# Patient Record
Sex: Female | Born: 1965 | Race: Black or African American | Hispanic: No | Smoking: Never smoker
Health system: Southern US, Community
[De-identification: ages and names within clinical notes are randomized; demographics above are authoritative.]

## PROBLEM LIST (undated history)

## (undated) DIAGNOSIS — K219 Gastro-esophageal reflux disease without esophagitis: Secondary | ICD-10-CM

## (undated) DIAGNOSIS — I5181 Takotsubo syndrome: Secondary | ICD-10-CM

## (undated) DIAGNOSIS — D649 Anemia, unspecified: Secondary | ICD-10-CM

## (undated) DIAGNOSIS — Z5189 Encounter for other specified aftercare: Secondary | ICD-10-CM

## (undated) DIAGNOSIS — I10 Essential (primary) hypertension: Secondary | ICD-10-CM

## (undated) DIAGNOSIS — I214 Non-ST elevation (NSTEMI) myocardial infarction: Secondary | ICD-10-CM

## (undated) DIAGNOSIS — I509 Heart failure, unspecified: Secondary | ICD-10-CM

## (undated) DIAGNOSIS — E785 Hyperlipidemia, unspecified: Secondary | ICD-10-CM

## (undated) DIAGNOSIS — R079 Chest pain, unspecified: Secondary | ICD-10-CM

## (undated) DIAGNOSIS — R7303 Prediabetes: Secondary | ICD-10-CM

## (undated) DIAGNOSIS — F419 Anxiety disorder, unspecified: Secondary | ICD-10-CM

## (undated) HISTORY — PX: TUBAL LIGATION: SHX77

## (undated) HISTORY — DX: Essential (primary) hypertension: I10

## (undated) HISTORY — PX: COLONOSCOPY: SHX174

## (undated) HISTORY — DX: Chest pain, unspecified: R07.9

## (undated) HISTORY — PX: BACK SURGERY: SHX140

## (undated) HISTORY — PX: ABDOMINAL HYSTERECTOMY: SHX81

## (undated) HISTORY — DX: Anxiety disorder, unspecified: F41.9

## (undated) HISTORY — DX: Anemia, unspecified: D64.9

## (undated) HISTORY — DX: Hyperlipidemia, unspecified: E78.5

## (undated) HISTORY — DX: Encounter for other specified aftercare: Z51.89

## (undated) HISTORY — DX: Takotsubo syndrome: I51.81

---

## 1988-04-08 HISTORY — PX: TUBAL LIGATION: SHX77

## 2000-04-08 HISTORY — PX: BACK SURGERY: SHX140

## 2002-08-16 ENCOUNTER — Emergency Department (HOSPITAL_COMMUNITY): Admission: AC | Admit: 2002-08-16 | Discharge: 2002-08-17 | Payer: Self-pay

## 2002-08-16 ENCOUNTER — Encounter: Payer: Self-pay | Admitting: Emergency Medicine

## 2002-08-25 ENCOUNTER — Encounter: Admission: RE | Admit: 2002-08-25 | Discharge: 2002-11-19 | Payer: Self-pay | Admitting: Specialist

## 2002-10-20 ENCOUNTER — Other Ambulatory Visit: Admission: RE | Admit: 2002-10-20 | Discharge: 2002-10-20 | Payer: Self-pay | Admitting: Obstetrics and Gynecology

## 2003-11-28 ENCOUNTER — Other Ambulatory Visit: Admission: RE | Admit: 2003-11-28 | Discharge: 2003-11-28 | Payer: Self-pay | Admitting: Obstetrics and Gynecology

## 2004-09-27 ENCOUNTER — Other Ambulatory Visit: Admission: RE | Admit: 2004-09-27 | Discharge: 2004-09-27 | Payer: Self-pay | Admitting: Obstetrics and Gynecology

## 2005-04-08 HISTORY — PX: COLONOSCOPY: SHX174

## 2005-06-04 ENCOUNTER — Encounter (HOSPITAL_COMMUNITY): Admission: RE | Admit: 2005-06-04 | Discharge: 2005-06-05 | Payer: Self-pay | Admitting: Gastroenterology

## 2005-10-16 ENCOUNTER — Ambulatory Visit: Payer: Self-pay | Admitting: Oncology

## 2005-11-05 LAB — CBC WITH DIFFERENTIAL/PLATELET
BASO%: 0.2 % (ref 0.0–2.0)
Eosinophils Absolute: 0.1 10*3/uL (ref 0.0–0.5)
HCT: 28.9 % — ABNORMAL LOW (ref 34.8–46.6)
HGB: 8.8 g/dL — ABNORMAL LOW (ref 11.6–15.9)
MCH: 19.9 pg — ABNORMAL LOW (ref 26.0–34.0)
MCHC: 30.4 g/dL — ABNORMAL LOW (ref 32.0–36.0)
MCV: 65.6 fL — ABNORMAL LOW (ref 81.0–101.0)
RBC: 4.4 10*6/uL (ref 3.70–5.32)
RDW: 18 % — ABNORMAL HIGH (ref 11.3–14.5)
lymph#: 3 10*3/uL (ref 0.9–3.3)

## 2005-11-05 LAB — FERRITIN: Ferritin: <1 ng/mL — ABNORMAL LOW (ref 10–291)

## 2005-11-05 LAB — CHCC SMEAR

## 2005-11-05 LAB — COMPREHENSIVE METABOLIC PANEL
BUN: 13 mg/dL (ref 6–23)
CO2: 25 mEq/L (ref 19–32)
Chloride: 106 mEq/L (ref 96–112)
Potassium: 3.8 mEq/L (ref 3.5–5.3)
Sodium: 137 mEq/L (ref 135–145)
Total Bilirubin: 0.3 mg/dL (ref 0.3–1.2)

## 2005-11-05 LAB — IRON AND TIBC: UIBC: 410 ug/dL

## 2005-11-05 LAB — LACTATE DEHYDROGENASE: LDH: 152 U/L (ref 94–250)

## 2005-11-06 ENCOUNTER — Other Ambulatory Visit: Admission: RE | Admit: 2005-11-06 | Discharge: 2005-11-06 | Payer: Self-pay | Admitting: Gynecology

## 2006-01-01 ENCOUNTER — Ambulatory Visit: Payer: Self-pay | Admitting: Oncology

## 2008-06-13 ENCOUNTER — Ambulatory Visit (HOSPITAL_COMMUNITY): Admission: RE | Admit: 2008-06-13 | Discharge: 2008-06-13 | Payer: Self-pay | Admitting: Obstetrics & Gynecology

## 2008-07-29 ENCOUNTER — Inpatient Hospital Stay (HOSPITAL_COMMUNITY): Admission: AD | Admit: 2008-07-29 | Discharge: 2008-07-29 | Payer: Self-pay | Admitting: Obstetrics & Gynecology

## 2008-08-12 ENCOUNTER — Inpatient Hospital Stay (HOSPITAL_COMMUNITY): Admission: AD | Admit: 2008-08-12 | Discharge: 2008-08-12 | Payer: Self-pay | Admitting: Obstetrics

## 2008-09-12 ENCOUNTER — Inpatient Hospital Stay (HOSPITAL_COMMUNITY): Admission: AD | Admit: 2008-09-12 | Discharge: 2008-09-12 | Payer: Self-pay | Admitting: Obstetrics

## 2008-10-18 ENCOUNTER — Emergency Department (HOSPITAL_COMMUNITY): Admission: EM | Admit: 2008-10-18 | Discharge: 2008-10-18 | Payer: Self-pay | Admitting: Emergency Medicine

## 2009-02-17 ENCOUNTER — Inpatient Hospital Stay (HOSPITAL_COMMUNITY): Admission: RE | Admit: 2009-02-17 | Discharge: 2009-02-20 | Payer: Self-pay | Admitting: Obstetrics

## 2009-02-17 ENCOUNTER — Encounter: Payer: Self-pay | Admitting: Obstetrics

## 2009-12-01 ENCOUNTER — Ambulatory Visit (HOSPITAL_COMMUNITY): Admission: RE | Admit: 2009-12-01 | Discharge: 2009-12-01 | Payer: Self-pay | Admitting: Obstetrics

## 2010-04-29 ENCOUNTER — Encounter: Payer: Self-pay | Admitting: Obstetrics

## 2010-07-11 LAB — CBC
HCT: 34.7 % — ABNORMAL LOW (ref 36.0–46.0)
Hemoglobin: 10.8 g/dL — ABNORMAL LOW (ref 12.0–15.0)
MCHC: 31 g/dL (ref 30.0–36.0)
MCHC: 31.2 g/dL (ref 30.0–36.0)
MCV: 74.4 fL — ABNORMAL LOW (ref 78.0–100.0)
Platelets: 400 10*3/uL (ref 150–400)
RBC: 4.56 MIL/uL (ref 3.87–5.11)
RDW: 26.3 % — ABNORMAL HIGH (ref 11.5–15.5)
WBC: 6.6 10*3/uL (ref 4.0–10.5)
WBC: 9.5 10*3/uL (ref 4.0–10.5)

## 2010-07-11 LAB — PREGNANCY, URINE: Preg Test, Ur: NEGATIVE

## 2010-10-15 ENCOUNTER — Other Ambulatory Visit: Payer: Self-pay | Admitting: Obstetrics

## 2010-10-15 DIAGNOSIS — Z1231 Encounter for screening mammogram for malignant neoplasm of breast: Secondary | ICD-10-CM

## 2010-12-04 ENCOUNTER — Ambulatory Visit (HOSPITAL_COMMUNITY): Payer: 59 | Attending: Obstetrics

## 2011-07-15 ENCOUNTER — Ambulatory Visit (HOSPITAL_COMMUNITY)
Admission: RE | Admit: 2011-07-15 | Discharge: 2011-07-15 | Disposition: A | Discharge status: Home / Self Care | Payer: 59 | Source: Ambulatory Visit | Attending: Obstetrics | Admitting: Obstetrics

## 2011-07-15 DIAGNOSIS — Z1231 Encounter for screening mammogram for malignant neoplasm of breast: Secondary | ICD-10-CM

## 2011-08-15 ENCOUNTER — Other Ambulatory Visit: Payer: Self-pay

## 2011-08-16 ENCOUNTER — Other Ambulatory Visit: Payer: Self-pay | Admitting: Obstetrics

## 2012-04-08 DIAGNOSIS — Z5189 Encounter for other specified aftercare: Secondary | ICD-10-CM

## 2012-04-08 HISTORY — DX: Encounter for other specified aftercare: Z51.89

## 2012-09-15 ENCOUNTER — Ambulatory Visit: Payer: Self-pay | Admitting: Obstetrics

## 2012-12-16 ENCOUNTER — Ambulatory Visit (INDEPENDENT_AMBULATORY_CARE_PROVIDER_SITE_OTHER): Payer: 59 | Admitting: Obstetrics

## 2012-12-16 ENCOUNTER — Encounter: Payer: Self-pay | Admitting: Obstetrics

## 2012-12-16 VITALS — BP 145/96 | HR 87 | Temp 97.5°F | Ht 68.0 in | Wt 279.0 lb

## 2012-12-16 DIAGNOSIS — N760 Acute vaginitis: Secondary | ICD-10-CM

## 2012-12-16 DIAGNOSIS — B9689 Other specified bacterial agents as the cause of diseases classified elsewhere: Secondary | ICD-10-CM

## 2012-12-16 DIAGNOSIS — Z01419 Encounter for gynecological examination (general) (routine) without abnormal findings: Secondary | ICD-10-CM

## 2012-12-16 DIAGNOSIS — Z1239 Encounter for other screening for malignant neoplasm of breast: Secondary | ICD-10-CM

## 2012-12-16 DIAGNOSIS — F419 Anxiety disorder, unspecified: Secondary | ICD-10-CM

## 2012-12-16 DIAGNOSIS — Z113 Encounter for screening for infections with a predominantly sexual mode of transmission: Secondary | ICD-10-CM

## 2012-12-16 DIAGNOSIS — F411 Generalized anxiety disorder: Secondary | ICD-10-CM

## 2012-12-16 DIAGNOSIS — A499 Bacterial infection, unspecified: Secondary | ICD-10-CM

## 2012-12-16 DIAGNOSIS — E669 Obesity, unspecified: Secondary | ICD-10-CM

## 2012-12-16 MED ORDER — METRONIDAZOLE 0.75 % VA GEL: 1.0000 | Freq: Two times a day (BID) | VAGINAL | Status: DC

## 2012-12-16 MED ORDER — ALPRAZOLAM 0.5 MG PO TABS: 0.5000 mg | ORAL_TABLET | Freq: Two times a day (BID) | ORAL | Status: DC | PRN

## 2012-12-16 MED ORDER — PHENTERMINE HCL 37.5 MG PO CAPS: 37.5000 mg | ORAL_CAPSULE | ORAL | Status: DC

## 2012-12-16 NOTE — Progress Notes (Signed)
Subjective:     Diana Campos is a 47 y.o. female here for a routine exam.  Current complaints: annual exam.  Personal health questionnaire reviewed: yes.   Gynecologic History No LMP recorded. Patient has had a hysterectomy. Contraception: none Last Pap: 07/2011. Results were: abnormal LGSIL Last mammogram: 2012. Results were: normal  Obstetric History OB History  No data available     The following portions of the patient's history were reviewed and updated as appropriate: allergies, current medications, past family history, past medical history, past social history, past surgical history and problem list.  Review of Systems Pertinent items are noted in HPI.    Objective:    General appearance: alert and no distress Breasts: normal appearance, no masses or tenderness Abdomen: normal findings: soft, non-tender Pelvic: cervix normal in appearance, external genitalia normal, no adnexal masses or tenderness, no cervical motion tenderness, uterus surgically absent and vagina normal without discharge Extremities: extremities normal, atraumatic, no cyanosis or edema    Assessment:    Healthy female exam.   S/P Supracervical Hysterectomy for fibroids.  H/O BV  Anxiety   Follow up in: 1 year.

## 2012-12-16 NOTE — Addendum Note (Signed)
Addended by: Coral Ceo A on: 12/16/2012 01:27 PM   Modules accepted: Orders

## 2012-12-17 LAB — RPR

## 2012-12-17 LAB — HEPATITIS C ANTIBODY: HCV Ab: NEGATIVE

## 2012-12-17 LAB — HIV ANTIBODY (ROUTINE TESTING W REFLEX): HIV: NONREACTIVE

## 2012-12-28 ENCOUNTER — Other Ambulatory Visit (HOSPITAL_COMMUNITY): Payer: Self-pay | Admitting: Obstetrics

## 2012-12-28 DIAGNOSIS — Z1239 Encounter for other screening for malignant neoplasm of breast: Secondary | ICD-10-CM

## 2013-01-07 ENCOUNTER — Other Ambulatory Visit: Payer: Self-pay | Admitting: *Deleted

## 2013-01-07 DIAGNOSIS — B373 Candidiasis of vulva and vagina: Secondary | ICD-10-CM

## 2013-01-07 MED ORDER — FLUCONAZOLE 150 MG PO TABS: 150.0000 mg | ORAL_TABLET | Freq: Once | ORAL | Status: DC

## 2013-01-12 ENCOUNTER — Encounter: Payer: Self-pay | Admitting: Obstetrics

## 2013-01-15 ENCOUNTER — Ambulatory Visit (HOSPITAL_COMMUNITY): Payer: 59

## 2013-01-22 ENCOUNTER — Ambulatory Visit (HOSPITAL_COMMUNITY)
Admission: RE | Admit: 2013-01-22 | Discharge: 2013-01-22 | Disposition: A | Discharge status: Home / Self Care | Payer: 59 | Source: Ambulatory Visit | Attending: Obstetrics | Admitting: Obstetrics

## 2013-01-22 DIAGNOSIS — Z1231 Encounter for screening mammogram for malignant neoplasm of breast: Secondary | ICD-10-CM | POA: Insufficient documentation

## 2013-01-22 DIAGNOSIS — Z1239 Encounter for other screening for malignant neoplasm of breast: Secondary | ICD-10-CM

## 2013-08-27 ENCOUNTER — Other Ambulatory Visit: Payer: Self-pay | Admitting: Obstetrics

## 2013-09-05 ENCOUNTER — Other Ambulatory Visit: Payer: Self-pay | Admitting: Obstetrics

## 2013-12-07 ENCOUNTER — Ambulatory Visit (INDEPENDENT_AMBULATORY_CARE_PROVIDER_SITE_OTHER): Payer: 59 | Admitting: Obstetrics

## 2013-12-07 ENCOUNTER — Encounter: Payer: Self-pay | Admitting: Obstetrics

## 2013-12-07 ENCOUNTER — Other Ambulatory Visit: Payer: Self-pay | Admitting: Obstetrics

## 2013-12-07 VITALS — BP 134/86 | HR 77 | Temp 98.2°F | Ht 68.0 in | Wt 276.0 lb

## 2013-12-07 DIAGNOSIS — N760 Acute vaginitis: Secondary | ICD-10-CM

## 2013-12-07 DIAGNOSIS — B9689 Other specified bacterial agents as the cause of diseases classified elsewhere: Secondary | ICD-10-CM

## 2013-12-07 DIAGNOSIS — Z113 Encounter for screening for infections with a predominantly sexual mode of transmission: Secondary | ICD-10-CM

## 2013-12-07 DIAGNOSIS — Z01419 Encounter for gynecological examination (general) (routine) without abnormal findings: Secondary | ICD-10-CM

## 2013-12-07 MED ORDER — METRONIDAZOLE 0.75 % VA GEL: 1.0000 | Freq: Two times a day (BID) | VAGINAL | Status: DC

## 2013-12-08 ENCOUNTER — Encounter: Payer: Self-pay | Admitting: Obstetrics

## 2013-12-08 LAB — GC/CHLAMYDIA PROBE AMP
CT Probe RNA: NEGATIVE
GC PROBE AMP APTIMA: NEGATIVE

## 2013-12-08 LAB — WET PREP BY MOLECULAR PROBE
CANDIDA SPECIES: NEGATIVE
Gardnerella vaginalis: NEGATIVE
TRICHOMONAS VAG: NEGATIVE

## 2013-12-08 LAB — RPR

## 2013-12-08 LAB — HIV ANTIBODY (ROUTINE TESTING W REFLEX): HIV: NONREACTIVE

## 2013-12-08 LAB — HEPATITIS C ANTIBODY: HCV Ab: NEGATIVE

## 2013-12-08 LAB — HEPATITIS B SURFACE ANTIGEN: Hepatitis B Surface Ag: NEGATIVE

## 2013-12-08 NOTE — Progress Notes (Signed)
Subjective:     Diana Campos is a 48 y.o. female here for a routine exam.  Current complaints: none.    Personal health questionnaire:  Is patient Ashkenazi Jewish, have a family history of breast and/or ovarian cancer: no Is there a family history of uterine cancer diagnosed at age < 54, gastrointestinal cancer, urinary tract cancer, family member who is a Field seismologist syndrome-associated carrier: no Is the patient overweight and hypertensive, family history of diabetes, personal history of gestational diabetes or PCOS: no Is patient over 36, have PCOS,  family history of premature CHD under age 26, diabetes, smoke, have hypertension or peripheral artery disease:  no At any time, has a partner hit, kicked or otherwise hurt or frightened you?: no Over the past 2 weeks, have you felt down, depressed or hopeless?: no Over the past 2 weeks, have you felt little interest or pleasure in doing things?:no   Gynecologic History No LMP recorded. Patient has had a hysterectomy. Contraception: tubal ligation Last Pap: 2014. Results were: normal Last mammogram: 2014. Results were: normal  Obstetric History OB History  No data available    History reviewed. No pertinent past medical history.  Past Surgical History  Procedure Laterality Date  . Back surgery    . Abdominal hysterectomy    . Tubal ligation      Current outpatient prescriptions:ALPRAZolam (XANAX) 0.5 MG tablet, Take 1 tablet (0.5 mg total) by mouth 3 times/day as needed-between meals & bedtime for sleep., Disp: 60 tablet, Rfl: 4;  metroNIDAZOLE (METROGEL VAGINAL) 0.75 % vaginal gel, Place 1 Applicatorful vaginally 2 (two) times daily., Disp: 70 g, Rfl: 4 No Known Allergies  History  Substance Use Topics  . Smoking status: Never Smoker   . Smokeless tobacco: Not on file  . Alcohol Use: No    Family History  Problem Relation Age of Onset  . Hypertension Mother   . Cancer Paternal Grandmother       Review of  Systems  Constitutional: negative for fatigue and weight loss Respiratory: negative for cough and wheezing Cardiovascular: negative for chest pain, fatigue and palpitations Gastrointestinal: negative for abdominal pain and change in bowel habits Musculoskeletal:negative for myalgias Neurological: negative for gait problems and tremors Behavioral/Psych: negative for abusive relationship, depression Endocrine: negative for temperature intolerance   Genitourinary:negative for abnormal menstrual periods, genital lesions, hot flashes, sexual problems and vaginal discharge Integument/breast: negative for breast lump, breast tenderness, nipple discharge and skin lesion(s)    Objective:       BP 134/86  Pulse 77  Temp(Src) 98.2 F (36.8 C)  Ht 5\' 8"  (1.727 m)  Wt 276 lb (125.193 kg)  BMI 41.98 kg/m2 General:   alert  Skin:   no rash or abnormalities  Lungs:   clear to auscultation bilaterally  Heart:   regular rate and rhythm, S1, S2 normal, no murmur, click, rub or gallop  Breasts:   normal without suspicious masses, skin or nipple changes or axillary nodes  Abdomen:  normal findings: no organomegaly, soft, non-tender and no hernia  Pelvis:  External genitalia: normal general appearance Urinary system: urethral meatus normal and bladder without fullness, nontender Vaginal: normal without tenderness, induration or masses Cervix: normal appearance Adnexa: normal bimanual exam Uterus: anteverted and non-tender, normal size   Lab Review Urine pregnancy test Labs reviewed yes Radiologic studies reviewed yes  Assessment:    Healthy female exam.    Plan:    Education reviewed: calcium supplements, low fat, low cholesterol diet, safe sex/STD  prevention, self breast exams and weight bearing exercise. Follow up in: 1 year.   Meds ordered this encounter  Medications  . metroNIDAZOLE (METROGEL VAGINAL) 0.75 % vaginal gel    Sig: Place 1 Applicatorful vaginally 2 (two) times daily.     Dispense:  70 g    Refill:  4   Orders Placed This Encounter  Procedures  . WET PREP BY MOLECULAR PROBE  . GC/Chlamydia Probe Amp  . HIV antibody  . Hepatitis B surface antigen  . RPR  . Hepatitis C antibody

## 2013-12-09 LAB — PAP IG AND HPV HIGH-RISK: HPV DNA HIGH RISK: NOT DETECTED

## 2014-04-08 HISTORY — PX: ABDOMINAL HYSTERECTOMY: SHX81

## 2014-05-27 ENCOUNTER — Telehealth: Payer: Self-pay | Admitting: *Deleted

## 2014-05-27 DIAGNOSIS — B373 Candidiasis of vulva and vagina: Secondary | ICD-10-CM

## 2014-05-27 DIAGNOSIS — B3731 Acute candidiasis of vulva and vagina: Secondary | ICD-10-CM

## 2014-05-27 MED ORDER — FLUCONAZOLE 150 MG PO TABS: 150.0000 mg | ORAL_TABLET | ORAL | Status: DC

## 2014-05-27 NOTE — Telephone Encounter (Signed)
Patient called and stated she lives in [place] now and she is having a horrible yeast flare. Can we please send something to her pharmacy. OK to treat per Dr Jodi Mourning. Patient notified per VM.

## 2014-07-14 ENCOUNTER — Other Ambulatory Visit: Payer: Self-pay | Admitting: Obstetrics

## 2014-07-14 DIAGNOSIS — Z1231 Encounter for screening mammogram for malignant neoplasm of breast: Secondary | ICD-10-CM

## 2014-07-21 ENCOUNTER — Ambulatory Visit (INDEPENDENT_AMBULATORY_CARE_PROVIDER_SITE_OTHER): Payer: 59 | Admitting: Obstetrics

## 2014-07-21 ENCOUNTER — Ambulatory Visit (HOSPITAL_COMMUNITY)
Admission: RE | Admit: 2014-07-21 | Discharge: 2014-07-21 | Disposition: A | Discharge status: Home / Self Care | Payer: 59 | Source: Ambulatory Visit | Attending: Obstetrics | Admitting: Obstetrics

## 2014-07-21 VITALS — BP 151/98 | HR 86 | Wt 283.0 lb

## 2014-07-21 DIAGNOSIS — Z0000 Encounter for general adult medical examination without abnormal findings: Secondary | ICD-10-CM

## 2014-07-21 DIAGNOSIS — R399 Unspecified symptoms and signs involving the genitourinary system: Secondary | ICD-10-CM

## 2014-07-21 DIAGNOSIS — R102 Pelvic and perineal pain: Secondary | ICD-10-CM | POA: Diagnosis not present

## 2014-07-21 DIAGNOSIS — M545 Low back pain, unspecified: Secondary | ICD-10-CM

## 2014-07-21 DIAGNOSIS — Z1231 Encounter for screening mammogram for malignant neoplasm of breast: Secondary | ICD-10-CM | POA: Insufficient documentation

## 2014-07-21 DIAGNOSIS — R351 Nocturia: Secondary | ICD-10-CM

## 2014-07-21 LAB — COMPREHENSIVE METABOLIC PANEL
ALT: 15 U/L (ref 0–35)
AST: 18 U/L (ref 0–37)
Albumin: 4 g/dL (ref 3.5–5.2)
Alkaline Phosphatase: 65 U/L (ref 39–117)
BUN: 11 mg/dL (ref 6–23)
CHLORIDE: 103 meq/L (ref 96–112)
CO2: 25 meq/L (ref 19–32)
CREATININE: 0.76 mg/dL (ref 0.50–1.10)
Calcium: 9.2 mg/dL (ref 8.4–10.5)
Glucose, Bld: 80 mg/dL (ref 70–99)
Potassium: 4 mEq/L (ref 3.5–5.3)
Sodium: 140 mEq/L (ref 135–145)
Total Bilirubin: 0.5 mg/dL (ref 0.2–1.2)
Total Protein: 7.1 g/dL (ref 6.0–8.3)

## 2014-07-21 LAB — POCT URINALYSIS DIPSTICK
BILIRUBIN UA: NEGATIVE
Glucose, UA: NEGATIVE
KETONES UA: NEGATIVE
Nitrite, UA: NEGATIVE
Protein, UA: NEGATIVE
SPEC GRAV UA: 1.01
Urobilinogen, UA: NEGATIVE
pH, UA: 7

## 2014-07-21 LAB — LIPID PANEL
Cholesterol: 201 mg/dL — ABNORMAL HIGH (ref 0–200)
HDL: 34 mg/dL — ABNORMAL LOW (ref >=46)
LDL Cholesterol: 150 mg/dL — ABNORMAL HIGH (ref 0–99)
Total CHOL/HDL Ratio: 5.9 Ratio
Triglycerides: 87 mg/dL (ref <150)
VLDL: 17 mg/dL (ref 0–40)

## 2014-07-21 LAB — TSH: TSH: 2.25 u[IU]/mL (ref 0.350–4.500)

## 2014-07-22 ENCOUNTER — Encounter: Payer: Self-pay | Admitting: Obstetrics

## 2014-07-22 LAB — CBC
HCT: 43.5 % (ref 36.0–46.0)
Hemoglobin: 14.6 g/dL (ref 12.0–15.0)
MCH: 30.1 pg (ref 26.0–34.0)
MCHC: 33.6 g/dL (ref 30.0–36.0)
MCV: 89.7 fL (ref 78.0–100.0)
MPV: 9.5 fL (ref 8.6–12.4)
Platelets: 305 10*3/uL (ref 150–400)
RBC: 4.85 MIL/uL (ref 3.87–5.11)
RDW: 14 % (ref 11.5–15.5)
WBC: 6.5 10*3/uL (ref 4.0–10.5)

## 2014-07-22 LAB — URINE CULTURE
Colony Count: NO GROWTH
ORGANISM ID, BACTERIA: NO GROWTH

## 2014-07-22 LAB — HEMOGLOBIN A1C
HEMOGLOBIN A1C: 5.9 % — AB (ref <5.7)
MEAN PLASMA GLUCOSE: 123 mg/dL — AB (ref <117)

## 2014-07-22 NOTE — Progress Notes (Signed)
Patient ID: Diana Campos, female   DOB: 13-Feb-1966, 49 y.o.   MRN: [id number]  Chief Complaint  Patient presents with  . Cystitis    possible uti/bladder inf    HPI Diana Campos is a 49 y.o. female.  Presents with H/O urinating every 2 hours at night.  Denies dysuria or incontinence.  HPI  History reviewed. No pertinent past medical history.  Past Surgical History  Procedure Laterality Date  . Back surgery    . Abdominal hysterectomy    . Tubal ligation      Family History  Problem Relation Age of Onset  . Hypertension Mother   . Cancer Paternal Grandmother     Social History History  Substance Use Topics  . Smoking status: Never Smoker   . Smokeless tobacco: Not on file  . Alcohol Use: No    No Known Allergies  Current Outpatient Prescriptions  Medication Sig Dispense Refill  . ALPRAZolam (XANAX) 0.5 MG tablet Take 1 tablet (0.5 mg total) by mouth 3 times/day as needed-between meals & bedtime for sleep. (Patient not taking: Reported on 07/21/2014) 60 tablet 4   No current facility-administered medications for this visit.    Review of Systems Review of Systems Constitutional: negative for fatigue and weight loss Respiratory: negative for cough and wheezing Cardiovascular: negative for chest pain, fatigue and palpitations Gastrointestinal: negative for abdominal pain and change in bowel habits Genitourinary: positive for nocturia Integument/breast: negative for nipple discharge Musculoskeletal:negative for myalgias Neurological: negative for gait problems and tremors Behavioral/Psych: negative for abusive relationship, depression Endocrine: negative for temperature intolerance     Blood pressure 151/98, pulse 86, weight 283 lb (128.368 kg).  Physical Exam Physical Exam                Abdomen:  normal findings: no organomegaly, soft, non-tender and no hernia  Pelvis:  External genitalia: normal general appearance Urinary system: urethral meatus normal  and bladder without fullness, nontender Vaginal: normal without tenderness, induration or masses Cervix: normal appearance Adnexa: normal bimanual exam Uterus: absent      Data Reviewed Labs  Assessment     Nocturia  S/P Supracervical Hysterectomy     Plan    Urine culture Ultrasound ordered Will probably need referral to Urology F/U 2 months   Orders Placed This Encounter  Procedures  . Urine culture  . SureSwab, Vaginosis/Vaginitis Plus  . US Transvaginal Non-OB    Standing Status: Future     Number of Occurrences:      Standing Expiration Date: 09/20/2015    Order Specific Question:  Reason for Exam (SYMPTOM  OR DIAGNOSIS REQUIRED)    Answer:  pelvic pain    Order Specific Question:  Preferred imaging location?    Answer:  [facility]  . US Pelvis Complete    Standing Status: Future     Number of Occurrences:      Standing Expiration Date: 09/20/2015    Order Specific Question:  Reason for Exam (SYMPTOM  OR DIAGNOSIS REQUIRED)    Answer:  pelvic pain    Order Specific Question:  Preferred imaging location?    Answer:  [facility]  . Comprehensive metabolic panel  . TSH  . CBC  . Hemoglobin A1c  . Lipid Profile  . POCT urinalysis dipstick   No orders of the defined types were placed in this encounter.

## 2014-07-25 ENCOUNTER — Other Ambulatory Visit: Payer: Self-pay | Admitting: Obstetrics

## 2014-07-25 DIAGNOSIS — N760 Acute vaginitis: Principal | ICD-10-CM

## 2014-07-25 DIAGNOSIS — B9689 Other specified bacterial agents as the cause of diseases classified elsewhere: Secondary | ICD-10-CM

## 2014-07-25 LAB — SURESWAB, VAGINOSIS/VAGINITIS PLUS
ATOPOBIUM VAGINAE: 6.8 Log (cells/mL)
BV CATEGORY: UNDETERMINED — AB
C. ALBICANS, DNA: NOT DETECTED
C. TRACHOMATIS RNA, TMA: NOT DETECTED
C. glabrata, DNA: NOT DETECTED
C. parapsilosis, DNA: NOT DETECTED
C. tropicalis, DNA: NOT DETECTED
LACTOBACILLUS SPECIES: 6.9 Log (cells/mL)
N. gonorrhoeae RNA, TMA: NOT DETECTED
T. VAGINALIS RNA, QL TMA: NOT DETECTED

## 2014-07-25 MED ORDER — TINIDAZOLE 500 MG PO TABS: 1000.0000 mg | ORAL_TABLET | Freq: Every day | ORAL | Status: DC

## 2014-07-26 ENCOUNTER — Ambulatory Visit (HOSPITAL_COMMUNITY)
Admission: RE | Admit: 2014-07-26 | Discharge: 2014-07-26 | Disposition: A | Discharge status: Home / Self Care | Payer: 59 | Source: Ambulatory Visit | Attending: Obstetrics | Admitting: Obstetrics

## 2014-07-26 DIAGNOSIS — Z90710 Acquired absence of both cervix and uterus: Secondary | ICD-10-CM | POA: Diagnosis not present

## 2014-07-26 DIAGNOSIS — R102 Pelvic and perineal pain: Secondary | ICD-10-CM | POA: Insufficient documentation

## 2014-07-27 ENCOUNTER — Encounter: Payer: Self-pay | Admitting: *Deleted

## 2014-07-27 ENCOUNTER — Other Ambulatory Visit: Payer: Self-pay | Admitting: *Deleted

## 2014-07-27 DIAGNOSIS — B9689 Other specified bacterial agents as the cause of diseases classified elsewhere: Secondary | ICD-10-CM

## 2014-07-27 DIAGNOSIS — N760 Acute vaginitis: Principal | ICD-10-CM

## 2014-07-27 MED ORDER — METRONIDAZOLE 500 MG PO TABS: 500.0000 mg | ORAL_TABLET | Freq: Two times a day (BID) | ORAL | Status: DC

## 2014-07-27 NOTE — Progress Notes (Signed)
See lab note. Change in Rx

## 2014-08-04 ENCOUNTER — Ambulatory Visit: Payer: 59 | Admitting: Obstetrics

## 2014-08-31 ENCOUNTER — Other Ambulatory Visit: Payer: Self-pay | Admitting: Obstetrics

## 2014-08-31 DIAGNOSIS — N884 Hypertrophic elongation of cervix uteri: Secondary | ICD-10-CM

## 2014-08-31 DIAGNOSIS — R102 Pelvic and perineal pain: Secondary | ICD-10-CM

## 2014-08-31 DIAGNOSIS — N7011 Chronic salpingitis: Secondary | ICD-10-CM

## 2014-09-01 ENCOUNTER — Other Ambulatory Visit: Payer: Self-pay | Admitting: Obstetrics

## 2014-09-01 DIAGNOSIS — N7011 Chronic salpingitis: Secondary | ICD-10-CM

## 2014-09-01 DIAGNOSIS — R102 Pelvic and perineal pain unspecified side: Secondary | ICD-10-CM

## 2014-09-01 DIAGNOSIS — N939 Abnormal uterine and vaginal bleeding, unspecified: Secondary | ICD-10-CM

## 2014-09-02 ENCOUNTER — Telehealth: Payer: Self-pay

## 2014-09-02 NOTE — Telephone Encounter (Signed)
Called patient with appt with Dr. Maryland Pink - 09/28/14 at 3:45pm in her [facility] office - they will send  her a new patient packet - they also put her on cancellation list to get in sooner if possible

## 2014-09-02 NOTE — Telephone Encounter (Signed)
Called patient with appt with Dr. Maryland Pink - 09/28/14 at 3:45pm in her [facility] office - they will send  her a new patient packet - they also put her on cancellation list to get in sooner if possible

## 2014-10-24 HISTORY — PX: COLPORRHAPHY: SHX921

## 2014-10-24 HISTORY — PX: TRACHELECTOMY: SHX6586

## 2014-12-21 ENCOUNTER — Other Ambulatory Visit: Payer: Self-pay | Admitting: Obstetrics

## 2014-12-21 ENCOUNTER — Telehealth: Payer: Self-pay | Admitting: *Deleted

## 2014-12-21 DIAGNOSIS — B9689 Other specified bacterial agents as the cause of diseases classified elsewhere: Secondary | ICD-10-CM

## 2014-12-21 DIAGNOSIS — N760 Acute vaginitis: Principal | ICD-10-CM

## 2014-12-21 MED ORDER — METRONIDAZOLE 0.75 % VA GEL: 1.0000 | Freq: Two times a day (BID) | VAGINAL | Status: DC

## 2014-12-21 NOTE — Telephone Encounter (Signed)
MetroGel Rx 

## 2014-12-21 NOTE — Telephone Encounter (Signed)
Patient is requesting a refill of her Metrogel. She let her refills expire.

## 2014-12-22 NOTE — Telephone Encounter (Signed)
LM on VM- Rx at patient pharmacy

## 2015-06-19 ENCOUNTER — Ambulatory Visit: Payer: 59 | Admitting: Obstetrics

## 2015-09-21 ENCOUNTER — Encounter (HOSPITAL_COMMUNITY): Payer: Self-pay | Admitting: *Deleted

## 2015-11-10 ENCOUNTER — Other Ambulatory Visit: Payer: Self-pay | Admitting: Obstetrics

## 2015-11-10 DIAGNOSIS — N390 Urinary tract infection, site not specified: Secondary | ICD-10-CM

## 2015-11-10 MED ORDER — NITROFURANTOIN MONOHYD MACRO 100 MG PO CAPS: 100.0000 mg | ORAL_CAPSULE | Freq: Two times a day (BID) | ORAL | 2 refills | Status: DC

## 2015-11-28 ENCOUNTER — Encounter: Payer: Self-pay | Admitting: Obstetrics

## 2015-11-28 ENCOUNTER — Ambulatory Visit (INDEPENDENT_AMBULATORY_CARE_PROVIDER_SITE_OTHER): Payer: 59 | Admitting: Obstetrics

## 2015-11-28 VITALS — BP 131/90 | HR 89 | Ht 68.0 in | Wt 285.5 lb

## 2015-11-28 DIAGNOSIS — Z01419 Encounter for gynecological examination (general) (routine) without abnormal findings: Secondary | ICD-10-CM | POA: Diagnosis not present

## 2015-11-28 DIAGNOSIS — Z0000 Encounter for general adult medical examination without abnormal findings: Secondary | ICD-10-CM

## 2015-11-28 DIAGNOSIS — Z113 Encounter for screening for infections with a predominantly sexual mode of transmission: Secondary | ICD-10-CM

## 2015-11-28 NOTE — Progress Notes (Signed)
Patient ID: Diana Campos, female   DOB: 04-Jan-1966, 50 y.o.   MRN: [id number]   Subjective:        Diana Campos is a 50 y.o. female here for a routine exam.  Current complaints: None.  S/P supracervical hysterectomy several years ago.  Recently started having dyspareunia, and a trachelectomy was done.  Personal health questionnaire:  Is patient Diana Campos, have a family history of breast and/or ovarian cancer: no Is there a family history of uterine cancer diagnosed at age < 67, gastrointestinal cancer, urinary tract cancer, family member who is a Field seismologist syndrome-associated carrier: no Is the patient overweight and hypertensive, family history of diabetes, personal history of gestational diabetes, preeclampsia or PCOS: no Is patient over 63, have PCOS,  family history of premature CHD under age 15, diabetes, smoke, have hypertension or peripheral artery disease:  no At any time, has a partner hit, kicked or otherwise hurt or frightened you?: no Over the past 2 weeks, have you felt down, depressed or hopeless?: no Over the past 2 weeks, have you felt little interest or pleasure in doing things?:no   Gynecologic History No LMP recorded. Patient has had a hysterectomy. Contraception: status post hysterectomy Last Pap: 2015. Results were: normal Last mammogram: 2014. Results were: normal  Obstetric History OB History  No data available    No past medical history on file.  Past Surgical History:  Procedure Laterality Date  . ABDOMINAL HYSTERECTOMY    . BACK SURGERY    . TUBAL LIGATION       Current Outpatient Prescriptions:  .  amitriptyline (ELAVIL) 10 MG tablet, Take 10 mg by mouth at bedtime., Disp: , Rfl:  .  ALPRAZolam (XANAX) 0.5 MG tablet, Take 1 tablet (0.5 mg total) by mouth 3 times/day as needed-between meals & bedtime for sleep. (Patient not taking: Reported on 07/21/2014), Disp: 60 tablet, Rfl: 4 No Known Allergies  Social History  Substance Use Topics  .  Smoking status: Never Smoker  . Smokeless tobacco: Never Used  . Alcohol use No    Family History  Problem Relation Age of Onset  . Hypertension Mother   . Cancer Paternal Grandmother       Review of Systems  Constitutional: negative for fatigue and weight loss Respiratory: negative for cough and wheezing Cardiovascular: negative for chest pain, fatigue and palpitations Gastrointestinal: negative for abdominal pain and change in bowel habits Musculoskeletal:negative for myalgias Neurological: negative for gait problems and tremors Behavioral/Psych: negative for abusive relationship, depression Endocrine: negative for temperature intolerance   Genitourinary:negative for abnormal menstrual periods, genital lesions, hot flashes, sexual problems and vaginal discharge Integument/breast: negative for breast lump, breast tenderness, nipple discharge and skin lesion(s)    Objective:       BP 131/90   Pulse 89   Ht 5\' 8"  (1.727 m)   Wt 285 lb 8 oz (129.5 kg)   BMI 43.41 kg/m  General:   alert  Skin:   no rash or abnormalities  Lungs:   clear to auscultation bilaterally  Heart:   regular rate and rhythm, S1, S2 normal, no murmur, click, rub or gallop  Breasts:   normal without suspicious masses, skin or nipple changes or axillary nodes  Abdomen:  normal findings: obese, no organomegaly, soft, non-tender and no hernia  Pelvis:  External genitalia: normal general appearance Urinary system: urethral meatus normal and bladder without fullness, but there is tenderness of bladder to deep palpation on bimanual exam Vaginal: normal without  tenderness, induration or masses Cervix: absent Adnexa: absent Uterus: absent   Lab Review Urine pregnancy test Labs reviewed yes Radiologic studies reviewed yes    Assessment:    Healthy female exam.    Persistent vaginal discomfort after supracervical hysterectomy followed by trachelectomy.  Possibly some degree of IC causing discomfort    Plan:     Patient to make F/U appointment with Dr, Diana Campos  Education reviewed: calcium supplements, low fat, low cholesterol diet, safe sex/STD prevention, self breast exams and weight bearing exercise. Follow up in: 1 year.   Meds ordered this encounter  Medications  . amitriptyline (ELAVIL) 10 MG tablet    Sig: Take 10 mg by mouth at bedtime.   No orders of the defined types were placed in this encounter.     Patient ID: Diana Campos, female   DOB: 05-10-1965, 50 y.o.   MRN: [id number]

## 2015-11-28 NOTE — Addendum Note (Signed)
Addended by: Lewie Loron D on: 11/28/2015 03:42 PM   Modules accepted: Orders

## 2015-11-29 LAB — RPR: RPR Ser Ql: NONREACTIVE

## 2015-11-29 LAB — HEPATITIS B SURFACE ANTIGEN: Hepatitis B Surface Ag: NEGATIVE

## 2015-11-29 LAB — HEPATITIS C ANTIBODY: Hep C Virus Ab: <0.1 s/co ratio (ref 0.0–0.9)

## 2015-11-29 LAB — HIV ANTIBODY (ROUTINE TESTING W REFLEX): HIV Screen 4th Generation wRfx: NONREACTIVE

## 2015-12-01 LAB — NUSWAB VG, CANDIDA 6SP
CANDIDA TROPICALIS, NAA: NEGATIVE
Candida albicans, NAA: NEGATIVE
Candida glabrata, NAA: NEGATIVE
Candida krusei, NAA: NEGATIVE
Candida lusitaniae, NAA: NEGATIVE
Candida parapsilosis, NAA: NEGATIVE
TRICH VAG BY NAA: NEGATIVE

## 2015-12-15 ENCOUNTER — Encounter: Payer: Self-pay | Admitting: Obstetrics

## 2016-02-21 ENCOUNTER — Telehealth: Payer: Self-pay | Admitting: *Deleted

## 2016-02-21 ENCOUNTER — Other Ambulatory Visit: Payer: Self-pay | Admitting: Obstetrics

## 2016-02-21 DIAGNOSIS — B373 Candidiasis of vulva and vagina: Secondary | ICD-10-CM

## 2016-02-21 DIAGNOSIS — B9689 Other specified bacterial agents as the cause of diseases classified elsewhere: Secondary | ICD-10-CM

## 2016-02-21 DIAGNOSIS — B3731 Acute candidiasis of vulva and vagina: Secondary | ICD-10-CM

## 2016-02-21 DIAGNOSIS — N760 Acute vaginitis: Principal | ICD-10-CM

## 2016-02-21 MED ORDER — FLUCONAZOLE 150 MG PO TABS: 150.0000 mg | ORAL_TABLET | Freq: Once | ORAL | 2 refills | Status: DC

## 2016-02-21 MED ORDER — METRONIDAZOLE 0.75 % VA GEL: 1.0000 | Freq: Two times a day (BID) | VAGINAL | 2 refills | Status: DC

## 2016-02-21 NOTE — Telephone Encounter (Signed)
Patient is calling to request Rx for Metrogel and Diflucan. She would like her Rx sent to [place]

## 2016-02-21 NOTE — Telephone Encounter (Signed)
MetroGel and Diflucan Rx

## 2016-06-20 ENCOUNTER — Other Ambulatory Visit: Payer: Self-pay | Admitting: Obstetrics

## 2016-06-20 DIAGNOSIS — B373 Candidiasis of vulva and vagina: Secondary | ICD-10-CM

## 2016-06-20 DIAGNOSIS — B3731 Acute candidiasis of vulva and vagina: Secondary | ICD-10-CM

## 2016-07-02 ENCOUNTER — Telehealth: Payer: Self-pay | Admitting: *Deleted

## 2016-07-02 ENCOUNTER — Other Ambulatory Visit: Payer: Self-pay | Admitting: Obstetrics

## 2016-07-02 DIAGNOSIS — B9689 Other specified bacterial agents as the cause of diseases classified elsewhere: Secondary | ICD-10-CM

## 2016-07-02 DIAGNOSIS — N760 Acute vaginitis: Principal | ICD-10-CM

## 2016-07-02 MED ORDER — METRONIDAZOLE 0.75 % VA GEL: 1.0000 | Freq: Two times a day (BID) | VAGINAL | 2 refills | Status: DC

## 2016-07-02 NOTE — Telephone Encounter (Signed)
MetroGel Rx for BV 

## 2016-07-02 NOTE — Telephone Encounter (Signed)
Patient is calling to request BV treatment. She was able to refill her yeast medication- but not the BV  Medication. She is having odor with light discharge.

## 2016-10-28 ENCOUNTER — Ambulatory Visit (INDEPENDENT_AMBULATORY_CARE_PROVIDER_SITE_OTHER): Payer: 59 | Admitting: Obstetrics

## 2016-10-28 ENCOUNTER — Encounter: Payer: Self-pay | Admitting: Obstetrics

## 2016-10-28 VITALS — BP 129/85 | HR 84 | Wt 273.8 lb

## 2016-10-28 DIAGNOSIS — Z6841 Body Mass Index (BMI) 40.0 and over, adult: Secondary | ICD-10-CM

## 2016-10-28 MED ORDER — TOPIRAMATE 50 MG PO TABS: 50.0000 mg | ORAL_TABLET | Freq: Every day | ORAL | 5 refills | Status: DC

## 2016-10-28 MED ORDER — PHENTERMINE HCL 37.5 MG PO CAPS: 37.5000 mg | ORAL_CAPSULE | ORAL | 2 refills | Status: DC

## 2016-10-28 NOTE — Progress Notes (Signed)
Patient wants to discuss weight loss management- she was going to another doctor and she is getting ready to be out of network. Patient will be due her annual exam in August.

## 2016-10-28 NOTE — Progress Notes (Signed)
Patient ID: Diana Campos, female   DOB: 01-Jan-1966, 51 y.o.   MRN: [id number]  Chief Complaint  Patient presents with  . Advice Only    HPI Diana Campos is a 51 y.o. female.  Patient concerned about her excessive weight gain.  Would like to discuss medical management of obesity. HPI  History reviewed. No pertinent past medical history.  Past Surgical History:  Procedure Laterality Date  . ABDOMINAL HYSTERECTOMY    . BACK SURGERY    . TUBAL LIGATION      Family History  Problem Relation Age of Onset  . Hypertension Mother   . Cancer Paternal Grandmother     Social History Social History  Substance Use Topics  . Smoking status: Never Smoker  . Smokeless tobacco: Never Used  . Alcohol use No    No Known Allergies  Current Outpatient Prescriptions  Medication Sig Dispense Refill  . phentermine 37.5 MG capsule Take 1 capsule (37.5 mg total) by mouth every morning. 30 capsule 2  . topiramate (TOPAMAX) 50 MG tablet Take 1 tablet (50 mg total) by mouth daily. 30 tablet 5  . amitriptyline (ELAVIL) 10 MG tablet Take 10 mg by mouth at bedtime.     No current facility-administered medications for this visit.     Review of Systems Review of Systems Constitutional: positive for excessive weight gain Respiratory: negative for cough and wheezing Cardiovascular: negative for chest pain, fatigue and palpitations Gastrointestinal: negative for abdominal pain and change in bowel habits Genitourinary:negative Integument/breast: negative for nipple discharge Musculoskeletal:negative for myalgias Neurological: negative for gait problems and tremors Behavioral/Psych: negative for abusive relationship, depression Endocrine: negative for temperature intolerance      Blood pressure 129/85, pulse 84, weight 273 lb 12.8 oz (124.2 kg).  Physical Exam Physical Exam :  Deferred  50% of 10 min visit spent on counseling and coordination of care.    Data Reviewed Labs  Assessment    1. Class 3 severe obesity due to excess calories without serious comorbidity with body mass index (BMI) of 40.0 to 44.9 in adult [facility]) Rx: Phentermine Rx    Plan    Follow up in 3 months for annual  No orders of the defined types were placed in this encounter.  Meds ordered this encounter  Medications  . DISCONTD: phentermine 37.5 MG capsule    Sig: Take 37.5 mg by mouth every morning.  Marland Kitchen DISCONTD: topiramate (TOPAMAX) 50 MG tablet    Sig: Take 50 mg by mouth daily.  . phentermine 37.5 MG capsule    Sig: Take 1 capsule (37.5 mg total) by mouth every morning.    Dispense:  30 capsule    Refill:  2  . topiramate (TOPAMAX) 50 MG tablet    Sig: Take 1 tablet (50 mg total) by mouth daily.    Dispense:  30 tablet    Refill:  5

## 2017-01-17 ENCOUNTER — Ambulatory Visit: Payer: 59 | Admitting: Obstetrics

## 2017-03-03 ENCOUNTER — Other Ambulatory Visit: Payer: Self-pay | Admitting: Obstetrics

## 2017-03-03 DIAGNOSIS — N760 Acute vaginitis: Principal | ICD-10-CM

## 2017-03-03 DIAGNOSIS — B9689 Other specified bacterial agents as the cause of diseases classified elsewhere: Secondary | ICD-10-CM

## 2017-04-08 DIAGNOSIS — I214 Non-ST elevation (NSTEMI) myocardial infarction: Secondary | ICD-10-CM

## 2017-04-08 HISTORY — DX: Non-ST elevation (NSTEMI) myocardial infarction: I21.4

## 2017-09-02 ENCOUNTER — Ambulatory Visit (INDEPENDENT_AMBULATORY_CARE_PROVIDER_SITE_OTHER): Payer: 59 | Admitting: Obstetrics

## 2017-09-02 ENCOUNTER — Encounter: Payer: Self-pay | Admitting: Obstetrics

## 2017-09-02 VITALS — BP 128/82 | HR 73 | Ht 68.0 in | Wt 268.0 lb

## 2017-09-02 DIAGNOSIS — Z90711 Acquired absence of uterus with remaining cervical stump: Secondary | ICD-10-CM

## 2017-09-02 DIAGNOSIS — Z1239 Encounter for other screening for malignant neoplasm of breast: Secondary | ICD-10-CM

## 2017-09-02 DIAGNOSIS — E66813 Obesity, class 3: Secondary | ICD-10-CM

## 2017-09-02 DIAGNOSIS — Z01419 Encounter for gynecological examination (general) (routine) without abnormal findings: Secondary | ICD-10-CM

## 2017-09-02 DIAGNOSIS — Z113 Encounter for screening for infections with a predominantly sexual mode of transmission: Secondary | ICD-10-CM

## 2017-09-02 DIAGNOSIS — F419 Anxiety disorder, unspecified: Secondary | ICD-10-CM

## 2017-09-02 DIAGNOSIS — N898 Other specified noninflammatory disorders of vagina: Secondary | ICD-10-CM

## 2017-09-02 DIAGNOSIS — Z6841 Body Mass Index (BMI) 40.0 and over, adult: Secondary | ICD-10-CM

## 2017-09-02 MED ORDER — DIAZEPAM 2 MG PO TABS: 2.0000 mg | ORAL_TABLET | Freq: Two times a day (BID) | ORAL | 4 refills | Status: DC | PRN

## 2017-09-02 NOTE — Progress Notes (Signed)
Subjective:        Diana Campos is a 52 y.o. female here for a routine exam.  Current complaints: Occasional lower abdominal pain.    Personal health questionnaire:  Is patient Ashkenazi Jewish, have a family history of breast and/or ovarian cancer: no Is there a family history of uterine cancer diagnosed at age < 15, gastrointestinal cancer, urinary tract cancer, family member who is a Field seismologist syndrome-associated carrier: no Is the patient overweight and hypertensive, family history of diabetes, personal history of gestational diabetes, preeclampsia or PCOS: no Is patient over 63, have PCOS,  family history of premature CHD under age 59, diabetes, smoke, have hypertension or peripheral artery disease:  no At any time, has a partner hit, kicked or otherwise hurt or frightened you?: no Over the past 2 weeks, have you felt down, depressed or hopeless?: no Over the past 2 weeks, have you felt little interest or pleasure in doing things?:no   Gynecologic History No LMP recorded. Patient has had a hysterectomy. Contraception: status post hysterectomy Last Pap: 2015. Results were: normal Last mammogram: 2016. Results were: normal  Obstetric History OB History  Gravida Para Term Preterm AB Living  4 3     1 3   SAB TAB Ectopic Multiple Live Births    1     3    # Outcome Date GA Lbr Len/2nd Weight Sex Delivery Anes PTL Lv  4 Para 12/27/88    M Vag-Spont   LIV  3 Para 09/19/86    M Vag-Spont   LIV  2 TAB 04/08/82          1 Para 02/06/82    F Vag-Spont   LIV    History reviewed. No pertinent past medical history.  Past Surgical History:  Procedure Laterality Date  . ABDOMINAL HYSTERECTOMY    . BACK SURGERY    . TUBAL LIGATION      No current outpatient medications on file. No Known Allergies  Social History   Tobacco Use  . Smoking status: Never Smoker  . Smokeless tobacco: Never Used  Substance Use Topics  . Alcohol use: Yes    Comment: occ    Family History   Problem Relation Age of Onset  . Hypertension Mother   . Cancer Paternal Grandmother       Review of Systems  Constitutional: negative for fatigue and weight loss Respiratory: negative for cough and wheezing Cardiovascular: negative for chest pain, fatigue and palpitations Gastrointestinal: POSITIVE for abdominal pain  Musculoskeletal:negative for myalgias Neurological: negative for gait problems and tremors Behavioral/Psych: negative for abusive relationship, depression Endocrine: negative for temperature intolerance    Genitourinary:negative for abnormal menstrual periods, genital lesions, hot flashes, sexual problems and vaginal discharge Integument/breast: negative for breast lump, breast tenderness, nipple discharge and skin lesion(s)    Objective:       BP 128/82   Pulse 73   Ht 5\' 8"  (1.727 m)   Wt 268 lb (121.6 kg)   BMI 40.75 kg/m  General:   alert  Skin:   no rash or abnormalities  Lungs:   clear to auscultation bilaterally  Heart:   regular rate and rhythm, S1, S2 normal, no murmur, click, rub or gallop  Breasts:   normal without suspicious masses, skin or nipple changes or axillary nodes  Abdomen:  normal findings: no organomegaly, soft, non-tender and no hernia  Pelvis:  External genitalia: normal general appearance Urinary system: urethral meatus normal and bladder without fullness, nontender  Vaginal: normal without tenderness, induration or masses Cervix: normal appearance Adnexa: normal bimanual exam Uterus: anteverted and non-tender, normal size   Lab Review Urine pregnancy test Labs reviewed yes Radiologic studies reviewed yes  50% of 20 min visit spent on counseling and coordination of care.   Assessment:     1. Encounter for gynecological examination  2. S/P abdominal supracervical subtotal hysterectomy  3. Class 3 severe obesity due to excess calories without serious comorbidity with body mass index (BMI) of 40.0 to 44.9 in adult [facility]) -  program of caloric reduction, exercise and behavioral modification recommended  4. Anxiety Rx: - diazepam (VALIUM) 2 MG tablet; Take 1 tablet (2 mg total) by mouth every 12 (twelve) hours as needed for anxiety.  Dispense: 60 tablet; Refill: 4  5. Screening for STD (sexually transmitted disease) Rx: - Hepatitis B surface antigen - HIV antibody - RPR - Hepatitis C antibody  6. Vaginal discharge Rx: - Cervicovaginal ancillary only  7. Screening breast examination Rx: - MM Digital Screening; Future    Plan:    Education reviewed: calcium supplements, depression evaluation, low fat, low cholesterol diet, safe sex/STD prevention, self breast exams and weight bearing exercise. Mammogram ordered. Follow up in: 1 year.   No orders of the defined types were placed in this encounter.  No orders of the defined types were placed in this encounter.   Shelly Bombard MD 09-02-2017

## 2017-09-03 ENCOUNTER — Telehealth: Payer: Self-pay | Admitting: *Deleted

## 2017-09-03 ENCOUNTER — Other Ambulatory Visit: Payer: Self-pay | Admitting: Obstetrics

## 2017-09-03 DIAGNOSIS — N3010 Interstitial cystitis (chronic) without hematuria: Secondary | ICD-10-CM

## 2017-09-03 LAB — CERVICOVAGINAL ANCILLARY ONLY
Bacterial vaginitis: NEGATIVE
Candida vaginitis: NEGATIVE
Trichomonas: NEGATIVE

## 2017-09-03 LAB — HEPATITIS C ANTIBODY: Hep C Virus Ab: <0.1 s/co ratio (ref 0.0–0.9)

## 2017-09-03 LAB — RPR: RPR Ser Ql: NONREACTIVE

## 2017-09-03 LAB — HEPATITIS B SURFACE ANTIGEN: Hepatitis B Surface Ag: NEGATIVE

## 2017-09-03 LAB — HIV ANTIBODY (ROUTINE TESTING W REFLEX): HIV SCREEN 4TH GENERATION: NONREACTIVE

## 2017-09-03 MED ORDER — AMITRIPTYLINE HCL 10 MG PO TABS: 10.0000 mg | ORAL_TABLET | Freq: Every day | ORAL | 4 refills | Status: DC

## 2017-09-03 NOTE — Telephone Encounter (Signed)
Amitriptyline Rx

## 2017-09-03 NOTE — Telephone Encounter (Signed)
Sherissa Pasillas called in stating Dr. Jodi Mourning wanted to know the name of a medication that they discussed so he could prescribe it for her at her last visit the medication is Amitriptyline-HCL and she is also requesting a [place] prescription as well and her preferred pharmacy is [facility] [address]...please advise.Marland KitchenMarland Kitchen

## 2017-09-04 ENCOUNTER — Other Ambulatory Visit: Payer: Self-pay | Admitting: Obstetrics

## 2017-09-04 DIAGNOSIS — B9689 Other specified bacterial agents as the cause of diseases classified elsewhere: Secondary | ICD-10-CM

## 2017-09-04 DIAGNOSIS — N760 Acute vaginitis: Principal | ICD-10-CM

## 2017-09-04 MED ORDER — METRONIDAZOLE 1.3 % VA GEL: 1.0000 | Freq: Every evening | VAGINAL | 4 refills | Status: DC | PRN

## 2017-09-18 ENCOUNTER — Encounter: Payer: Self-pay | Admitting: Obstetrics

## 2017-09-26 ENCOUNTER — Other Ambulatory Visit: Payer: Self-pay

## 2017-09-26 DIAGNOSIS — N390 Urinary tract infection, site not specified: Secondary | ICD-10-CM

## 2017-09-26 MED ORDER — SULFAMETHOXAZOLE-TRIMETHOPRIM 800-160 MG PO TABS: 1.0000 | ORAL_TABLET | Freq: Two times a day (BID) | ORAL | 0 refills | Status: AC

## 2017-09-26 MED ORDER — PHENAZOPYRIDINE HCL 200 MG PO TABS: 200.0000 mg | ORAL_TABLET | Freq: Three times a day (TID) | ORAL | 1 refills | Status: AC | PRN

## 2017-09-26 NOTE — Progress Notes (Signed)
TC from pt w/ CC: urgency and frequency  Rx sent per protocol.  Pt made aware and voiced understanding.   I reviewed the notes and documentation. I will sign for medication ordered however client will need to be seen in the office for a urinalysis for any future signs and symptoms of UTI to assure appropriate treatment is given.  Earlie Server, RN, MSN, NP-BC Nurse Practitioner, [facility] for [employer], [place] [facility] 09/26/2017 1:05 PM

## 2018-01-05 ENCOUNTER — Ambulatory Visit
Admission: RE | Admit: 2018-01-05 | Discharge: 2018-01-05 | Disposition: A | Discharge status: Home / Self Care | Payer: 59 | Source: Ambulatory Visit | Attending: Obstetrics | Admitting: Obstetrics

## 2018-01-05 DIAGNOSIS — Z1239 Encounter for other screening for malignant neoplasm of breast: Secondary | ICD-10-CM

## 2018-02-17 HISTORY — PX: CARDIAC CATHETERIZATION: SHX172

## 2018-02-25 ENCOUNTER — Encounter (HOSPITAL_COMMUNITY): Payer: Self-pay | Admitting: Emergency Medicine

## 2018-02-25 ENCOUNTER — Emergency Department (HOSPITAL_COMMUNITY): Payer: 59

## 2018-02-25 ENCOUNTER — Encounter (HOSPITAL_COMMUNITY)
Admission: EM | Disposition: A | Discharge status: Home / Self Care | Payer: Self-pay | Source: Home / Self Care | Attending: Internal Medicine

## 2018-02-25 ENCOUNTER — Inpatient Hospital Stay (HOSPITAL_COMMUNITY)
Admission: EM | Admit: 2018-02-25 | Discharge: 2018-02-28 | DRG: 280 | Disposition: A | Discharge status: Home / Self Care | Payer: 59 | Attending: Cardiology | Admitting: Cardiology

## 2018-02-25 ENCOUNTER — Inpatient Hospital Stay (HOSPITAL_COMMUNITY): Payer: 59

## 2018-02-25 ENCOUNTER — Other Ambulatory Visit (HOSPITAL_COMMUNITY): Payer: 59

## 2018-02-25 ENCOUNTER — Other Ambulatory Visit: Payer: Self-pay

## 2018-02-25 DIAGNOSIS — I340 Nonrheumatic mitral (valve) insufficiency: Secondary | ICD-10-CM | POA: Diagnosis present

## 2018-02-25 DIAGNOSIS — I2510 Atherosclerotic heart disease of native coronary artery without angina pectoris: Secondary | ICD-10-CM | POA: Diagnosis present

## 2018-02-25 DIAGNOSIS — E669 Obesity, unspecified: Secondary | ICD-10-CM | POA: Diagnosis present

## 2018-02-25 DIAGNOSIS — I2729 Other secondary pulmonary hypertension: Secondary | ICD-10-CM | POA: Diagnosis present

## 2018-02-25 DIAGNOSIS — E785 Hyperlipidemia, unspecified: Secondary | ICD-10-CM | POA: Diagnosis present

## 2018-02-25 DIAGNOSIS — I5181 Takotsubo syndrome: Secondary | ICD-10-CM | POA: Diagnosis present

## 2018-02-25 DIAGNOSIS — Z6841 Body Mass Index (BMI) 40.0 and over, adult: Secondary | ICD-10-CM | POA: Diagnosis not present

## 2018-02-25 DIAGNOSIS — R079 Chest pain, unspecified: Secondary | ICD-10-CM | POA: Diagnosis not present

## 2018-02-25 DIAGNOSIS — G4733 Obstructive sleep apnea (adult) (pediatric): Secondary | ICD-10-CM | POA: Diagnosis present

## 2018-02-25 DIAGNOSIS — Z809 Family history of malignant neoplasm, unspecified: Secondary | ICD-10-CM

## 2018-02-25 DIAGNOSIS — I214 Non-ST elevation (NSTEMI) myocardial infarction: Secondary | ICD-10-CM | POA: Diagnosis present

## 2018-02-25 DIAGNOSIS — Z90710 Acquired absence of both cervix and uterus: Secondary | ICD-10-CM | POA: Diagnosis not present

## 2018-02-25 DIAGNOSIS — F419 Anxiety disorder, unspecified: Secondary | ICD-10-CM | POA: Diagnosis present

## 2018-02-25 DIAGNOSIS — I5043 Acute on chronic combined systolic (congestive) and diastolic (congestive) heart failure: Secondary | ICD-10-CM | POA: Diagnosis present

## 2018-02-25 DIAGNOSIS — Z8249 Family history of ischemic heart disease and other diseases of the circulatory system: Secondary | ICD-10-CM | POA: Diagnosis not present

## 2018-02-25 HISTORY — DX: Non-ST elevation (NSTEMI) myocardial infarction: I21.4

## 2018-02-25 HISTORY — DX: Heart failure, unspecified: I50.9

## 2018-02-25 HISTORY — PX: ULTRASOUND GUIDANCE FOR VASCULAR ACCESS: SHX6516

## 2018-02-25 HISTORY — PX: LEFT HEART CATH AND CORONARY ANGIOGRAPHY: CATH118249

## 2018-02-25 LAB — BASIC METABOLIC PANEL
ANION GAP: 11 (ref 5–15)
BUN: 14 mg/dL (ref 6–20)
CALCIUM: 9.5 mg/dL (ref 8.9–10.3)
CO2: 23 mmol/L (ref 22–32)
CREATININE: 0.98 mg/dL (ref 0.44–1.00)
Chloride: 104 mmol/L (ref 98–111)
GFR calc Af Amer: >60 mL/min (ref >60)
GLUCOSE: 108 mg/dL — AB (ref 70–99)
Potassium: 3.5 mmol/L (ref 3.5–5.1)
Sodium: 138 mmol/L (ref 135–145)

## 2018-02-25 LAB — CBC WITH DIFFERENTIAL/PLATELET
Abs Immature Granulocytes: 0.01 10*3/uL (ref 0.00–0.07)
BASOS PCT: 1 %
Basophils Absolute: 0.1 10*3/uL (ref 0.0–0.1)
Eosinophils Absolute: 0.2 10*3/uL (ref 0.0–0.5)
Eosinophils Relative: 2 %
HCT: 47.3 % — ABNORMAL HIGH (ref 36.0–46.0)
Hemoglobin: 15.4 g/dL — ABNORMAL HIGH (ref 12.0–15.0)
IMMATURE GRANULOCYTES: 0 %
LYMPHS PCT: 50 %
Lymphs Abs: 5.4 10*3/uL — ABNORMAL HIGH (ref 0.7–4.0)
MCH: 29.4 pg (ref 26.0–34.0)
MCHC: 32.6 g/dL (ref 30.0–36.0)
MCV: 90.3 fL (ref 80.0–100.0)
Monocytes Absolute: 0.5 10*3/uL (ref 0.1–1.0)
Monocytes Relative: 5 %
NEUTROS ABS: 4.4 10*3/uL (ref 1.7–7.7)
NEUTROS PCT: 42 %
PLATELETS: 347 10*3/uL (ref 150–400)
RBC: 5.24 MIL/uL — ABNORMAL HIGH (ref 3.87–5.11)
RDW: 13.3 % (ref 11.5–15.5)
WBC: 10.6 10*3/uL — ABNORMAL HIGH (ref 4.0–10.5)
nRBC: 0 % (ref 0.0–0.2)

## 2018-02-25 LAB — MRSA PCR SCREENING: MRSA by PCR: NEGATIVE

## 2018-02-25 LAB — TROPONIN I
TROPONIN I: 1.81 ng/mL — AB (ref <0.03)
Troponin I: 0.95 ng/mL (ref <0.03)
Troponin I: 2.81 ng/mL (ref <0.03)
Troponin I: 2.67 ng/mL (ref <0.03)

## 2018-02-25 LAB — LIPID PANEL
CHOL/HDL RATIO: 5.9 ratio
Cholesterol: 206 mg/dL — ABNORMAL HIGH (ref 0–200)
HDL: 35 mg/dL — ABNORMAL LOW (ref >40)
LDL Cholesterol: 158 mg/dL — ABNORMAL HIGH (ref 0–99)
TRIGLYCERIDES: 66 mg/dL (ref <150)
VLDL: 13 mg/dL (ref 0–40)

## 2018-02-25 LAB — I-STAT TROPONIN, ED: Troponin i, poc: 0.88 ng/mL (ref 0.00–0.08)

## 2018-02-25 SURGERY — LEFT HEART CATH AND CORONARY ANGIOGRAPHY
Anesthesia: LOCAL

## 2018-02-25 MED ORDER — MIDAZOLAM HCL 2 MG/2ML IJ SOLN
INTRAMUSCULAR | Status: DC | PRN
  Administered 2018-02-25: 1 mg via INTRAVENOUS

## 2018-02-25 MED ORDER — HEPARIN (PORCINE) 25000 UT/250ML-% IV SOLN
1100.0000 [IU]/h | INTRAVENOUS | Status: DC
  Administered 2018-02-25: 1100 [IU]/h via INTRAVENOUS
  Filled 2018-02-25: qty 250

## 2018-02-25 MED ORDER — IBUPROFEN 800 MG PO TABS: 800.0000 mg | ORAL_TABLET | Freq: Once | ORAL | Status: DC

## 2018-02-25 MED ORDER — SODIUM CHLORIDE 0.9 % WEIGHT BASED INFUSION
3.0000 mL/kg/h | INTRAVENOUS | Status: DC
  Administered 2018-02-25: 3 mL/kg/h via INTRAVENOUS

## 2018-02-25 MED ORDER — HEPARIN (PORCINE) 25000 UT/250ML-% IV SOLN
1550.0000 [IU]/h | INTRAVENOUS | Status: DC
  Administered 2018-02-25: 1100 [IU]/h via INTRAVENOUS
  Administered 2018-02-26: 1700 [IU]/h via INTRAVENOUS
  Administered 2018-02-27: 1550 [IU]/h via INTRAVENOUS
  Filled 2018-02-25 (×3): qty 250

## 2018-02-25 MED ORDER — SODIUM CHLORIDE 0.9% FLUSH
3.0000 mL | Freq: Two times a day (BID) | INTRAVENOUS | Status: DC
  Administered 2018-02-25 – 2018-02-27 (×3): 3 mL via INTRAVENOUS

## 2018-02-25 MED ORDER — ATORVASTATIN CALCIUM 80 MG PO TABS
80.0000 mg | ORAL_TABLET | Freq: Every day | ORAL | Status: DC
  Administered 2018-02-25 – 2018-02-26 (×2): 80 mg via ORAL
  Filled 2018-02-25 (×2): qty 1

## 2018-02-25 MED ORDER — ASPIRIN 81 MG PO CHEW
324.0000 mg | CHEWABLE_TABLET | Freq: Once | ORAL | Status: AC
  Administered 2018-02-25: 324 mg via ORAL
  Filled 2018-02-25: qty 4

## 2018-02-25 MED ORDER — LIDOCAINE HCL (PF) 1 % IJ SOLN
INTRAMUSCULAR | Status: DC | PRN
  Administered 2018-02-25: 2 mL

## 2018-02-25 MED ORDER — HEPARIN SODIUM (PORCINE) 1000 UNIT/ML IJ SOLN
INTRAMUSCULAR | Status: DC | PRN
  Administered 2018-02-25: 6000 [IU] via INTRAVENOUS

## 2018-02-25 MED ORDER — NITROGLYCERIN 0.4 MG SL SUBL
0.4000 mg | SUBLINGUAL_TABLET | SUBLINGUAL | Status: DC | PRN
  Filled 2018-02-25: qty 1

## 2018-02-25 MED ORDER — METOPROLOL TARTRATE 12.5 MG HALF TABLET
12.5000 mg | ORAL_TABLET | Freq: Two times a day (BID) | ORAL | Status: DC
  Administered 2018-02-25 – 2018-02-28 (×7): 12.5 mg via ORAL
  Filled 2018-02-25 (×7): qty 1

## 2018-02-25 MED ORDER — SODIUM CHLORIDE 0.9% FLUSH: 3.0000 mL | Freq: Two times a day (BID) | INTRAVENOUS | Status: DC

## 2018-02-25 MED ORDER — HEPARIN (PORCINE) IN NACL 1000-0.9 UT/500ML-% IV SOLN
INTRAVENOUS | Status: AC
  Filled 2018-02-25: qty 1000

## 2018-02-25 MED ORDER — NITROGLYCERIN 0.4 MG SL SUBL
0.4000 mg | SUBLINGUAL_TABLET | SUBLINGUAL | Status: AC | PRN
  Administered 2018-02-25 (×3): 0.4 mg via SUBLINGUAL
  Filled 2018-02-25: qty 1

## 2018-02-25 MED ORDER — SODIUM CHLORIDE 0.9 % IV SOLN
250.0000 mL | INTRAVENOUS | Status: DC | PRN
  Administered 2018-02-26: 250 mL via INTRAVENOUS

## 2018-02-25 MED ORDER — ASPIRIN 81 MG PO CHEW
81.0000 mg | CHEWABLE_TABLET | Freq: Every day | ORAL | Status: DC
  Administered 2018-02-26 – 2018-02-28 (×3): 81 mg via ORAL
  Filled 2018-02-25 (×3): qty 1

## 2018-02-25 MED ORDER — LIDOCAINE HCL (PF) 1 % IJ SOLN
INTRAMUSCULAR | Status: AC
  Filled 2018-02-25: qty 30

## 2018-02-25 MED ORDER — MORPHINE SULFATE (PF) 10 MG/ML IV SOLN
INTRAVENOUS | Status: DC | PRN
  Administered 2018-02-25: 2 mg via INTRAVENOUS

## 2018-02-25 MED ORDER — FENTANYL CITRATE (PF) 100 MCG/2ML IJ SOLN
INTRAMUSCULAR | Status: AC
  Filled 2018-02-25: qty 2

## 2018-02-25 MED ORDER — HEPARIN (PORCINE) IN NACL 1000-0.9 UT/500ML-% IV SOLN
INTRAVENOUS | Status: DC | PRN
  Administered 2018-02-25: 500 mL

## 2018-02-25 MED ORDER — NITROGLYCERIN IN D5W 200-5 MCG/ML-% IV SOLN
0.0000 ug/min | INTRAVENOUS | Status: DC
  Administered 2018-02-25: 10 ug/min via INTRAVENOUS
  Administered 2018-02-26: 46.667 ug/min via INTRAVENOUS
  Filled 2018-02-25 (×2): qty 250

## 2018-02-25 MED ORDER — SODIUM CHLORIDE 0.9 % WEIGHT BASED INFUSION: 1.0000 mL/kg/h | INTRAVENOUS | Status: DC

## 2018-02-25 MED ORDER — ONDANSETRON HCL 4 MG/2ML IJ SOLN: 4.0000 mg | Freq: Four times a day (QID) | INTRAMUSCULAR | Status: DC | PRN

## 2018-02-25 MED ORDER — MIDAZOLAM HCL 2 MG/2ML IJ SOLN
INTRAMUSCULAR | Status: AC
  Filled 2018-02-25: qty 2

## 2018-02-25 MED ORDER — ONDANSETRON HCL 4 MG/2ML IJ SOLN
4.0000 mg | Freq: Once | INTRAMUSCULAR | Status: AC
  Administered 2018-02-25: 4 mg via INTRAVENOUS
  Filled 2018-02-25: qty 2

## 2018-02-25 MED ORDER — VERAPAMIL HCL 2.5 MG/ML IV SOLN
INTRAVENOUS | Status: DC | PRN
  Administered 2018-02-25: 10 mL via INTRA_ARTERIAL

## 2018-02-25 MED ORDER — IOHEXOL 350 MG/ML SOLN
INTRAVENOUS | Status: DC | PRN
  Administered 2018-02-25: 90 mL via INTRA_ARTERIAL

## 2018-02-25 MED ORDER — MORPHINE SULFATE (PF) 2 MG/ML IV SOLN
2.0000 mg | INTRAVENOUS | Status: DC | PRN
  Administered 2018-02-25 – 2018-02-27 (×8): 2 mg via INTRAVENOUS
  Filled 2018-02-25 (×8): qty 1

## 2018-02-25 MED ORDER — VERAPAMIL HCL 2.5 MG/ML IV SOLN
INTRAVENOUS | Status: AC
  Filled 2018-02-25: qty 2

## 2018-02-25 MED ORDER — SODIUM CHLORIDE 0.9% FLUSH: 3.0000 mL | INTRAVENOUS | Status: DC | PRN

## 2018-02-25 MED ORDER — ASPIRIN EC 81 MG PO TBEC: 81.0000 mg | DELAYED_RELEASE_TABLET | Freq: Every day | ORAL | Status: DC

## 2018-02-25 MED ORDER — ACETAMINOPHEN 500 MG PO TABS
1000.0000 mg | ORAL_TABLET | Freq: Once | ORAL | Status: AC
  Administered 2018-02-25: 1000 mg via ORAL
  Filled 2018-02-25: qty 2

## 2018-02-25 MED ORDER — ACETAMINOPHEN 325 MG PO TABS
650.0000 mg | ORAL_TABLET | ORAL | Status: DC | PRN
  Administered 2018-02-25: 650 mg via ORAL
  Filled 2018-02-25: qty 2

## 2018-02-25 MED ORDER — MORPHINE SULFATE (PF) 10 MG/ML IV SOLN
INTRAVENOUS | Status: AC
  Filled 2018-02-25: qty 1

## 2018-02-25 MED ORDER — HEPARIN BOLUS VIA INFUSION
4000.0000 [IU] | Freq: Once | INTRAVENOUS | Status: AC
  Administered 2018-02-25: 4000 [IU] via INTRAVENOUS
  Filled 2018-02-25: qty 4000

## 2018-02-25 MED ORDER — ACETAMINOPHEN 325 MG PO TABS
650.0000 mg | ORAL_TABLET | ORAL | Status: DC | PRN
  Administered 2018-02-25 – 2018-02-27 (×3): 650 mg via ORAL
  Filled 2018-02-25 (×3): qty 2

## 2018-02-25 MED ORDER — HEPARIN (PORCINE) IN NACL 1000-0.9 UT/500ML-% IV SOLN
INTRAVENOUS | Status: AC
  Filled 2018-02-25: qty 500

## 2018-02-25 MED ORDER — FENTANYL CITRATE (PF) 100 MCG/2ML IJ SOLN
INTRAMUSCULAR | Status: DC | PRN
  Administered 2018-02-25: 25 ug via INTRAVENOUS

## 2018-02-25 MED ORDER — ASPIRIN 81 MG PO CHEW
81.0000 mg | CHEWABLE_TABLET | ORAL | Status: AC
  Administered 2018-02-25: 81 mg via ORAL
  Filled 2018-02-25: qty 1

## 2018-02-25 MED ORDER — SODIUM CHLORIDE 0.9 % IV SOLN: 250.0000 mL | INTRAVENOUS | Status: DC | PRN

## 2018-02-25 SURGICAL SUPPLY — 10 items
CATH 5FR JL3.5 JR4 ANG PIG MP (CATHETERS) ×3 IMPLANT
DEVICE RAD COMP TR BAND LRG (VASCULAR PRODUCTS) ×3 IMPLANT
GLIDESHEATH SLEND A-KIT 6F 22G (SHEATH) ×3 IMPLANT
GUIDEWIRE INQWIRE 1.5J.035X260 (WIRE) ×2 IMPLANT
INQWIRE 1.5J .035X260CM (WIRE) ×3
KIT HEART LEFT (KITS) ×3 IMPLANT
PACK CARDIAC CATHETERIZATION (CUSTOM PROCEDURE TRAY) ×3 IMPLANT
SHEATH PROBE COVER 6X72 (BAG) ×3 IMPLANT
TRANSDUCER W/STOPCOCK (MISCELLANEOUS) ×3 IMPLANT
TUBING CIL FLEX 10 FLL-RA (TUBING) ×3 IMPLANT

## 2018-02-25 NOTE — ED Notes (Signed)
Pt stated she still has some chest discomfort. Pt refused 3rd nitro. Pt stated her chest pain feels a lot better but there is still slight discomfort

## 2018-02-25 NOTE — CV Procedure (Signed)
   Heart cath performed from right antecubital and right radial approach using ultrasound guidance for arterial access.  Severe pulmonary hypertension was documented with systolics greater than 628 mmHg and mean PA pressure of 77 mmHg.  Pulmonary capillary wedge pressure mean 40 mmHg.  Selective engagement of the left coronary was not possible due to innominate/ascending aortic angulation.  Nonselective left coronary images suggested widely patent left main, LAD, and circumflex.  Right coronary artery was selectively engaged and was completely normal.  Known severe mitral regurgitation due to flail leaflet documented earlier today by transesophageal echocardiography.  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  Apparent widely patent coronary arteries although selective engagement of the left coronary was not possible.  Recommend ADMISSION to the hospital for optimization of heart failure therapy with: IV nitroglycerin, IV diuresis, addition of ARB, continue beta-blocker therapy, advanced heart failure team involvement, and cardiac surgery involvement by Dr. Darylene Price for ultimate valve repair once compensated.

## 2018-02-25 NOTE — Progress Notes (Signed)
ANTICOAGULATION CONSULT NOTE  Pharmacy Consult for Heparin  Indication: chest pain/ACS  No Known Allergies  Patient Measurements: Height: 5\' 8"  (172.7 cm) Weight: 280 lb 3.3 oz (127.1 kg) IBW/kg (Calculated) : 63.9  Vital Signs: Temp: 97.9 F (36.6 C) (11/20 0518) Temp Source: Oral (11/20 0518) BP: 128/87 (11/20 1033) Pulse Rate: 0 (11/20 1033)  Labs: Recent Labs    02/25/18 0214 02/25/18 0532  HGB 15.4*  --   HCT 47.3*  --   PLT 347  --   CREATININE 0.98  --   TROPONINI 0.95* 2.81*    Estimated Creatinine Clearance: 96.7 mL/min (by C-G formula based on SCr of 0.98 mg/dL).   Assessment: 52 y/o F admitted with chest pain, started on IV heparin for NSTEMI.  Now s/p cath which revealed combined systolic/diastolic heart failure, and dyskinetic LV apex.  Pharmacy asked to resume IV heparin to reduce the risk of LV clot formation.  Resume 8 hrs after sheath out, removed at   Previously on  Heparin 1100 units/hr prior to cath, was stopped before level could be drawn.  Goal of Therapy:  Heparin level 0.3-0.7 units/ml Monitor platelets by anticoagulation protocol: Yes   Plan:  Resume heparin at 1100 units/hr at 1815 pm. Check heparin level 6 hrs after gtt resumes. Daily heparin level and CBC. F/u plans for long term anticoagulation?  Marguerite Olea, [facility] Clinical Pharmacist Phone [phone]  02/25/2018 11:44 AM

## 2018-02-25 NOTE — ED Provider Notes (Signed)
Patient presented to the ER with chest pain.  Patient reports onset of pain earlier tonight that radiated to neck and arm.  She reports no previous cardiac history, although reports that she has been feeling well for the last month, has felt "something is wrong".  Face to face Exam: HEENT - PERRLA Lungs - CTAB Heart - RRR, no M/R/G Abd - S/NT/ND Neuro - alert, oriented x3  Plan: EKG does not show obvious ischemia or infarct, however, troponin is elevated.  Initiate aspirin, heparin, nitroglycerin, admit cardiology.   Orpah Greek, MD 02/25/18 [phone]

## 2018-02-25 NOTE — Progress Notes (Signed)
TR Band removed , 2x2 gauze and tegaderm applied. No bleeding noted.

## 2018-02-25 NOTE — CV Procedure (Addendum)
   Left heart cath with coronary angiography performed via right radial approach using ultrasound guidance.  Normal coronary arteries.  Anteroapical akinesis/dyskinesis with basal hypokinesis.  EF 30 to 35%.  LVEDP 25-30.  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  IV nitroglycerin is serving a useful Rolnel to keep EDP down until other therapies can be started.  No immediate postprocedure complications.

## 2018-02-25 NOTE — Progress Notes (Signed)
   Patient presented with chest pain overnight. Found to have an NSTEMI. Trops up to 2.81 this morning. She notes recurrent chest pain this morning and right arm pain/tingling. Repeat EKG without dynamic changes. Discussed need for cardiac catheterization to evaluate coronary anatomy. The patient understands that risks included but are not limited to stroke (1 in 1000), death (1 in 6), kidney failure [usually temporary] (1 in 500), bleeding (1 in 200), allergic reaction [possibly serious] (1 in 200).  The patient understands and agrees to proceed.   Patient is planned for LHC this morning to further evaluate coronary anatomy. Orders placed. Keep NPO. Will start nitro gtt given ongoing chest pain.   Dr. Harrington Challenger to see.   Abigail Butts, PA-C 02/25/18

## 2018-02-25 NOTE — Progress Notes (Addendum)
Progress Note  Patient Name: MCKALA PANTALEON Date of Encounter: 02/25/2018  Primary Cardiologist: New  Subjective   Breatihng is OK   CP 4/10      Inpatient Medications    Scheduled Meds: . aspirin  81 mg Oral Pre-Cath  . [START ON 02/26/2018] aspirin EC  81 mg Oral Daily  . atorvastatin  80 mg Oral q1800  . metoprolol tartrate  12.5 mg Oral BID  . sodium chloride flush  3 mL Intravenous Q12H   Continuous Infusions: . sodium chloride    . sodium chloride 3 mL/kg/hr (02/25/18 0810)   Followed by  . sodium chloride Stopped (02/25/18 0810)  . heparin 1,100 Units/hr (02/25/18 0500)  . nitroGLYCERIN 10 mcg/min (02/25/18 0801)   PRN Meds: sodium chloride, acetaminophen, ondansetron (ZOFRAN) IV, sodium chloride flush   Vital Signs    Vitals:   02/25/18 0330 02/25/18 0430 02/25/18 0445 02/25/18 0518  BP: (!) 129/96 138/86 117/83 117/77  Pulse: 89 85 83 82  Resp: 16 17 15 18   Temp:    97.9 F (36.6 C)  TempSrc:    Oral  SpO2: 96% 98% 97% 97%  Weight:    127.1 kg  Height:    5\' 8"  (1.727 m)    Intake/Output Summary (Last 24 hours) at 02/25/2018 0909 Last data filed at 02/25/2018 0500 Gross per 24 hour  Intake 54.75 ml  Output -  Net 54.75 ml   Filed Weights   02/25/18 0156 02/25/18 0518  Weight: 121 kg 127.1 kg    Telemetry    SR   - Personally Reviewed  ECG    SR   T wave inversion inferior and laterally   At 1:50 AM T wave inversion not present and there was subtle elevation in AVL  Physical Exam   GEN:  Morbidly obese 52 yo complaing on mild CP   4/10     Neck: No JVD Cardiac: RRR, no murmurs, rubs, or gallops.  Respiratory: Clear to auscultation bilaterally. GI: Soft, nontender, non-distended  MS: No edema; No deformity. Neuro:  Nonfocal  Psych: Normal affect   Labs    Chemistry Recent Labs  Lab 02/25/18 0214  NA 138  K 3.5  CL 104  CO2 23  GLUCOSE 108*  BUN 14  CREATININE 0.98  CALCIUM 9.5  GFRNONAA >60  GFRAA >60  ANIONGAP 11       Hematology Recent Labs  Lab 02/25/18 0214  WBC 10.6*  RBC 5.24*  HGB 15.4*  HCT 47.3*  MCV 90.3  MCH 29.4  MCHC 32.6  RDW 13.3  PLT 347    Cardiac Enzymes Recent Labs  Lab 02/25/18 0214 02/25/18 0532  TROPONINI 0.95* 2.81*    Recent Labs  Lab 02/25/18 0219  TROPIPOC 0.88*     BNPNo results for input(s): BNP, PROBNP in the last 168 hours.   DDimer No results for input(s): DDIMER in the last 168 hours.   Radiology    Dg Chest 2 View  Result Date: 02/25/2018 CLINICAL DATA:  Headache beginning during intercourse tonight. Now central chest pain and pain radiating to the throat and shoulder. EXAM: CHEST - 2 VIEW COMPARISON:  None. FINDINGS: Thoracic scoliosis convex towards the right. Normal heart size and pulmonary vascularity. No focal airspace disease or consolidation in the lungs. No blunting of costophrenic angles. No pneumothorax. Mediastinal contours appear intact. IMPRESSION: No active cardiopulmonary disease. Electronically Signed   By: Lucienne Capers M.D.   On: 02/25/2018 02:35  Cardiac Studies     Patient Profile     53 y.o. female hx of anxiety and morbid obesity presents with CP    Assessment & Plan    1.  Cardiac  Pt has continued CP   4/10 at present   Troponin continues to rise  Reviewed with pt   Recomm L heart catheterization to define anatomy and possibly intervent  Risks and benefits explained   Pt understands and agrees to proceed.  2.   HL   LIpids this am LDL 158  HDL 35   Lipitor 80 just started     For questions or updates, please contact [place] Please consult [email or web address] for contact info under        Signed, Dorris Carnes, MD  02/25/2018, 9:09 AM

## 2018-02-25 NOTE — Progress Notes (Signed)
Right wrist post cath site  Slightly swollen, but soft, warm to touch, Radial pulse +2. Capillary refill  +,  Elevated withy pillow. Cath lab staff came to assess, claimed to monitor. PA made aware. Continue to monitor.

## 2018-02-25 NOTE — ED Notes (Signed)
Pt. To XRAY via stretcher. 

## 2018-02-25 NOTE — Interval H&P Note (Signed)
Cath Lab Visit (complete for each Cath Lab visit)  Clinical Evaluation Leading to the Procedure:   ACS: Yes.    Non-ACS:    Anginal Classification: CCS III  Anti-ischemic medical therapy: Minimal Therapy (1 class of medications)  Non-Invasive Test Results: No non-invasive testing performed  Prior CABG: No previous CABG      History and Physical Interval Note:  02/25/2018 9:47 AM  Diana Campos  has presented today for surgery, with the diagnosis of ns  The various methods of treatment have been discussed with the patient and family. After consideration of risks, benefits and other options for treatment, the patient has consented to  Procedure(s): LEFT HEART CATH AND CORONARY ANGIOGRAPHY (N/A) Ultrasound Guidance For Vascular Access as a surgical intervention .  The patient's history has been reviewed, patient examined, no change in status, stable for surgery.  I have reviewed the patient's chart and labs.  Questions were answered to the patient's satisfaction.     Belva Crome III

## 2018-02-25 NOTE — H&P (View-Only) (Signed)
Progress Note  Patient Name: Diana Campos Date of Encounter: 02/25/2018  Primary Cardiologist: New  Subjective   Breatihng is OK   CP 4/10      Inpatient Medications    Scheduled Meds: . aspirin  81 mg Oral Pre-Cath  . [START ON 02/26/2018] aspirin EC  81 mg Oral Daily  . atorvastatin  80 mg Oral q1800  . metoprolol tartrate  12.5 mg Oral BID  . sodium chloride flush  3 mL Intravenous Q12H   Continuous Infusions: . sodium chloride    . sodium chloride 3 mL/kg/hr (02/25/18 0810)   Followed by  . sodium chloride Stopped (02/25/18 0810)  . heparin 1,100 Units/hr (02/25/18 0500)  . nitroGLYCERIN 10 mcg/min (02/25/18 0801)   PRN Meds: sodium chloride, acetaminophen, ondansetron (ZOFRAN) IV, sodium chloride flush   Vital Signs    Vitals:   02/25/18 0330 02/25/18 0430 02/25/18 0445 02/25/18 0518  BP: (!) 129/96 138/86 117/83 117/77  Pulse: 89 85 83 82  Resp: 16 17 15 18   Temp:    97.9 F (36.6 C)  TempSrc:    Oral  SpO2: 96% 98% 97% 97%  Weight:    127.1 kg  Height:    5\' 8"  (1.727 m)    Intake/Output Summary (Last 24 hours) at 02/25/2018 0909 Last data filed at 02/25/2018 0500 Gross per 24 hour  Intake 54.75 ml  Output -  Net 54.75 ml   Filed Weights   02/25/18 0156 02/25/18 0518  Weight: 121 kg 127.1 kg    Telemetry    SR   - Personally Reviewed  ECG    SR   T wave inversion inferior and laterally   At 1:50 AM T wave inversion not present and there was subtle elevation in AVL  Physical Exam   GEN:  Morbidly obese 52 yo complaing on mild CP   4/10     Neck: No JVD Cardiac: RRR, no murmurs, rubs, or gallops.  Respiratory: Clear to auscultation bilaterally. GI: Soft, nontender, non-distended  MS: No edema; No deformity. Neuro:  Nonfocal  Psych: Normal affect   Labs    Chemistry Recent Labs  Lab 02/25/18 0214  NA 138  K 3.5  CL 104  CO2 23  GLUCOSE 108*  BUN 14  CREATININE 0.98  CALCIUM 9.5  GFRNONAA >60  GFRAA >60  ANIONGAP 11       Hematology Recent Labs  Lab 02/25/18 0214  WBC 10.6*  RBC 5.24*  HGB 15.4*  HCT 47.3*  MCV 90.3  MCH 29.4  MCHC 32.6  RDW 13.3  PLT 347    Cardiac Enzymes Recent Labs  Lab 02/25/18 0214 02/25/18 0532  TROPONINI 0.95* 2.81*    Recent Labs  Lab 02/25/18 0219  TROPIPOC 0.88*     BNPNo results for input(s): BNP, PROBNP in the last 168 hours.   DDimer No results for input(s): DDIMER in the last 168 hours.   Radiology    Dg Chest 2 View  Result Date: 02/25/2018 CLINICAL DATA:  Headache beginning during intercourse tonight. Now central chest pain and pain radiating to the throat and shoulder. EXAM: CHEST - 2 VIEW COMPARISON:  None. FINDINGS: Thoracic scoliosis convex towards the right. Normal heart size and pulmonary vascularity. No focal airspace disease or consolidation in the lungs. No blunting of costophrenic angles. No pneumothorax. Mediastinal contours appear intact. IMPRESSION: No active cardiopulmonary disease. Electronically Signed   By: Lucienne Capers M.D.   On: 02/25/2018 02:35  Cardiac Studies     Patient Profile     52 y.o. female hx of anxiety and morbid obesity presents with CP    Assessment & Plan    1.  Cardiac  Pt has continued CP   4/10 at present   Troponin continues to rise  Reviewed with pt   Recomm L heart catheterization to define anatomy and possibly intervent  Risks and benefits explained   Pt understands and agrees to proceed.  2.   HL   LIpids this am LDL 158  HDL 35   Lipitor 80 just started     For questions or updates, please contact [place] Please consult [email or web address] for contact info under        Signed, Dorris Carnes, MD  02/25/2018, 9:09 AM

## 2018-02-25 NOTE — H&P (Signed)
Cardiology Admission History and Physical:   Patient ID: Diana Campos; MRN: [id number]; DOB: October 13, 1965   Admission date: 02/25/2018  Primary Care Provider:  Patient, No Pcp Per Primary Cardiologist:  No primary care provider on file   Chief Complaint:    Chest pain  History of Present Illness:   Diana Campos is a 52 y.o. female with a history of anxiety and morbid obesity who presents with complaints of chest pain.  Patient reports having central chest pressure after going on a date tonight and attempting to be intimate.  She initially had a headache and later developed chest pain.  The pain radiated to her right shoulder and arm as well as to her throat and jaw.  She states that the pain lasted for approximately an hour and was not relieved till she presented to the emergency department and was given nitroglycerin.  She did not have any associated diaphoresis nausea or vomiting.  She also denies any palpitations, presyncope or syncope.  She had a similar episode but of a milder in intensity approximately 1 year ago.  She has not had any recurrences since.  She works 2 jobs.  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  She is able to lift heavy objects without any issues.  She denies any orthopnea, paroxysmal nocturnal dyspnea or lower extremity edema.  She also denies any fevers or chills.  In the hospital her ECG only showed sinus rhythm with poor R wave progression.  Her troponin was 0.95.  She was given 324 mg of aspirin, IV heparin and nitroglycerin.     PAST MEDICAL HISTORY: Anxiety Morbid obesity  Past Surgical History:  Procedure Laterality Date  . ABDOMINAL HYSTERECTOMY    . BACK SURGERY    . TUBAL LIGATION       Medications Prior to Admission: Prior to Admission medications   Medication Sig Start Date End Date Taking? Authorizing Provider  amitriptyline (ELAVIL) 10 MG tablet Take 1 tablet (10 mg total) by mouth at  bedtime. Patient not taking: Reported on 02/25/2018 09/03/17   Shelly Bombard, MD  diazepam (VALIUM) 2 MG tablet Take 1 tablet (2 mg total) by mouth every 12 (twelve) hours as needed for anxiety. Patient not taking: Reported on 02/25/2018 09/02/17   Shelly Bombard, MD  metroNIDAZOLE (NUVESSA) 1.3 % GEL Place 1 Applicatorful vaginally at bedtime as needed. Patient not taking: Reported on 02/25/2018 09/04/17   Shelly Bombard, MD     Allergies:   No Known Allergies  Social History:   Social History   Socioeconomic History  . Marital status: Divorced    Spouse name: Not on file  . Number of children: Not on file  . Years of education: Not on file  . Highest education level: Not on file  Occupational History  . Not on file  Social Needs  . Financial resource strain: Not on file  . Food insecurity:    Worry: Not on file    Inability: Not on file  . Transportation needs:    Medical: Not on file    Non-medical: Not on file  Tobacco Use  . Smoking status: Never Smoker  . Smokeless tobacco: Never Used  Substance and Sexual Activity  . Alcohol use: Yes    Comment: occ  . Drug use: No  . Sexual activity: Yes    Partners: Male  Lifestyle  . Physical  activity:    Days per week: Not on file    Minutes per session: Not on file  . Stress: Not on file  Relationships  . Social connections:    Talks on phone: Not on file    Gets together: Not on file    Attends religious service: Not on file    Active member of club or organization: Not on file    Attends meetings of clubs or organizations: Not on file    Relationship status: Not on file  . Intimate partner violence:    Fear of current or ex partner: Not on file    Emotionally abused: Not on file    Physically abused: Not on file    Forced sexual activity: Not on file  Other Topics Concern  . Not on file  Social History Narrative  . Not on file     Family History:  The patient's family history includes Cancer in her  paternal grandmother; Hypertension in her mother.     Review of Systems: [y] = yes, [ ]  = no   . General: Weight gain [ ] ; Weight loss [ ] ; Anorexia [ ] ; Fatigue [ ] ; Fever [ ] ; Chills [ ] ; Weakness [ ]   . Cardiac: Chest pain/pressure Blue.Reese ]; Resting SOB [ ] ; Exertional SOB [ ] ; Orthopnea [ ] ; Pedal Edema [ ] ; Palpitations [ ] ; Syncope [ ] ; Presyncope [ ] ; Paroxysmal nocturnal dyspnea[ ]   . Pulmonary: Cough [ ] ; Wheezing[ ] ; Hemoptysis[ ] ; Sputum [ ] ; Snoring [ ]   . GI: Vomiting[ ] ; Dysphagia[ ] ; Melena[ ] ; Hematochezia [ ] ; Heartburn[ ] ; Abdominal pain [ ] ; Constipation [ ] ; Diarrhea [ ] ; BRBPR [ ]   . GU: Hematuria[ ] ; Dysuria [ ] ; Nocturia[ ]   . Vascular: Pain in legs with walking [ ] ; Pain in feet with lying flat [ ] ; Non-healing sores [ ] ; Stroke [ ] ; TIA [ ] ; Slurred speech [ ] ;  . Neuro: Headaches[y ]; Vertigo[ ] ; Seizures[ ] ; Paresthesias[ ] ;Blurred vision [ ] ; Diplopia [ ] ; Vision changes [ ]   . Ortho/Skin: Arthritis [ ] ; Joint pain [ ] ; Muscle pain [ ] ; Joint swelling [ ] ; Back Pain [ ] ; Rash [ ]   . Psych: Depression[ ] ; Anxiety[ ]   . Heme: Bleeding problems [ ] ; Clotting disorders [ ] ; Anemia [ ]   . Endocrine: Diabetes [ ] ; Thyroid dysfunction[ ]      Physical Exam/Data:   Vitals:   02/25/18 0152 02/25/18 0156 02/25/18 0245  BP: (!) 134/99  (!) 144/95  Pulse: 86  89  Resp: 20  (!) 21  Temp: 97.8 F (36.6 C)    TempSrc: Oral    SpO2: 100%  97%  Weight:  121 kg   Height:  5\' 8"  (1.727 m)    No intake or output data in the 24 hours ending 02/25/18 0344 Filed Weights   02/25/18 0156  Weight: 121 kg   Body mass index is 40.56 kg/m.  General:  Well nourished, well developed, in no acute distress HEENT: normal Lymph: no adenopathy Neck: no JVD Endocrine:  No thryomegaly Vascular: No carotid bruits; FA pulses 2+ bilaterally without bruits  Cardiac:  normal S1, S2; RRR; no murmur  Lungs:  clear to auscultation bilaterally, no wheezing, rhonchi or rales  Abd: soft,  nontender, no hepatomegaly  Ext: no edema Musculoskeletal:  No deformities, BUE and BLE strength normal and equal Skin: warm and dry  Neuro:  CNs 2-12 intact, no focal abnormalities noted Psych:  Normal  affect    EKG:  The ECG that was done today (02/25/2018) was personally reviewed and demonstrates sinus rhythm, rate 91 bpm, and poor R wave progression.    Laboratory Data:  Chemistry Recent Labs  Lab 02/25/18 0214  NA 138  K 3.5  CL 104  CO2 23  GLUCOSE 108*  BUN 14  CREATININE 0.98  CALCIUM 9.5  GFRNONAA >60  GFRAA >60  ANIONGAP 11    No results for input(s): PROT, ALBUMIN, AST, ALT, ALKPHOS, BILITOT in the last 168 hours. Hematology Recent Labs  Lab 02/25/18 0214  WBC 10.6*  RBC 5.24*  HGB 15.4*  HCT 47.3*  MCV 90.3  MCH 29.4  MCHC 32.6  RDW 13.3  PLT 347   Cardiac Enzymes Recent Labs  Lab 02/25/18 0214  TROPONINI 0.95*    Recent Labs  Lab 02/25/18 0219  TROPIPOC 0.88*    BNPNo results for input(s): BNP, PROBNP in the last 168 hours.  DDimer No results for input(s): DDIMER in the last 168 hours.  Radiology/Studies:  Dg Chest 2 View  Result Date: 02/25/2018 CLINICAL DATA:  Headache beginning during intercourse tonight. Now central chest pain and pain radiating to the throat and shoulder. EXAM: CHEST - 2 VIEW COMPARISON:  None. FINDINGS: Thoracic scoliosis convex towards the right. Normal heart size and pulmonary vascularity. No focal airspace disease or consolidation in the lungs. No blunting of costophrenic angles. No pneumothorax. Mediastinal contours appear intact. IMPRESSION: No active cardiopulmonary disease. Electronically Signed   By: Lucienne Capers M.D.   On: 02/25/2018 02:35    Assessment and Plan:   1. Non-ST elevation myocardial infarction  -Continue to cycle cardiac enzymes and obtain serial ECGs -Aspirin 81 mg daily -Unfractionated heparin IV -High dose statins such as atorvastatin 80 mg nightly, check a lipid panel -Start  metoprolol at 12.5 mg twice daily -Obtain a transthoracic echocardiogram to evaluate the LV systolic and diastolic function -Keep the patient n.p.o. for a possible cardiac catheterization to delineate the coronary anatomy   Severity of Illness: The appropriate patient status for this patient is INPATIENT. Inpatient status is judged to be reasonable and necessary in order to provide the required intensity of service to ensure the patient's safety. The patient's presenting symptoms, physical exam findings, and initial radiographic and laboratory data in the context of their chronic comorbidities is felt to place them at high risk for further clinical deterioration. Furthermore, it is not anticipated that the patient will be medically stable for discharge from the hospital within 2 midnights of admission. The following factors support the patient status of inpatient.   " The patient's presenting symptoms include chest pain. " The physical exam findings include normal cardiac auscultation " The initial radiographic and laboratory data are worrisome because of elevated troponin. " The chronic co-morbidities include obesity.   * I certify that at the point of admission it is my clinical judgment that the patient will require inpatient hospital care spanning beyond 2 midnights from the point of admission due to high intensity of service, high risk for further deterioration and high frequency of surveillance required.*    For questions or updates, please contact [place] Please consult [email or web address] for contact info under Cardiology/STEMI.    Signed, Meade Maw, MD  02/25/2018 3:44 AM

## 2018-02-25 NOTE — Progress Notes (Addendum)
Pt complaining of 5/10 right arm pain/ numbness which began right before pt was brought up from ED. MD paged. MD advised to give pt tylenol and NTG sublingual. Will continue to monitor.

## 2018-02-25 NOTE — ED Triage Notes (Signed)
Pt states during intercourse tonight she started to have a headache. Her headache has became a more dull less painful headache but is now experiencing central chest pain with pain radiating to her throat, back and right shoulder.

## 2018-02-25 NOTE — Progress Notes (Signed)
ANTICOAGULATION CONSULT NOTE - Initial Consult  Pharmacy Consult for Heparin  Indication: chest pain/ACS  No Known Allergies  Patient Measurements: Height: 5\' 8"  (172.7 cm) Weight: 266 lb 12.1 oz (121 kg) IBW/kg (Calculated) : 63.9  Vital Signs: Temp: 97.8 F (36.6 C) (11/20 0152) Temp Source: Oral (11/20 0152) BP: 144/95 (11/20 0245) Pulse Rate: 89 (11/20 0245)  Labs: Recent Labs    02/25/18 0214  HGB 15.4*  HCT 47.3*  PLT 347    CrCl cannot be calculated (Patient's most recent lab result is older than the maximum 21 days allowed.).   Assessment: 52 y/o F with radiating CP, starting heparin, PTA meds reviewed, CBC good  Goal of Therapy:  Heparin level 0.3-0.7 units/ml Monitor platelets by anticoagulation protocol: Yes   Plan:  Heparin 4000 units BOLUS Start heparin drip at 1100 units/hr 1200 HL Daily CBC/HL Monitor for bleeding   Narda Bonds 02/25/2018,3:17 AM

## 2018-02-25 NOTE — Progress Notes (Signed)
Back from the cath lab awake and alert. TR Band to right wrist  Intact , elevated with pillow. Pulse ox to right thumb. Instructed to avoid moving right arm.

## 2018-02-25 NOTE — ED Provider Notes (Signed)
[facility] EMERGENCY DEPARTMENT Provider Note   CSN: [id number] Arrival date & time: 02/25/18  0135     History   Chief Complaint Chief Complaint  Patient presents with  . Chest Pain  . Headache    HPI GLYN ZENDEJAS is a 52 y.o. female.  The history is provided by the patient and medical records.     52 y.o. F with no significant PMH presenting to the ED for chest pain and headache. Patient states she went on a date tonight and it started getting "intimate" with heavy touching in the car.  States afterwards she got a "splitting headache" that felt like her head was going to explode.  She went the house to lay down and it started to ease up.  States she was on the phone talking to her friend when she started developing some central chest pressure.  States it feels like a "heaviness".  States it started to ease off but then started returning again when she got up and was trying to walk around.  She is never experienced this before so decided to come and get evaluated.  States headache at this time is much improved.  She denies any dizziness, numbness, or weakness.  Patient has no real medical problems.  She has no prior cardiac history.  She is not a smoker.  No known family cardiac history.  She did not take any medications prior to arrival.  Patient reports she has episode like this about a month ago, saw her PCP and had EKG done that was normal but did not have any further work-up after that.  History reviewed. No pertinent past medical history.  Patient Active Problem List   Diagnosis Date Noted  . Anxiety 12/16/2012  . BV (bacterial vaginosis) 12/16/2012  . Obesity 12/16/2012    Past Surgical History:  Procedure Laterality Date  . ABDOMINAL HYSTERECTOMY    . BACK SURGERY    . TUBAL LIGATION       OB History    Gravida  4   Para  3   Term      Preterm      AB  1   Living  3     SAB      TAB  1   Ectopic      Multiple      Live  Births  3            Home Medications    Prior to Admission medications   Medication Sig Start Date End Date Taking? Authorizing Provider  amitriptyline (ELAVIL) 10 MG tablet Take 1 tablet (10 mg total) by mouth at bedtime. Patient not taking: Reported on 02/25/2018 09/03/17   Shelly Bombard, MD  diazepam (VALIUM) 2 MG tablet Take 1 tablet (2 mg total) by mouth every 12 (twelve) hours as needed for anxiety. Patient not taking: Reported on 02/25/2018 09/02/17   Shelly Bombard, MD  metroNIDAZOLE (NUVESSA) 1.3 % GEL Place 1 Applicatorful vaginally at bedtime as needed. Patient not taking: Reported on 02/25/2018 09/04/17   Shelly Bombard, MD    Family History Family History  Problem Relation Age of Onset  . Hypertension Mother   . Cancer Paternal Grandmother     Social History Social History   Tobacco Use  . Smoking status: Never Smoker  . Smokeless tobacco: Never Used  Substance Use Topics  . Alcohol use: Yes    Comment: occ  . Drug use: No  Allergies   Patient has no known allergies.   Review of Systems Review of Systems  Cardiovascular: Positive for chest pain.  Neurological: Positive for headaches.  All other systems reviewed and are negative.    Physical Exam Updated Vital Signs BP (!) 134/99 (BP Location: Right Arm)   Pulse 86   Temp 97.8 F (36.6 C) (Oral)   Resp 20   Ht 5\' 8"  (1.727 m)   Wt 121 kg   SpO2 100%   BMI 40.56 kg/m   Physical Exam  Constitutional: She is oriented to person, place, and time. She appears well-developed and well-nourished. No distress.  HENT:  Head: Normocephalic and atraumatic.  Right Ear: External ear normal.  Left Ear: External ear normal.  Mouth/Throat: Oropharynx is clear and moist.  Eyes: Pupils are equal, round, and reactive to light. Conjunctivae and EOM are normal.  Neck: Normal range of motion and full passive range of motion without pain. Neck supple. No neck rigidity.  No rigidity, no  meningismus  Cardiovascular: Normal rate, regular rhythm and normal heart sounds.  No murmur heard. Pulmonary/Chest: Effort normal and breath sounds normal. No respiratory distress. She has no wheezes. She has no rhonchi.  Abdominal: Soft. Bowel sounds are normal. There is no tenderness. There is no guarding.  Musculoskeletal: Normal range of motion. She exhibits no edema.  Neurological: She is alert and oriented to person, place, and time. She has normal strength. She displays no tremor. No cranial nerve deficit or sensory deficit. She displays no seizure activity.  AAOx3, answering questions and following commands appropriately; equal strength UE and LE bilaterally; CN grossly intact; moves all extremities appropriately without ataxia; no focal neuro deficits or facial asymmetry appreciated  Skin: Skin is warm and dry. No rash noted. She is not diaphoretic.  Psychiatric: She has a normal mood and affect. Her behavior is normal. Thought content normal.  Nursing note and vitals reviewed.    ED Treatments / Results  Labs (all labs ordered are listed, but only abnormal results are displayed) Labs Reviewed  CBC WITH DIFFERENTIAL/PLATELET - Abnormal; Notable for the following components:      Result Value   WBC 10.6 (*)    RBC 5.24 (*)    Hemoglobin 15.4 (*)    HCT 47.3 (*)    Lymphs Abs 5.4 (*)    All other components within normal limits  BASIC METABOLIC PANEL - Abnormal; Notable for the following components:   Glucose, Bld 108 (*)    All other components within normal limits  TROPONIN I - Abnormal; Notable for the following components:   Troponin I 0.95 (*)    All other components within normal limits  I-STAT TROPONIN, ED - Abnormal; Notable for the following components:   Troponin i, poc 0.88 (*)    All other components within normal limits  HEPARIN LEVEL (UNFRACTIONATED)    EKG EKG Interpretation  Date/Time:  Wednesday February 25 2018 01:51:15 EST Ventricular Rate:  91 PR  Interval:    QRS Duration: 94 QT Interval:  361 QTC Calculation: 445 R Axis:   1 Text Interpretation:  Sinus rhythm Low voltage, precordial leads No previous tracing Confirmed by Orpah Greek [phone]) on 02/25/2018 2:41:42 AM   Radiology Dg Chest 2 View  Result Date: 02/25/2018 CLINICAL DATA:  Headache beginning during intercourse tonight. Now central chest pain and pain radiating to the throat and shoulder. EXAM: CHEST - 2 VIEW COMPARISON:  None. FINDINGS: Thoracic scoliosis convex towards the right. Normal  heart size and pulmonary vascularity. No focal airspace disease or consolidation in the lungs. No blunting of costophrenic angles. No pneumothorax. Mediastinal contours appear intact. IMPRESSION: No active cardiopulmonary disease. Electronically Signed   By: Lucienne Capers M.D.   On: 02/25/2018 02:35    Procedures Procedures (including critical care time)  CRITICAL CARE Performed by: Larene Pickett   Total critical care time: 40 minutes  Critical care time was exclusive of separately billable procedures and treating other patients.  Critical care was necessary to treat or prevent imminent or life-threatening deterioration.  Critical care was time spent personally by me on the following activities: development of treatment plan with patient and/or surrogate as well as nursing, discussions with consultants, evaluation of patient's response to treatment, examination of patient, obtaining history from patient or surrogate, ordering and performing treatments and interventions, ordering and review of laboratory studies, ordering and review of radiographic studies, pulse oximetry and re-evaluation of patient's condition.   Medications Ordered in ED Medications  nitroGLYCERIN (NITROSTAT) SL tablet 0.4 mg (0.4 mg Sublingual Given 02/25/18 0303)  heparin ADULT infusion 100 units/mL (25000 units/254mL sodium chloride 0.45%) (1,100 Units/hr Intravenous New Bag/Given 02/25/18  0336)  aspirin chewable tablet 324 mg (324 mg Oral Given 02/25/18 0253)  ondansetron (ZOFRAN) injection 4 mg (4 mg Intravenous Given 02/25/18 0306)  heparin bolus via infusion 4,000 Units (4,000 Units Intravenous Bolus from Bag 02/25/18 0336)  acetaminophen (TYLENOL) tablet 1,000 mg (1,000 mg Oral Given 02/25/18 0333)     Initial Impression / Assessment and Plan / ED Course  I have reviewed the triage vital signs and the nursing notes.  Pertinent labs & imaging results that were available during my care of the patient were reviewed by me and considered in my medical decision making (see chart for details).  52 y.o. F here with headache and chest pain after being intimate in the car this evening.  She reports headache is better but still have some central chest pressure.  No known cardiac history or significant risk factors.  EKG NSR, no acute ischemic findings.  Labs with trop of 0.88, otherwise reassuring.  CXR clear.  Patient given ASA, NTG, and started on heparin.  Will admit to cardiology service for NSTEMI.  Final Clinical Impressions(s) / ED Diagnoses   Final diagnoses:  NSTEMI (non-ST elevated myocardial infarction) [facility])    ED Discharge Orders    None       Larene Pickett, PA-C 02/25/18 0358    Orpah Greek, MD 02/25/18 [phone]

## 2018-02-25 NOTE — ED Notes (Signed)
Dr. Betsey Holiday notified of the elevated-stat trop of 0.88

## 2018-02-26 ENCOUNTER — Inpatient Hospital Stay (HOSPITAL_COMMUNITY): Payer: 59

## 2018-02-26 ENCOUNTER — Encounter (HOSPITAL_COMMUNITY): Payer: Self-pay | Admitting: General Practice

## 2018-02-26 DIAGNOSIS — R079 Chest pain, unspecified: Secondary | ICD-10-CM

## 2018-02-26 DIAGNOSIS — I214 Non-ST elevation (NSTEMI) myocardial infarction: Secondary | ICD-10-CM

## 2018-02-26 HISTORY — DX: Non-ST elevation (NSTEMI) myocardial infarction: I21.4

## 2018-02-26 LAB — BASIC METABOLIC PANEL
Anion gap: 6 (ref 5–15)
BUN: 10 mg/dL (ref 6–20)
CALCIUM: 8.4 mg/dL — AB (ref 8.9–10.3)
CHLORIDE: 104 mmol/L (ref 98–111)
CO2: 25 mmol/L (ref 22–32)
CREATININE: 0.93 mg/dL (ref 0.44–1.00)
GFR calc Af Amer: >60 mL/min (ref >60)
GFR calc non Af Amer: >60 mL/min (ref >60)
GLUCOSE: 103 mg/dL — AB (ref 70–99)
Potassium: 3.6 mmol/L (ref 3.5–5.1)
Sodium: 135 mmol/L (ref 135–145)

## 2018-02-26 LAB — CBC
HEMATOCRIT: 40.6 % (ref 36.0–46.0)
Hemoglobin: 13 g/dL (ref 12.0–15.0)
MCH: 29.2 pg (ref 26.0–34.0)
MCHC: 32 g/dL (ref 30.0–36.0)
MCV: 91.2 fL (ref 80.0–100.0)
PLATELETS: 277 10*3/uL (ref 150–400)
RBC: 4.45 MIL/uL (ref 3.87–5.11)
RDW: 13.5 % (ref 11.5–15.5)
WBC: 11.9 10*3/uL — ABNORMAL HIGH (ref 4.0–10.5)
nRBC: 0 % (ref 0.0–0.2)

## 2018-02-26 LAB — ECHOCARDIOGRAM COMPLETE
HEIGHTINCHES: 68 in
Weight: 4483.27 oz

## 2018-02-26 LAB — HEPARIN LEVEL (UNFRACTIONATED)
Heparin Unfractionated: >2.2 IU/mL — ABNORMAL HIGH (ref 0.30–0.70)
Heparin Unfractionated: <0.1 IU/mL — ABNORMAL LOW (ref 0.30–0.70)
Heparin Unfractionated: 0.12 IU/mL — ABNORMAL LOW (ref 0.30–0.70)
Heparin Unfractionated: 0.48 IU/mL (ref 0.30–0.70)

## 2018-02-26 NOTE — Progress Notes (Addendum)
ANTICOAGULATION CONSULT NOTE   Pharmacy Consult for Heparin  Indication: chest pain/ACS  No Known Allergies  Patient Measurements: Height: 5\' 8"  (172.7 cm) Weight: 280 lb 3.3 oz (127.1 kg) IBW/kg (Calculated) : 63.9  Vital Signs: Temp: 98.6 F (37 C) (11/21 1515) Temp Source: Oral (11/21 1515) BP: 98/53 (11/21 1515) Pulse Rate: 87 (11/21 1515)  Labs: Recent Labs    02/25/18 0214 02/25/18 0532 02/25/18 1219 02/25/18 1611  02/26/18 0217 02/26/18 1108 02/26/18 1840  HGB 15.4*  --   --   --   --  13.0  --   --   HCT 47.3*  --   --   --   --  40.6  --   --   PLT 347  --   --   --   --  277  --   --   HEPARINUNFRC  --   --   --   --    < > <0.10* 0.12* 0.48  CREATININE 0.98  --   --   --   --  0.93  --   --   TROPONINI 0.95* 2.81* 2.67* 1.81*  --   --   --   --    < > = values in this interval not displayed.    Estimated Creatinine Clearance: 101.9 mL/min (by C-G formula based on SCr of 0.93 mg/dL).   Assessment: 52 y/o F with CP on heparin post cath at risk for LV thrombus formation -heparin level  goal on 1700 units/hr   Goal of Therapy:  Heparin level 0.3-0.7 units/ml Monitor platelets by anticoagulation protocol: Yes   Plan:  -No heparin changes needed Daily heparin level and CBC  Hildred Laser, PharmD Clinical Pharmacist **Pharmacist phone directory can now be found on [email or web address] ([email or web address]).  Listed under [place].

## 2018-02-26 NOTE — Plan of Care (Signed)

## 2018-02-26 NOTE — Progress Notes (Signed)
Progress Note  Patient Name: Diana Campos Date of Encounter: 02/26/2018  Primary Cardiologist: New  Subjective   Patient still with some intermitt R arm pain   Has some intermittent back pain   No CP   Breahting is OK    Inpatient Medications    Scheduled Meds: . aspirin  81 mg Oral Daily  . atorvastatin  80 mg Oral q1800  . metoprolol tartrate  12.5 mg Oral BID  . sodium chloride flush  3 mL Intravenous Q12H   Continuous Infusions: . sodium chloride    . heparin 1,400 Units/hr (02/26/18 0306)  . nitroGLYCERIN 46.667 mcg/min (02/26/18 0212)   PRN Meds: sodium chloride, acetaminophen, morphine injection, ondansetron (ZOFRAN) IV, sodium chloride flush   Vital Signs    Vitals:   02/25/18 2300 02/26/18 0309 02/26/18 0635 02/26/18 0743  BP: (!) 101/50  106/63 (!) 96/56  Pulse: 79 83  97  Resp: 18   19  Temp: 98.5 F (36.9 C) 98.3 F (36.8 C)  99 F (37.2 C)  TempSrc: Oral   Oral  SpO2: 97%   98%  Weight:      Height:        Intake/Output Summary (Last 24 hours) at 02/26/2018 0823 Last data filed at 02/26/2018 0700 Gross per 24 hour  Intake 332 ml  Output 0 ml  Net 332 ml   Filed Weights   02/25/18 0156 02/25/18 0518  Weight: 121 kg 127.1 kg    Telemetry    SR / ST   T wave inversion     - Personally Reviewed  ECG      Physical Exam   GEN:  Morbidly obese 52 yo complaing on mild CP   4/10     Neck: JVP is normal   Cardiac: RRR, no murmurs, rubs, or gallops.  Respiratory: Clear to auscultation bilaterally. GI: Soft, nontender, non-distended  MS: No edema; No deformity. Neuro:  Nonfocal  Psych: Normal affect   Labs    Chemistry Recent Labs  Lab 02/25/18 0214 02/26/18 0217  NA 138 135  K 3.5 3.6  CL 104 104  CO2 23 25  GLUCOSE 108* 103*  BUN 14 10  CREATININE 0.98 0.93  CALCIUM 9.5 8.4*  GFRNONAA >60 >60  GFRAA >60 >60  ANIONGAP 11 6     Hematology Recent Labs  Lab 02/25/18 0214 02/26/18 0217  WBC 10.6* 11.9*  RBC 5.24*  4.45  HGB 15.4* 13.0  HCT 47.3* 40.6  MCV 90.3 91.2  MCH 29.4 29.2  MCHC 32.6 32.0  RDW 13.3 13.5  PLT 347 277    Cardiac Enzymes Recent Labs  Lab 02/25/18 0214 02/25/18 0532 02/25/18 1219 02/25/18 1611  TROPONINI 0.95* 2.81* 2.67* 1.81*    Recent Labs  Lab 02/25/18 0219  TROPIPOC 0.88*     BNPNo results for input(s): BNP, PROBNP in the last 168 hours.   DDimer No results for input(s): DDIMER in the last 168 hours.   Radiology    Dg Chest 2 View  Result Date: 02/25/2018 CLINICAL DATA:  Headache beginning during intercourse tonight. Now central chest pain and pain radiating to the throat and shoulder. EXAM: CHEST - 2 VIEW COMPARISON:  None. FINDINGS: Thoracic scoliosis convex towards the right. Normal heart size and pulmonary vascularity. No focal airspace disease or consolidation in the lungs. No blunting of costophrenic angles. No pneumothorax. Mediastinal contours appear intact. IMPRESSION: No active cardiopulmonary disease. Electronically Signed   By: Oren Beckmann.D.  On: 02/25/2018 02:35    Cardiac Studies    Right dominant coronary anatomy  Normal left main  Widely patent LAD that wraps around the left ventricular apex without significant obstruction.  Distal LAD caliber less than anticipated although no areas of angiographic obstruction, possibly due to dyskinesis of the anterior wall.  Cannot totally exclude the possibility of SCAD.  Normal circumflex  Normal ramus intermedius  Anteroapical akinesis/dyskinesis compatible with Takotsubo/stress cardiomyopathy  Acute systolic heart failure with EF 35% and LVEDP 25 mmHg.  RECOMMENDATIONS:   Guideline directed therapy for systolic heart failure -ACE/ARB and beta-blocker therapy as tolerated by blood pressure.  IV heparin or subcu Lovenox given dyskinetic LV apex.  Hopefully LV recovery will begin soon and risk of thrombus formation will significantly abate.  Aspirin 81 mg/day.  Agree with  therapy of severely elevated statins.  Consider evaluation for sleep apnea.  Having ongoing chest discomfort that is assumed related to stress cardiomyopathy syndrome.  52 y.o. female hx of anxiety and morbid obesity presents with CP for progression to hemodynamic compromise.    Opiate therapy for control of pain.  Recommend Aspirin 81mg  daily for moderate CAD.  Patient Profile     52 y.o. female hx of anxiety and morbid obesity presents with CP    Assessment & Plan    1.  Cardiac  Cath as noted above   Consistent with Takotsubo's CM   (cannot completely exclude SCAD)    LVEDP 25     Plan for echo today   Will follow BP   Try to give 1 dose of lasix   Continue heparin for now   Wean NTG to off B Blocker and possible ARB as BP tolerates     2.   HL   LIpids this am LDL 158  HDL 35   Lipitor 80 just started   3  Morbid obesity   Discussed walking and diet once recovers.     For questions or updates, please contact [place] Please consult [email or web address] for contact info under        Signed, Dorris Carnes, MD  02/26/2018, 8:23 AM

## 2018-02-26 NOTE — Progress Notes (Signed)
Pt c/o back pain starting in mid back and moving to the chest area,also states  Has chest pressure , v/s done bp 122/76 hr 82. Medicated with morphine , care continues.

## 2018-02-26 NOTE — Progress Notes (Signed)
  Echocardiogram 2D Echocardiogram has been performed.  Diana Campos 02/26/2018, 10:21 AM

## 2018-02-26 NOTE — Progress Notes (Signed)
ANTICOAGULATION CONSULT NOTE   Pharmacy Consult for Heparin  Indication: chest pain/ACS  No Known Allergies  Patient Measurements: Height: 5\' 8"  (172.7 cm) Weight: 280 lb 3.3 oz (127.1 kg) IBW/kg (Calculated) : 63.9  Vital Signs: Temp: 98.5 F (36.9 C) (11/20 2300) Temp Source: Oral (11/20 2300) BP: 101/50 (11/20 2300) Pulse Rate: 79 (11/20 2300)  Labs: Recent Labs    02/25/18 0214 02/25/18 0532 02/25/18 1219 02/25/18 1611 02/26/18 0028 02/26/18 0217  HGB 15.4*  --   --   --   --  13.0  HCT 47.3*  --   --   --   --  40.6  PLT 347  --   --   --   --  277  HEPARINUNFRC  --   --   --   --  >2.20* <0.10*  CREATININE 0.98  --   --   --   --  0.93  TROPONINI 0.95* 2.81* 2.67* 1.81*  --   --     Estimated Creatinine Clearance: 101.9 mL/min (by C-G formula based on SCr of 0.93 mg/dL).   Assessment: 52 y/o F with radiating CP, starting heparin, PTA meds reviewed, CBC good  11/21 AM update: heparin level undetectable this AM after re-start s/p cath, no issues per RN, previous level of >2.2 was drawn next to heparin infusion   Goal of Therapy:  Heparin level 0.3-0.7 units/ml Monitor platelets by anticoagulation protocol: Yes   Plan:  Increase heparin to 1400 units/hr Re-check heparin level in 6-8 hours   Narda Bonds 02/26/2018,2:57 AM

## 2018-02-26 NOTE — Progress Notes (Signed)
ANTICOAGULATION CONSULT NOTE   Pharmacy Consult for Heparin  Indication: chest pain/ACS  No Known Allergies  Patient Measurements: Height: 5\' 8"  (172.7 cm) Weight: 280 lb 3.3 oz (127.1 kg) IBW/kg (Calculated) : 63.9  Vital Signs: Temp: 99 F (37.2 C) (11/21 0743) Temp Source: Oral (11/21 0743) BP: 93/57 (11/21 1019) Pulse Rate: 88 (11/21 1019)  Labs: Recent Labs    02/25/18 0214 02/25/18 0532 02/25/18 1219 02/25/18 1611 02/26/18 0028 02/26/18 0217 02/26/18 1108  HGB 15.4*  --   --   --   --  13.0  --   HCT 47.3*  --   --   --   --  40.6  --   PLT 347  --   --   --   --  277  --   HEPARINUNFRC  --   --   --   --  >2.20* <0.10* 0.12*  CREATININE 0.98  --   --   --   --  0.93  --   TROPONINI 0.95* 2.81* 2.67* 1.81*  --   --   --     Estimated Creatinine Clearance: 101.9 mL/min (by C-G formula based on SCr of 0.93 mg/dL).   Assessment: 52 y/o F with radiating CP, starting heparin, PTA meds reviewed, CBC good  11/21 1108 AM update: heparin level subtherapeutic at 0.12    Goal of Therapy:  Heparin level 0.3-0.7 units/ml Monitor platelets by anticoagulation protocol: Yes   Plan:  Increase heparin to 1700 units/hr Re-check heparin level in 6-8 hours   Alanda Slim, PharmD, [facility] Clinical Pharmacist Please see AMION for all Pharmacists' Contact Phone Numbers 02/26/2018, 11:56 AM

## 2018-02-27 ENCOUNTER — Telehealth: Payer: Self-pay | Admitting: Internal Medicine

## 2018-02-27 LAB — CBC
HCT: 36.7 % (ref 36.0–46.0)
Hemoglobin: 12 g/dL (ref 12.0–15.0)
MCH: 29.6 pg (ref 26.0–34.0)
MCHC: 32.7 g/dL (ref 30.0–36.0)
MCV: 90.4 fL (ref 80.0–100.0)
NRBC: 0 % (ref 0.0–0.2)
PLATELETS: 247 10*3/uL (ref 150–400)
RBC: 4.06 MIL/uL (ref 3.87–5.11)
RDW: 13.4 % (ref 11.5–15.5)
WBC: 10.3 10*3/uL (ref 4.0–10.5)

## 2018-02-27 LAB — HEPARIN LEVEL (UNFRACTIONATED): HEPARIN UNFRACTIONATED: 0.74 [IU]/mL — AB (ref 0.30–0.70)

## 2018-02-27 MED ORDER — AMLODIPINE BESYLATE 2.5 MG PO TABS
2.5000 mg | ORAL_TABLET | Freq: Every day | ORAL | Status: DC
  Administered 2018-02-27 – 2018-02-28 (×2): 2.5 mg via ORAL
  Filled 2018-02-27 (×2): qty 1

## 2018-02-27 MED ORDER — ATORVASTATIN CALCIUM 10 MG PO TABS
40.0000 mg | ORAL_TABLET | Freq: Every day | ORAL | Status: DC
  Administered 2018-02-27: 40 mg via ORAL
  Filled 2018-02-27: qty 4

## 2018-02-27 NOTE — Progress Notes (Signed)
Progress Note  Patient Name: Diana Campos Date of Encounter: 02/27/2018  Primary Cardiologist: New  Subjective   Minimal chest discomfort after breath   Breathing is OK  Inpatient Medications    Scheduled Meds: . aspirin  81 mg Oral Daily  . atorvastatin  80 mg Oral q1800  . metoprolol tartrate  12.5 mg Oral BID  . sodium chloride flush  3 mL Intravenous Q12H   Continuous Infusions: . sodium chloride 250 mL (02/26/18 1858)  . heparin 1,550 Units/hr (02/27/18 0630)   PRN Meds: sodium chloride, acetaminophen, morphine injection, ondansetron (ZOFRAN) IV, sodium chloride flush   Vital Signs    Vitals:   02/26/18 2029 02/26/18 2358 02/27/18 0315 02/27/18 0737  BP: 127/79 (!) 109/46 120/74 113/66  Pulse: 90   77  Resp: (!) 25 18 15 20   Temp: 98.4 F (36.9 C) 98.6 F (37 C) 98.8 F (37.1 C) 98.4 F (36.9 C)  TempSrc: Oral Oral Oral Oral  SpO2: 99%     Weight:      Height:        Intake/Output Summary (Last 24 hours) at 02/27/2018 0823 Last data filed at 02/27/2018 0737 Gross per 24 hour  Intake 1087.09 ml  Output 0 ml  Net 1087.09 ml   Filed Weights   02/25/18 0156 02/25/18 0518  Weight: 121 kg 127.1 kg    Telemetry       SR- Personally Reviewed  ECG      Physical Exam   GEN:  Morbidly obese 52 yo  In NAD  Neck: JVP is not elevated   Cardiac: RRR, no murmurs, rubs, or gallops.  Respiratory: Clear to auscultation bilaterally. GI: Soft, nontender, non-distended  MS: No edema; No deformity. Neuro:  Nonfocal  Psych: Normal affect   Labs    Chemistry Recent Labs  Lab 02/25/18 0214 02/26/18 0217  NA 138 135  K 3.5 3.6  CL 104 104  CO2 23 25  GLUCOSE 108* 103*  BUN 14 10  CREATININE 0.98 0.93  CALCIUM 9.5 8.4*  GFRNONAA >60 >60  GFRAA >60 >60  ANIONGAP 11 6     Hematology Recent Labs  Lab 02/25/18 0214 02/26/18 0217 02/27/18 0312  WBC 10.6* 11.9* 10.3  RBC 5.24* 4.45 4.06  HGB 15.4* 13.0 12.0  HCT 47.3* 40.6 36.7  MCV 90.3  91.2 90.4  MCH 29.4 29.2 29.6  MCHC 32.6 32.0 32.7  RDW 13.3 13.5 13.4  PLT 347 277 247    Cardiac Enzymes Recent Labs  Lab 02/25/18 0214 02/25/18 0532 02/25/18 1219 02/25/18 1611  TROPONINI 0.95* 2.81* 2.67* 1.81*    Recent Labs  Lab 02/25/18 0219  TROPIPOC 0.88*     BNPNo results for input(s): BNP, PROBNP in the last 168 hours.   DDimer No results for input(s): DDIMER in the last 168 hours.   Radiology    No results found.  Cardiac Studies    Right dominant coronary anatomy  Normal left main  Widely patent LAD that wraps around the left ventricular apex without significant obstruction.  Distal LAD caliber less than anticipated although no areas of angiographic obstruction, possibly due to dyskinesis of the anterior wall.  Cannot totally exclude the possibility of SCAD.  Normal circumflex  Normal ramus intermedius  Anteroapical akinesis/dyskinesis compatible with Takotsubo/stress cardiomyopathy  Acute systolic heart failure with EF 35% and LVEDP 25 mmHg.  RECOMMENDATIONS:   Guideline directed therapy for systolic heart failure -ACE/ARB and beta-blocker therapy as tolerated by blood  pressure.  IV heparin or subcu Lovenox given dyskinetic LV apex.  Hopefully LV recovery will begin soon and risk of thrombus formation will significantly abate.  Aspirin 81 mg/day.  Agree with therapy of severely elevated statins.  Consider evaluation for sleep apnea.  Having ongoing chest discomfort that is assumed related to stress cardiomyopathy syndrome.  She is at increased risk for progression to hemodynamic compromise.    Opiate therapy for control of pain.  Recommend Aspirin 81mg  daily for moderate CAD.  Patient Profile     52 y.o. female hx of anxiety and morbid obesity presents with CP  Assessment & Plan    1.  Cardiac  Pt presents with CP after phone sex  Cath as noted above   Consistent with Takotsubo's CM  LVEDP 25     On exam today she is  comfortable   Volume status appears ok   She did not get lasix yesterday   Echo yesterday:  LVEF 55% with apical hypokinesis   LV function is improved from cath I have reviewed echo and cath images with pt  WOuld continue b blocker   WOuld add low dose amlodipine 2.5 mg for spasm   I would do this before ACEI/ARB   Follow BP  2.   HL    LDL 158  HDL 35   On lipitor   I switched to 40 mg     3  Morbid obesity   Discussed walking and diet once recovers.     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

## 2018-02-27 NOTE — Progress Notes (Signed)
Pt c/o chest pressure 4/10 medication given. Pt refused [place] 4l.  Will continue to monitor Saunders Revel T

## 2018-02-27 NOTE — Progress Notes (Signed)
ANTICOAGULATION CONSULT NOTE   Pharmacy Consult for Heparin  Indication: chest pain/ACS  No Known Allergies  Patient Measurements: Height: 5\' 8"  (172.7 cm) Weight: 280 lb 3.3 oz (127.1 kg) IBW/kg (Calculated) : 63.9  Vital Signs: Temp: 98.8 F (37.1 C) (11/22 0315) Temp Source: Oral (11/22 0315) BP: 120/74 (11/22 0315) Pulse Rate: 90 (11/21 2029)  Labs: Recent Labs    02/25/18 0214 02/25/18 0532 02/25/18 1219 02/25/18 1611  02/26/18 0217 02/26/18 1108 02/26/18 1840 02/27/18 0312  HGB 15.4*  --   --   --   --  13.0  --   --  12.0  HCT 47.3*  --   --   --   --  40.6  --   --  36.7  PLT 347  --   --   --   --  277  --   --  247  HEPARINUNFRC  --   --   --   --    < > <0.10* 0.12* 0.48 0.74*  CREATININE 0.98  --   --   --   --  0.93  --   --   --   TROPONINI 0.95* 2.81* 2.67* 1.81*  --   --   --   --   --    < > = values in this interval not displayed.    Estimated Creatinine Clearance: 101.9 mL/min (by C-G formula based on SCr of 0.93 mg/dL).   Assessment: 52 y/o F with radiating CP, starting heparin, PTA meds reviewed, CBC good  11/22 AM update: heparin level just above goal this AM, no issues per RN.    Goal of Therapy:  Heparin level 0.3-0.7 units/ml Monitor platelets by anticoagulation protocol: Yes   Plan:  Dec heparin to 1550 units/hr Re-check heparin level in 6-8 hours   Narda Bonds 02/27/2018,4:18 AM

## 2018-02-27 NOTE — Telephone Encounter (Signed)
**Note De-identified Diana Campos Obfuscation** The pt has not been discharged from the hospital yet. Will call Monday. 

## 2018-02-27 NOTE — Plan of Care (Signed)

## 2018-02-27 NOTE — Progress Notes (Signed)
CARDIAC REHAB PHASE I    PRE:  Rate/Rhythm: 52 SR  BP:  Sitting: 101/65      SaO2: 98 RA  MODE:  Ambulation: 250 ft 80 peak HR  POST:  Rate/Rhythm: 69 SR  BP:  Sitting: 107/62    SaO2: 98 RA   Pt ambulated 253ft in hallway standby assist with slow gait. Pt took several short standing rest breaks, c/o SOB. Pt guided through purse lipped breathing. Encourage pt to ambulate as able today, but listen to her body. Pt eager to shower, will complete ed at another time.  [id number] Diana Falco, RN BSN 02/27/2018 11:43 AM

## 2018-02-27 NOTE — Progress Notes (Addendum)
Pt c/o little back pain and chest tightness.  Walked in hall.  Placed on 4lnc. VS stable.  Rechecked pt and no back pain.  Pt stated" been passing gas and feels better"  Educated pt.  Will continue to monitor. Saunders Revel T

## 2018-02-27 NOTE — Telephone Encounter (Signed)
New message:       TOC appt with Phylliss Bob on 03/26/18 @ 9:00 per Daleen Snook

## 2018-02-28 DIAGNOSIS — E785 Hyperlipidemia, unspecified: Secondary | ICD-10-CM

## 2018-02-28 DIAGNOSIS — I5181 Takotsubo syndrome: Secondary | ICD-10-CM

## 2018-02-28 LAB — CBC
HEMATOCRIT: 39.5 % (ref 36.0–46.0)
HEMOGLOBIN: 12.6 g/dL (ref 12.0–15.0)
MCH: 29 pg (ref 26.0–34.0)
MCHC: 31.9 g/dL (ref 30.0–36.0)
MCV: 91 fL (ref 80.0–100.0)
Platelets: 283 10*3/uL (ref 150–400)
RBC: 4.34 MIL/uL (ref 3.87–5.11)
RDW: 13.4 % (ref 11.5–15.5)
WBC: 9 10*3/uL (ref 4.0–10.5)
nRBC: 0 % (ref 0.0–0.2)

## 2018-02-28 MED ORDER — NITROGLYCERIN 0.4 MG SL SUBL: 0.4000 mg | SUBLINGUAL_TABLET | SUBLINGUAL | 12 refills | Status: DC | PRN

## 2018-02-28 MED ORDER — ATORVASTATIN CALCIUM 40 MG PO TABS: 40.0000 mg | ORAL_TABLET | Freq: Every day | ORAL | 3 refills | Status: DC

## 2018-02-28 MED ORDER — ASPIRIN 81 MG PO CHEW: 81.0000 mg | CHEWABLE_TABLET | Freq: Every day | ORAL | Status: DC

## 2018-02-28 MED ORDER — METOPROLOL TARTRATE 25 MG PO TABS: 12.5000 mg | ORAL_TABLET | Freq: Two times a day (BID) | ORAL | 3 refills | Status: DC

## 2018-02-28 MED ORDER — AMLODIPINE BESYLATE 2.5 MG PO TABS: 2.5000 mg | ORAL_TABLET | Freq: Every day | ORAL | 3 refills | Status: DC

## 2018-02-28 NOTE — Progress Notes (Signed)
Progress Note  Patient Name: Diana Campos Date of Encounter: 02/28/2018  Primary Cardiologist: Dr Harrington Challenger  Subjective   Patient has mild chest pain with deep inspiration.  She otherwise denies dyspnea.  Inpatient Medications    Scheduled Meds: . amLODipine  2.5 mg Oral Daily  . aspirin  81 mg Oral Daily  . atorvastatin  40 mg Oral q1800  . metoprolol tartrate  12.5 mg Oral BID  . sodium chloride flush  3 mL Intravenous Q12H   Continuous Infusions: . sodium chloride 250 mL (02/26/18 1858)   PRN Meds: sodium chloride, acetaminophen, morphine injection, ondansetron (ZOFRAN) IV, sodium chloride flush   Vital Signs    Vitals:   02/27/18 2126 02/27/18 2324 02/28/18 0359 02/28/18 0756  BP: 111/82 121/70 111/74 123/76  Pulse: 77  76   Resp:  (!) 25 17 19   Temp:  98.4 F (36.9 C) 98.5 F (36.9 C) 98.4 F (36.9 C)  TempSrc:  Oral Oral Oral  SpO2:  98% 100%   Weight:      Height:        Intake/Output Summary (Last 24 hours) at 02/28/2018 0931 Last data filed at 02/27/2018 1500 Gross per 24 hour  Intake 240 ml  Output 0 ml  Net 240 ml   Filed Weights   02/25/18 0156 02/25/18 0518  Weight: 121 kg 127.1 kg    Telemetry    Sinus- Personally Reviewed  Physical Exam   GEN: No acute distress.   Neck: No JVD Cardiac: RRR, no murmurs, rubs, or gallops.  Respiratory: Clear to auscultation bilaterally. GI: Soft, nontender, non-distended  MS: No edema Neuro:  Nonfocal  Psych: Normal affect   Labs    Chemistry Recent Labs  Lab 02/25/18 0214 02/26/18 0217  NA 138 135  K 3.5 3.6  CL 104 104  CO2 23 25  GLUCOSE 108* 103*  BUN 14 10  CREATININE 0.98 0.93  CALCIUM 9.5 8.4*  GFRNONAA >60 >60  GFRAA >60 >60  ANIONGAP 11 6     Hematology Recent Labs  Lab 02/26/18 0217 02/27/18 0312 02/28/18 0221  WBC 11.9* 10.3 9.0  RBC 4.45 4.06 4.34  HGB 13.0 12.0 12.6  HCT 40.6 36.7 39.5  MCV 91.2 90.4 91.0  MCH 29.2 29.6 29.0  MCHC 32.0 32.7 31.9  RDW 13.5  13.4 13.4  PLT 277 247 283    Cardiac Enzymes Recent Labs  Lab 02/25/18 0214 02/25/18 0532 02/25/18 1219 02/25/18 1611  TROPONINI 0.95* 2.81* 2.67* 1.81*    Recent Labs  Lab 02/25/18 0219  TROPIPOC 0.88*      Patient Profile     52 y.o. female admitted with non-ST elevation myocardial infarction.  Cardiac catheterization revealed ejection fraction 35%, left ventricular end-diastolic pressure 25, apical akinesis/dyskinesis consistent with takotsubo cardiomyopathy.  Follow-up echocardiogram shows ejection fraction 55% with apical akinesis.  Assessment & Plan    1 non-ST elevation myocardial infarction-plan to continue aspirin, statin and beta-blocker.  Catheterization revealed no obstructive coronary disease.  2 Takotsubo CM-continue beta-blocker at present dose.  Follow-up echocardiogram showed improvement in LV function but there remains apical wall motion abnormality.  Would likely plan to repeat echocardiogram in 3 months.  3 hyperlipidemia-continue statin.  In 4 to 6 weeks check lipids and liver.  4 morbid obesity-needs weight loss.  Plan discharge today.  Follow-up with APP in 2 weeks.  Follow-up Dr. Harrington Challenger 3 months.  Greater than 30 minutes PA and physician time. D2  For questions or  updates, please contact [place] Please consult [email or web address] for contact info under        Signed, Kirk Ruths, MD  02/28/2018, 9:31 AM

## 2018-02-28 NOTE — Discharge Summary (Signed)
Discharge Summary    Patient ID: Diana Campos,  MRN: [id number], DOB/AGE: 50-May-1967 50 y.o.  Admit date: 02/25/2018 Discharge date: 02/28/2018  Primary Care Provider: Patient, No Pcp Per Primary Cardiologist: No primary care provider on file.  Discharge Diagnoses    Principal Problem:   NSTEMI (non-ST elevated myocardial infarction) [facility] Active Problems:   Obesity   Dyslipidemia   Takotsubo cardiomyopathy   Allergies No Known Allergies  Diagnostic Studies/Procedures    Left heart catheterization 02/25/18:  Right dominant coronary anatomy  Normal left main  Widely patent LAD that wraps around the left ventricular apex without significant obstruction.  Distal LAD caliber less than anticipated although no areas of angiographic obstruction, possibly due to dyskinesis of the anterior wall.  Cannot totally exclude the possibility of SCAD.  Normal circumflex  Normal ramus intermedius  Anteroapical akinesis/dyskinesis compatible with Takotsubo/stress cardiomyopathy  Acute systolic heart failure with EF 35% and LVEDP 25 mmHg.  RECOMMENDATIONS:   Guideline directed therapy for systolic heart failure -ACE/ARB and beta-blocker therapy as tolerated by blood pressure.  IV heparin or subcu Lovenox given dyskinetic LV apex.  Hopefully LV recovery will begin soon and risk of thrombus formation will significantly abate.  Aspirin 81 mg/day.  Agree with therapy of severely elevated statins.  Consider evaluation for sleep apnea.  Having ongoing chest discomfort that is assumed related to stress cardiomyopathy syndrome.  She is at increased risk for progression to hemodynamic compromise.    Opiate therapy for control of pain.  Recommend Aspirin 81mg  daily for moderate CAD.  Echocardiogram 02/26/18: Study Conclusions  - Left ventricle: The cavity size was normal. Wall thickness was   normal. Severe hypokinesis to akinesis of the apex. The estimated   ejection  fraction was 55%. Doppler parameters are consistent with   abnormal left ventricular relaxation (grade 1 diastolic   dysfunction). - Aortic valve: There was no stenosis. - Mitral valve: There was no significant regurgitation. - Right ventricle: The cavity size was normal. Systolic function   was normal. - Pulmonary arteries: No complete TR doppler jet so unable to   estimate PA systolic pressure. - Inferior vena cava: The vessel was normal in size. The   respirophasic diameter changes were in the normal range (>= 50%),   consistent with normal central venous pressure.  Impressions:  - Normal LV size with EF 55%. The apical tip of the LV is severely   hypokinetic to akinetic. Normal RV size and systolic function. No   significant valvular abnormalities. _____________   History of Present Illness     52 y.o. female with a history of anxiety and morbid obesity presented 02/25/18 with complaints of chest pain.  Patient reports having central chest pressure after going on a date tonight and attempting to be intimate.  She initially had a headache and later developed chest pain.  The pain radiated to her right shoulder and arm as well as to her throat and jaw.  She states that the pain lasted for approximately an hour and was not relieved till she presented to the emergency department and was given nitroglycerin.  She did not have any associated diaphoresis nausea or vomiting.  She also denies any palpitations, presyncope or syncope.  She had a similar episode but of a milder in intensity approximately 1 year ago.  She has not had any recurrences since.  She works 2 jobs.  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  She is able to lift heavy objects without any issues.  She denies any orthopnea, paroxysmal nocturnal dyspnea or lower extremity edema.  She also denies any fevers or chills.  In the hospital her ECG only showed sinus rhythm with  poor R wave progression.  Her troponin was 0.95.  She was given 324 mg of aspirin, IV heparin and nitroglycerin.   Hospital Course     Consultants: None   1. NSTEMI: patient presented with chest pain. EKG non-ischemic. Troponin peaked at 2.81 and trended down. She underwent a LHC 02/25/18 which revealed normal coronaries with the exception of distal LAD being smaller than expected without obstruction, and anteroapical akinesis/dyskinesis compatible with Takotsubo cardiomyopathy with EF estimated at 35%. She was recommended for medical management of systolic CHF. Echo 02/26/18 showed EF 55%, G1DD, severe hypokinesis to akinesis of the apex. She was started on a BBlocker and low dose amlodpine started for possible spasm.  - Continue risk factor modifications  2. Takotsubo Cardiomyopathy: LHC 02/25/18 with c/w takotsubo given anteroapical akinesis/dyskinesis; EF estimated to be 35%. Echo 02/26/18 showed EF 55%, G1DD, severe hypokinesis to akinesis of the apex. She was started on a BBlocker.  - Continue bblocker  - Plan for repeat echocardiogram in 3 months  3. Dyslipidemia: LDL 158 this admission. Started on atorvastatin 40mg  daily - Continue statin for risk factor modification  4. Morbid obesity: Would benefit significantly from weight loss.  - Continue to encouraged to make dietary and lifestyle modifications to promote weight loss  5. Suspected OSA:  - Consider outpatient sleep study at follow-up visit   _____________  Discharge Vitals Blood pressure 123/76, pulse 76, temperature 98.4 F (36.9 C), temperature source Oral, resp. rate 19, height 5\' 8"  (1.727 m), weight 127.1 kg, SpO2 100 %.  Filed Weights   02/25/18 0156 02/25/18 0518  Weight: 121 kg 127.1 kg    Labs & Radiologic Studies    CBC Recent Labs    02/27/18 0312 02/28/18 0221  WBC 10.3 9.0  HGB 12.0 12.6  HCT 36.7 39.5  MCV 90.4 91.0  PLT 247 275   Basic Metabolic Panel Recent Labs    02/26/18 0217  NA 135   K 3.6  CL 104  CO2 25  GLUCOSE 103*  BUN 10  CREATININE 0.93  CALCIUM 8.4*   Liver Function Tests No results for input(s): AST, ALT, ALKPHOS, BILITOT, PROT, ALBUMIN in the last 72 hours. No results for input(s): LIPASE, AMYLASE in the last 72 hours. Cardiac Enzymes Recent Labs    02/25/18 1219 02/25/18 1611  TROPONINI 2.67* 1.81*   BNP Invalid input(s): POCBNP D-Dimer No results for input(s): DDIMER in the last 72 hours. Hemoglobin A1C No results for input(s): HGBA1C in the last 72 hours. Fasting Lipid Panel No results for input(s): CHOL, HDL, LDLCALC, TRIG, CHOLHDL, LDLDIRECT in the last 72 hours. Thyroid Function Tests No results for input(s): TSH, T4TOTAL, T3FREE, THYROIDAB in the last 72 hours.  Invalid input(s): FREET3 _____________  Dg Chest 2 View  Result Date: 02/25/2018 CLINICAL DATA:  Headache beginning during intercourse tonight. Now central chest pain and pain radiating to the throat and shoulder. EXAM: CHEST - 2 VIEW COMPARISON:  None. FINDINGS: Thoracic scoliosis convex towards the right. Normal heart size and pulmonary vascularity. No focal airspace disease or consolidation in the lungs. No blunting of costophrenic angles. No pneumothorax. Mediastinal contours appear intact. IMPRESSION: No active cardiopulmonary disease. Electronically Signed   By:  Lucienne Capers M.D.   On: 02/25/2018 02:35   Disposition   Patient was seen and examined by Dr. Stanford Breed who deemed patient as stable for discharge. Follow-up has been arranged. Discharge medications as listed below.   Follow-up Plans & Appointments    Follow-up Information    Daune Perch, NP Follow up on 03/16/2018.   Specialty:  Nurse Practitioner Why:  Please arrive 15 minutes early for your 9:00am post-hospital cardiology appointment Contact information: [place] [address] [phone]          Discharge Instructions    Amb Referral to Cardiac Rehabilitation    Complete by:  As directed    Diagnosis:  NSTEMI   Diet - low sodium heart healthy   Complete by:  As directed    Increase activity slowly   Complete by:  As directed       Discharge Medications   Allergies as of 02/28/2018   No Known Allergies     Medication List    STOP taking these medications   amitriptyline 10 MG tablet Commonly known as:  ELAVIL   diazepam 2 MG tablet Commonly known as:  VALIUM   metroNIDAZOLE 1.3 % Gel     TAKE these medications   amLODipine 2.5 MG tablet Commonly known as:  NORVASC Take 1 tablet (2.5 mg total) by mouth daily. Start taking on:  03/01/2018   aspirin 81 MG chewable tablet Chew 1 tablet (81 mg total) by mouth daily. Start taking on:  03/01/2018   atorvastatin 40 MG tablet Commonly known as:  LIPITOR Take 1 tablet (40 mg total) by mouth daily at 6 PM.   metoprolol tartrate 25 MG tablet Commonly known as:  LOPRESSOR Take 0.5 tablets (12.5 mg total) by mouth 2 (two) times daily.         Outstanding Labs/Studies   Will need repeat lipids/ LFTs in 4-6 weeks.   Plan for repeat echo in 3 months.  Duration of Discharge Encounter   Greater than 30 minutes including physician time.  Signed, Abigail Butts PA-C 02/28/2018, 10:40 AM

## 2018-02-28 NOTE — Discharge Instructions (Signed)

## 2018-02-28 NOTE — Progress Notes (Signed)
CARDIAC REHAB PHASE I   PRE:  Rate/Rhythm: 72 SR    BP: sitting 120/84    SaO2:   MODE:  Ambulation: 340 ft   POST:  Rate/Rhythm: 87 SR    BP: sitting 121/69     SaO2: 97 RA  Pt sts she has chest tightness with deep breath and also while talking and walking. Able to walk hall at slightly faster pace today. But she struggled to talk and walk, voice quiet while walking. VSS. Ed completed with pt and daughter, good reception. Will refer to [place]. [phone]   [place], ACSM 02/28/2018 10:19 AM

## 2018-03-02 ENCOUNTER — Telehealth (HOSPITAL_COMMUNITY): Payer: Self-pay

## 2018-03-02 ENCOUNTER — Telehealth: Payer: Self-pay | Admitting: Internal Medicine

## 2018-03-02 DIAGNOSIS — R0602 Shortness of breath: Secondary | ICD-10-CM

## 2018-03-02 DIAGNOSIS — I5181 Takotsubo syndrome: Secondary | ICD-10-CM

## 2018-03-02 NOTE — Telephone Encounter (Signed)
Pt insurance is active and benefits verified through [employer]/ Co-pay $0.00, DED $2,700.00/$2,071.71 met, out of pocket $5,400.00/$2,095.69 met, co-insurance 20%. No pre-authorization required. Passport, 03/02/18 @ 2:16PM, REF# [id number]  Will contact patient to see if she is interested in the Cardiac Rehab Program. If interested, patient will need to complete follow up appt. Once completed, patient will be contacted for scheduling upon review by the RN Navigator.

## 2018-03-02 NOTE — Telephone Encounter (Signed)
Called patient re: breathing. She is SOB on the phone.  She said this occurs when talking that she cant finish a sentence without taking a breath.   Also notices today SOB after going upstairs.  This did not occur yesterday, she went up/down twice.   At rest she is not SOB. Right wrist is just scabbed at site, no swelling, not cool or warm to touch.  Tripped over shoes yesterday and had caught herself falling with this wrist/hand.  There is no tingling/numbness reported.  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 FMLA paperwork is being forwarded to Dr. Harrington Challenger. She was told at DC no work until after being seen in follow up but said her DC paperwork says 3 days.  Adv to rest today, adv if SOB worsens she needs to go to ER for evaluation.

## 2018-03-02 NOTE — Telephone Encounter (Signed)
New message   Patient needs clarification on when to go back to work after discharge from hospital on 02/28/2018. Please advise.

## 2018-03-02 NOTE — Telephone Encounter (Signed)
**Note De-Identified Diana Campos Obfuscation** I called the pt to do a TCM Call and she advised me that she is waiting on a call back from Dr Diana Campos nurse concerning:  1. She states that the MD who discharged her told her she will not go back to work until after her f/u on 12/9 with Diana Perch, NP but her discharge instructions states 3 days. And 2. She also wants Dr Diana Campos to be aware that she is having to stop and take a breath while she is talking and this is not normal for her and was not happening while she was in the hospital.   She is advised that if this worsens to call us back.  She is advised that I am sending this message to Dr Diana Campos and her nurse for advisement.

## 2018-03-02 NOTE — Telephone Encounter (Signed)
Reviewed symptoms with Dr. Harrington Challenger who recommends BMET, BNP, CBC and limited echo, tomorrow if possible. Informed patient.   Appointments scheduled for tomorrow. Message sent to precert.

## 2018-03-02 NOTE — Telephone Encounter (Signed)
Patient contacted regarding discharge from [facility] on 02/28/18.  Patient understands to follow up with provider Daune Perch, NP on 03/16/18 at 9:00 at [facility] in [place]. Patient understands discharge instructions? Yes Patient understands medications and regiment? Yes Patient understands to bring all medications to this visit? Yes  Please see phone not from earlier today that was sent to Dr Harrington Challenger concerning the pt having to stop and take a breath while she is talking and this is not normal for her and was not happening while she was in the hospital.   Otherwise the pt has no complaints at this time.

## 2018-03-03 ENCOUNTER — Ambulatory Visit (HOSPITAL_COMMUNITY): Payer: 59 | Attending: Cardiovascular Disease

## 2018-03-03 ENCOUNTER — Other Ambulatory Visit: Payer: 59 | Admitting: *Deleted

## 2018-03-03 ENCOUNTER — Other Ambulatory Visit: Payer: Self-pay

## 2018-03-03 DIAGNOSIS — R0602 Shortness of breath: Secondary | ICD-10-CM | POA: Diagnosis present

## 2018-03-03 DIAGNOSIS — I5181 Takotsubo syndrome: Secondary | ICD-10-CM

## 2018-03-03 NOTE — Telephone Encounter (Signed)
Called patient to see if she is interested in the Cardiac Rehab Program. Patient expressed interest. Explained scheduling process and went over insurance, patient verbalized understanding. Will contact patient for scheduling once f/u has been completed. 

## 2018-03-04 LAB — BASIC METABOLIC PANEL
BUN / CREAT RATIO: 13 (ref 9–23)
BUN: 14 mg/dL (ref 6–24)
CHLORIDE: 103 mmol/L (ref 96–106)
CO2: 20 mmol/L (ref 20–29)
CREATININE: 1.05 mg/dL — AB (ref 0.57–1.00)
Calcium: 9.3 mg/dL (ref 8.7–10.2)
GFR calc Af Amer: 72 mL/min/{1.73_m2} (ref >59)
GFR calc non Af Amer: 62 mL/min/{1.73_m2} (ref >59)
Glucose: 112 mg/dL — ABNORMAL HIGH (ref 65–99)
POTASSIUM: 4 mmol/L (ref 3.5–5.2)
SODIUM: 140 mmol/L (ref 134–144)

## 2018-03-04 LAB — CBC
Hematocrit: 43.4 % (ref 34.0–46.6)
Hemoglobin: 14.5 g/dL (ref 11.1–15.9)
MCH: 29.5 pg (ref 26.6–33.0)
MCHC: 33.4 g/dL (ref 31.5–35.7)
MCV: 88 fL (ref 79–97)
PLATELETS: 381 10*3/uL (ref 150–450)
RBC: 4.91 x10E6/uL (ref 3.77–5.28)
RDW: 13 % (ref 12.3–15.4)
WBC: 7.9 10*3/uL (ref 3.4–10.8)

## 2018-03-04 LAB — PRO B NATRIURETIC PEPTIDE: NT-PRO BNP: 107 pg/mL (ref 0–249)

## 2018-03-16 ENCOUNTER — Ambulatory Visit (INDEPENDENT_AMBULATORY_CARE_PROVIDER_SITE_OTHER): Payer: 59 | Admitting: Cardiology

## 2018-03-16 ENCOUNTER — Encounter: Payer: Self-pay | Admitting: Cardiology

## 2018-03-16 VITALS — BP 124/78 | HR 73 | Ht 68.0 in | Wt 288.4 lb

## 2018-03-16 DIAGNOSIS — R0683 Snoring: Secondary | ICD-10-CM

## 2018-03-16 DIAGNOSIS — I5181 Takotsubo syndrome: Secondary | ICD-10-CM

## 2018-03-16 DIAGNOSIS — Z6841 Body Mass Index (BMI) 40.0 and over, adult: Secondary | ICD-10-CM

## 2018-03-16 DIAGNOSIS — E785 Hyperlipidemia, unspecified: Secondary | ICD-10-CM | POA: Diagnosis not present

## 2018-03-16 DIAGNOSIS — R0602 Shortness of breath: Secondary | ICD-10-CM

## 2018-03-16 DIAGNOSIS — I214 Non-ST elevation (NSTEMI) myocardial infarction: Secondary | ICD-10-CM | POA: Diagnosis not present

## 2018-03-16 LAB — PRO B NATRIURETIC PEPTIDE: NT-Pro BNP: 24 pg/mL (ref 0–249)

## 2018-03-16 LAB — D-DIMER, QUANTITATIVE: D-DIMER: 0.49 mg/L FEU (ref 0.00–0.49)

## 2018-03-16 NOTE — Patient Instructions (Addendum)
Medication Instructions:  Your physician recommends that you continue on your current medications as directed. Please refer to the Current Medication list given to you today.  If you need a refill on your cardiac medications before your next appointment, please call your pharmacy.   Lab work: TODAY: STAT D-DIMER  Your physician recommends that you return for a FASTING lipid profile and Hepatic function test on 04/27/18 lab is open from 7:30 AM - 5 PM you can come at any time between those hours    If you have labs (blood work) drawn today and your tests are completely normal, you will receive your results only by: Marland Kitchen MyChart Message (if you have MyChart) OR . A paper copy in the mail If you have any lab test that is abnormal or we need to change your treatment, we will call you to review the results.  Testing/Procedures: Your physician has recommended that you have a sleep study. This test records several body functions during sleep, including: brain activity, eye movement, oxygen and carbon dioxide blood levels, heart rate and rhythm, breathing rate and rhythm, the flow of air through your mouth and nose, snoring, body muscle movements, and chest and belly movement. Someone will contact you with an appointment  Follow-Up: At [facility], you and your health needs are our priority.  As part of our continuing mission to provide you with exceptional heart care, we have created designated Provider Care Teams.  These Care Teams include your primary Cardiologist (physician) and Advanced Practice Providers (APPs -  Physician Assistants and Nurse Practitioners) who all work together to provide you with the care you need, when you need it. You will need a follow up appointment in:  3 months. You may see Dr. Harrington Challenger or one of the following Advanced Practice Providers on your designated Care Team: Richardson Dopp, PA-C [place], [place] . Daune Perch, NP  Any Other Special Instructions Will Be Listed  Below (If Applicable).  Exercise gently and build up to 30 minutes a day  '

## 2018-03-16 NOTE — Progress Notes (Signed)
Cardiology Office Note:    Date:  03/16/2018   ID:  Diana Campos, DOB 06/02/1965, MRN [id number]  PCP:  Patient, No Pcp Per  Cardiologist:  Dorris Carnes, MD    Referring MD: No ref. provider found   Chief Complaint  Patient presents with  . Hospitalization Follow-up    Takotsubo cardiomyopathy    History of Present Illness:    Diana Campos is a 52 y.o. female with a past medical history significant for anxiety, morbid obesity and recent admission for chest pain, found to have Takotsubo CM.   She was admitted to the hospital on 02/25/2018 with chest pain.  EKG was nonischemic, only with poor R wave progression.  Troponin peaked at 2.81.  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  Echo 02/26/2018 showed EF 55%, G1 DD, severe hypokinesis to akinesis of the apex.  She was started on a beta-blocker and low-dose amlodipine for possible spasm.  Plan for repeat echocardiogram in 3 months.  She was found to have dyslipidemia with LDL of 158 and started on atorvastatin 40 mg daily.   She is here today for follow up. She has had mild chest pressure with right arm heaviness that does not really bother her. She is bothered by not being able to catch her breath after climbing 1 flight of stairs or when talking. She had a limited echo on 03/03/18 to evaluate these symptoms that showed normalized LV function with EF 60-65%.  Her symptoms seem to be slowly improving over time.  She has started trying to walk in her house and up and down her stairs are sized.  She has a treadmill at home but has not started using it.  She says that she feels 100% better than when she presented to the hospital.  Diana Campos tells me that she actually developed her chest pain prior to presenting to the  hospital after eating [place] food and was engaging in sexual activity.  Her symptoms resolved and then later she was having phone sex and again developed a headache that led to chest pain that subsequently went to her throat and neck.  She has had none of these type of symptoms since she has been home from the hospital.  She has also not tried to have any sex.  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  Prior to her hospitalization when she was also working 3 night shifts per week in addition.  She has not been back to work since her hospitalization.  We discussed returning to her Monday through Friday job which she prefers to see how she is doing as she increases her activity.  Will provide a note to return to work on 03/24/2018.  We also discussed perhaps leaving off her nighttime job for a while in order to reduce her daily stress.  Past Medical History:  Diagnosis Date  . Acute heart failure ([place])   . NSTEMI (non-ST elevated myocardial infarction) ([place]) 02/26/2018    Past Surgical History:  Procedure Laterality Date  . ABDOMINAL HYSTERECTOMY    . BACK SURGERY    . CARDIAC CATHETERIZATION  02/17/2018  . LEFT HEART CATH AND CORONARY ANGIOGRAPHY N/A 02/25/2018   Procedure: LEFT HEART CATH AND CORONARY ANGIOGRAPHY;  Surgeon: Belva Crome, MD;  Location: [facility]  INVASIVE CV LAB;  Service: Cardiovascular;  Laterality: N/A;  . TUBAL LIGATION    . ULTRASOUND GUIDANCE FOR VASCULAR ACCESS  02/25/2018   Procedure: Ultrasound Guidance For Vascular Access;  Surgeon: Belva Crome, MD;  Location: [facility] CV LAB;  Service: Cardiovascular;;    Current Medications: Current Meds  Medication Sig  . amLODipine (NORVASC) 2.5 MG tablet Take 1 tablet (2.5 mg total) by mouth daily.  Marland Kitchen aspirin 81 MG chewable tablet Chew 1 tablet (81 mg total) by mouth daily.  Marland Kitchen atorvastatin (LIPITOR) 40 MG tablet Take 1 tablet (40 mg total) by mouth daily at 6 PM.  . metoprolol  tartrate (LOPRESSOR) 25 MG tablet Take 0.5 tablets (12.5 mg total) by mouth 2 (two) times daily.  . nitroGLYCERIN (NITROSTAT) 0.4 MG SL tablet Place 1 tablet (0.4 mg total) under the tongue every 5 (five) minutes as needed.     Allergies:   Patient has no known allergies.   Social History   Socioeconomic History  . Marital status: Divorced    Spouse name: Not on file  . Number of children: Not on file  . Years of education: Not on file  . Highest education level: Not on file  Occupational History  . Not on file  Social Needs  . Financial resource strain: Not on file  . Food insecurity:    Worry: Not on file    Inability: Not on file  . Transportation needs:    Medical: Not on file    Non-medical: Not on file  Tobacco Use  . Smoking status: Never Smoker  . Smokeless tobacco: Never Used  Substance and Sexual Activity  . Alcohol use: Yes    Comment: occ  . Drug use: No  . Sexual activity: Yes    Partners: Male  Lifestyle  . Physical activity:    Days per week: Not on file    Minutes per session: Not on file  . Stress: Not on file  Relationships  . Social connections:    Talks on phone: Not on file    Gets together: Not on file    Attends religious service: Not on file    Active member of club or organization: Not on file    Attends meetings of clubs or organizations: Not on file    Relationship status: Not on file  Other Topics Concern  . Not on file  Social History Narrative  . Not on file     Family History: The patient's family history includes Cancer in her paternal grandmother; Hypertension in her mother. ROS:   Please see the history of present illness.     All other systems reviewed and are negative.  EKGs/Labs/Other Studies Reviewed:    The following studies were reviewed today:  Limited echo 03/03/18 Study Conclusions - Left ventricle: The cavity size was normal. There was mild focal   basal hypertrophy of the septum. Systolic function was  normal.   The estimated ejection fraction was in the range of 60% to 65%.   Wall motion was normal; there were no regional wall motion   abnormalities. Doppler parameters are consistent with abnormal   left ventricular relaxation (grade 1 diastolic dysfunction). - Aortic valve: Transvalvular velocity was within the normal range.   There was no stenosis. There was no regurgitation. - Mitral valve: Transvalvular velocity was within the normal range.   There was no evidence for stenosis. There was no regurgitation. - Right ventricle: The cavity size was normal. Wall  thickness was   normal. Systolic function was normal. - Atrial septum: No defect or patent foramen ovale was identified   by color flow Doppler. - Tricuspid valve: There was no regurgitation.  Impressions: - Apical akinesis no longer present.  Left heart catheterization 02/25/18:  Right dominant coronary anatomy  Normal left main  Widely patent LAD that wraps around the left ventricular apex without significant obstruction. Distal LAD caliber less than anticipated although no areas of angiographic obstruction, possibly due to dyskinesis of the anterior wall. Cannot totally exclude the possibility of SCAD.  Normal circumflex  Normal ramus intermedius  Anteroapical akinesis/dyskinesis compatible with Takotsubo/stress cardiomyopathy  Acute systolic heart failure with EF 35% and LVEDP 25 mmHg.  RECOMMENDATIONS:  Guideline directed therapy for systolic heart failure -ACE/ARB and beta-blocker therapy as tolerated by blood pressure.  IV heparin or subcu Lovenox given dyskinetic LV apex. Hopefully LV recovery will begin soon and risk of thrombus formation will significantly abate.  Aspirin 81 mg/day.  Agree with therapy of severely elevated statins.  Consider evaluation for sleep apnea.  Having ongoing chest discomfort that is assumed related to stress cardiomyopathy syndrome. She is at increased risk for  progression to hemodynamic compromise.   Opiate therapy for control of pain. Recommend Aspirin 81mg  daily for moderate CAD.  Echocardiogram 02/26/18: Study Conclusions - Left ventricle: The cavity size was normal. Wall thickness was normal. Severe hypokinesis to akinesis of the apex. The estimated ejection fraction was 55%. Doppler parameters are consistent with abnormal left ventricular relaxation (grade 1 diastolic dysfunction). - Aortic valve: There was no stenosis. - Mitral valve: There was no significant regurgitation. - Right ventricle: The cavity size was normal. Systolic function was normal. - Pulmonary arteries: No complete TR doppler jet so unable to estimate PA systolic pressure. - Inferior vena cava: The vessel was normal in size. The respirophasic diameter changes were in the normal range (>= 50%), consistent with normal central venous pressure.  Impressions: - Normal LV size with EF 55%. The apical tip of the LV is severely hypokinetic to akinetic. Normal RV size and systolic function. No significant valvular abnormalities.  EKG:  EKG is not ordered today.    Recent Labs: 03/03/2018: BUN 14; Creatinine, Ser 1.05; Hemoglobin 14.5; Platelets 381; Potassium 4.0; Sodium 140 03/16/2018: NT-Pro BNP 24   Recent Lipid Panel    Component Value Date/Time   CHOL 206 (H) 02/25/2018 0532   TRIG 66 02/25/2018 0532   HDL 35 (L) 02/25/2018 0532   CHOLHDL 5.9 02/25/2018 0532   VLDL 13 02/25/2018 0532   LDLCALC 158 (H) 02/25/2018 0532    Physical Exam:    VS:  BP 124/78   Pulse 73   Ht 5\' 8"  (1.727 m)   Wt 288 lb 6.4 oz (130.8 kg)   SpO2 97%   BMI 43.85 kg/m     Wt Readings from Last 3 Encounters:  03/16/18 288 lb 6.4 oz (130.8 kg)  02/25/18 280 lb 3.3 oz (127.1 kg)  09/02/17 268 lb (121.6 kg)     Physical Exam  Constitutional: She is oriented to person, place, and time. She appears well-developed and well-nourished. No distress.  HENT:    Head: Normocephalic and atraumatic.  Neck: Normal range of motion. Neck supple.  Cardiovascular: Normal rate, regular rhythm, normal heart sounds and intact distal pulses. Exam reveals no gallop and no friction rub.  No murmur heard. Pulmonary/Chest: Effort normal and breath sounds normal. No respiratory distress. She has no wheezes. She has no  rales.  Abdominal: Soft. Bowel sounds are normal.  Musculoskeletal: Normal range of motion.  Bilateral trace pretibial edema  Neurological: She is alert and oriented to person, place, and time.  Skin: Skin is warm and dry.  Psychiatric: She has a normal mood and affect. Her behavior is normal. Judgment and thought content normal.  Vitals reviewed.    ASSESSMENT:    1. NSTEMI (non-ST elevated myocardial infarction) ([place])   2. Takotsubo cardiomyopathy   3. Dyslipidemia   4. Class 3 severe obesity due to excess calories with serious comorbidity and body mass index (BMI) of 40.0 to 44.9 in adult ([place])   5. SOB (shortness of breath)   6. Snoring    PLAN:    In order of problems listed above:  1.  Non-STEMI: Troponins elevated to 2.81 during hospital admission 02/25/2018.  Left heart cath showed normal coronary arteries with the exception of distal LAD being very small with no obstruction.  EF 35% at cath and patient diagnosed with stress cardiomyopathy. Pt will start gently exercise on treadmill this week and OK to return to work next week on 03/24/18.  She will try to reduce her work schedule by cutting out her extra night shifts in order to reduce her daily stress level.  2.  Takotsubo cardiomyopathy: Patient presented to the hospital with chest pain and elevated troponins.  EF 35% on cath 02/25/2018, echo 02/26/2018 showed EF 55% with severe apical hypokinesis/akinesis.  Started on beta-blocker.  Follow-up limited echo on 03/03/2018 showed normalized LV function and wall motion, EF 60-65%.   4. Shortness of breath: continued DOE with climbing  stairs and talking. Her LV function has returned to normal. Could be releated to obesity and sedentary lifestyle with recent deconditioning after her hospitalization.  She has very mild pretibial edema.  No orthopnea.  Will check d-dimer to evaluate for possible PE and BNP for potential volume overload.  She does not appear volume overloaded. Reevaluate at follow-up.she has implemented exercise program.  5.  Dyslipidemia: LDL 158 on 02/25/2018.  Atorvastatin 40 mg daily was added.  Plan to repeat lipid panel and LFTs in 6 weeks.  6.  Morbid obesity: Body mass index is 43.85 kg/m. Discussed beginning an exercise program on her treadmill, starting with 5-10 minutes and working her way up to 30 minutes daily.  Also discussed heart healthy diet and portion control, avoidance of sugar sweetened beverages, switching to whole grains and limiting unhealthy snacks.  5.  Suspected OSA: Snoring reported by her daughter. Will check sleep study to assess for sleep apnea.    Medication Adjustments/Labs and Tests Ordered: Current medicines are reviewed at length with the patient today.  Concerns regarding medicines are outlined above. Labs and tests ordered and medication changes are outlined in the patient instructions below:  Patient Instructions  Medication Instructions:  Your physician recommends that you continue on your current medications as directed. Please refer to the Current Medication list given to you today.  If you need a refill on your cardiac medications before your next appointment, please call your pharmacy.   Lab work: TODAY: STAT D-DIMER  Your physician recommends that you return for a FASTING lipid profile and Hepatic function test on 04/27/18 lab is open from 7:30 AM - 5 PM you can come at any time between those hours    If you have labs (blood work) drawn today and your tests are completely normal, you will receive your results only by: Marland Kitchen MyChart Message (if you  have MyChart)  OR . A paper copy in the mail If you have any lab test that is abnormal or we need to change your treatment, we will call you to review the results.  Testing/Procedures: Your physician has recommended that you have a sleep study. This test records several body functions during sleep, including: brain activity, eye movement, oxygen and carbon dioxide blood levels, heart rate and rhythm, breathing rate and rhythm, the flow of air through your mouth and nose, snoring, body muscle movements, and chest and belly movement. Someone will contact you with an appointment  Follow-Up: At [facility], you and your health needs are our priority.  As part of our continuing mission to provide you with exceptional heart care, we have created designated Provider Care Teams.  These Care Teams include your primary Cardiologist (physician) and Advanced Practice Providers (APPs -  Physician Assistants and Nurse Practitioners) who all work together to provide you with the care you need, when you need it. You will need a follow up appointment in:  3 months. You may see Dr. Harrington Challenger or one of the following Advanced Practice Providers on your designated Care Team: Richardson Dopp, PA-C [place], [place] . Daune Perch, NP  Any Other Special Instructions Will Be Listed Below (If Applicable).  Exercise gently and build up to 30 minutes a day  '       Signed, Daune Perch, NP  03/16/2018 6:14 PM    [facility]

## 2018-03-19 NOTE — Telephone Encounter (Signed)
Called pt in regards to CR, pt stated she is not able to participate at this time due to her work schedule.  Closed referral

## 2018-04-16 ENCOUNTER — Telehealth: Payer: Self-pay | Admitting: Cardiology

## 2018-04-16 NOTE — Telephone Encounter (Signed)
Left message for pt with recommendations per Dr. Harrington Challenger. Please call the office if any questions or if edema is getting worse. If edema worsens may try lasix 20 mg 1-2 times weekly at the most per Dr. Harrington Challenger.

## 2018-04-16 NOTE — Telephone Encounter (Signed)
New Message   Received a my chart message from patient in reference to swelling that she has in her feet and ankles. She did not provide any additional information in reference to weight gain or shortness of breath. Please contact patient.

## 2018-04-16 NOTE — Telephone Encounter (Signed)
Agree with recommendations   I saw the pt when she was hospitalized in November   Pumping function of the heart is normal Watch salt intake   2 to 3 gram Na per day Try to keep legs moving, increase venous return If continues, could try lasix 20 mg one or two times per week at most

## 2018-04-16 NOTE — Telephone Encounter (Signed)
I s/w pt who states she has some edema in her ankles. Pt states it is worse more in the evening. Ankles are down some in the morning after waking. Pt is on Amlodipine 2.5 mg daily. I stated I felt that this low dose of Amlodipine 2.5 mg is may or may not be the cause of her swelling. Pt denies any chest pain, says still a little sob though this is better than it was recently. Pt works from home for [employer]. I did ask if she can put her feet up during the day. Pt states she uses a foot stool while working.   Pt states her weight is stable and she has not gained weight. We discussed watching Na intake and fluid intake. Pt states she is probably not drinking enough water like she should. States she does not add salt to her food though does state she eats some canned soups, though she gets low sodium. I advised to limit these types of foods as even though low salt, they still have quite a bit of salt. I said I will d/w Dr. Harrington Challenger for any further recommendations. Pt thanked me for the call.

## 2018-04-27 ENCOUNTER — Other Ambulatory Visit: Payer: 59 | Admitting: *Deleted

## 2018-04-27 DIAGNOSIS — E785 Hyperlipidemia, unspecified: Secondary | ICD-10-CM

## 2018-04-27 LAB — LIPID PANEL
CHOLESTEROL TOTAL: 120 mg/dL (ref 100–199)
Chol/HDL Ratio: 3.5 ratio (ref 0.0–4.4)
HDL: 34 mg/dL — AB (ref >39)
LDL Calculated: 74 mg/dL (ref 0–99)
Triglycerides: 61 mg/dL (ref 0–149)
VLDL CHOLESTEROL CAL: 12 mg/dL (ref 5–40)

## 2018-04-27 LAB — HEPATIC FUNCTION PANEL
ALBUMIN: 3.8 g/dL (ref 3.8–4.8)
ALK PHOS: 80 IU/L (ref 39–117)
ALT: 12 IU/L (ref 0–32)
AST: 13 IU/L (ref 0–40)
BILIRUBIN, DIRECT: 0.15 mg/dL (ref 0.00–0.40)
Bilirubin Total: 0.5 mg/dL (ref 0.0–1.2)
Total Protein: 6.7 g/dL (ref 6.0–8.5)

## 2018-04-29 ENCOUNTER — Telehealth: Payer: Self-pay

## 2018-04-29 NOTE — Telephone Encounter (Signed)
-----   Message from Daune Perch, NP sent at 04/28/2018  1:39 PM EST ----- Cholesterol levels have improve on atorvastatin. LDL (bad cholesterol) is down from 158 to 74 which is really good. Continue current therapy.  Daune Perch, NP

## 2018-04-29 NOTE — Telephone Encounter (Signed)
Reviewed results with patient who verbalized understanding. 

## 2018-06-12 ENCOUNTER — Other Ambulatory Visit: Payer: Self-pay | Admitting: Obstetrics

## 2018-06-12 DIAGNOSIS — N760 Acute vaginitis: Principal | ICD-10-CM

## 2018-06-12 DIAGNOSIS — B9689 Other specified bacterial agents as the cause of diseases classified elsewhere: Secondary | ICD-10-CM

## 2018-06-19 ENCOUNTER — Other Ambulatory Visit (HOSPITAL_COMMUNITY): Payer: Self-pay | Admitting: Medical

## 2018-06-24 ENCOUNTER — Telehealth: Payer: Self-pay | Admitting: *Deleted

## 2018-06-24 NOTE — Telephone Encounter (Signed)
Called patient in reply to her my chart message.  Dizzy when first getting up in morning. She states has been staying hydrated.  BP checked in mornings only before medicines. - not dizzy when checking BP 120/87, 144/92, 150/101, 132/98, 135/92, 140/95 Now sitting at work today - dizzy at her desk. - lightheaded, vision blurry.  This part is new. Currently not symtomatic.  No falls, no syncope.  She does not have PCP, will work in getting one ASAP.   Adv to check BP in the afternoon, after medicines.  Aware I will send message to Dr. Harrington Challenger and will call her back with any other recommendations.

## 2018-06-25 ENCOUNTER — Telehealth: Payer: Self-pay | Admitting: Internal Medicine

## 2018-06-25 NOTE — Telephone Encounter (Signed)
Tried to call pt  No answer I would keep on samy meds   BP is OK Stay hydrated.

## 2018-06-25 NOTE — Telephone Encounter (Signed)
Agree with checking BP at different times of day esp when dizzy

## 2018-06-29 ENCOUNTER — Telehealth: Payer: Self-pay | Admitting: Internal Medicine

## 2018-06-29 MED ORDER — FUROSEMIDE 20 MG PO TABS: ORAL_TABLET | ORAL | 0 refills | Status: DC

## 2018-06-29 NOTE — Telephone Encounter (Signed)
Spoke to patient  She has had some dizziness   She said when she situs up and turns in bed room will spin Also, since heart event (Takosubo's) she has had SOB with activity   NOte that Pumping functoin normalized on echo in Nov 2019 She does not some swelling in feet that she never had before  Taking meds    NOw staggering with dizziness  REcomm: Rx lasix 20 mg to take 2x per wk   Email in mychart this weekend her response Postpone Friday appt until later spring/summer

## 2018-06-29 NOTE — Telephone Encounter (Signed)
Called pt and confirmed she will take lasix 20 mg twice a week.   She will use Mychart to update how she is feeling. Will send to r/s pool.

## 2018-07-03 ENCOUNTER — Ambulatory Visit: Payer: 59 | Admitting: Internal Medicine

## 2018-07-16 ENCOUNTER — Other Ambulatory Visit: Payer: Self-pay | Admitting: Obstetrics

## 2018-07-16 DIAGNOSIS — B9689 Other specified bacterial agents as the cause of diseases classified elsewhere: Secondary | ICD-10-CM

## 2018-07-16 DIAGNOSIS — N760 Acute vaginitis: Principal | ICD-10-CM

## 2018-07-24 ENCOUNTER — Telehealth: Payer: Self-pay

## 2018-07-24 NOTE — Telephone Encounter (Signed)
Reschedule OV to VIDEO with Dr. Harrington Challenger     Virtual Visit Pre-Appointment Phone Call  Steps For Call:  1. Confirm consent - "In the setting of the current Covid19 crisis, you are scheduled for a VIDEO visit with your provider on 4/23 at 9am.  Just as we do with many in-office visits, in order for you to participate in this visit, we must obtain consent.  If you'd like, I can send this to your mychart (if signed up) or email for you to review.  Otherwise, I can obtain your verbal consent now.  All virtual visits are billed to your insurance company just like a normal visit would be.  By agreeing to a virtual visit, we'd like you to understand that the technology does not allow for your provider to perform an examination, and thus may limit your provider's ability to fully assess your condition. If your provider identifies any concerns that need to be evaluated in person, we will make arrangements to do so.  Finally, though the technology is pretty good, we cannot assure that it will always work on either your or our end, and in the setting of a video visit, we may have to convert it to a phone-only visit.  In either situation, we cannot ensure that we have a secure connection.  Are you willing to proceed?" STAFF: Did the patient verbally acknowledge consent to telehealth visit? Document YES/NO here: YES  2. Confirm the BEST phone number to call the day of the visit by including in appointment notes  3. Give patient instructions for WebEx/MyChart download to smartphone as below or [email or web address] if video visit (depending on what platform provider is using)  4. Advise patient to be prepared with their blood pressure, heart rate, weight, any heart rhythm information, their current medicines, and a piece of paper and pen handy for any instructions they may receive the day of their visit  5. Inform patient they will receive a phone call 15 minutes prior to their appointment time (may be from unknown caller  ID) so they should be prepared to answer  6. Confirm that appointment type is correct in Epic appointment notes (VIDEO vs PHONE)     TELEPHONE CALL NOTE  Diana Campos has been deemed a candidate for a follow-up tele-health visit to limit community exposure during the Covid-19 pandemic. I spoke with the patient via phone to ensure availability of phone/video source, confirm preferred email & phone number, and discuss instructions and expectations.  I reminded Diana Campos to be prepared with any vital sign and/or heart rhythm information that could potentially be obtained via home monitoring, at the time of her visit. I reminded Diana Campos to expect a phone call at the time of her visit if her visit.  Diana Flavin, RN 07/24/2018 11:01 AM   .

## 2018-07-30 ENCOUNTER — Other Ambulatory Visit: Payer: Self-pay

## 2018-07-30 ENCOUNTER — Telehealth (INDEPENDENT_AMBULATORY_CARE_PROVIDER_SITE_OTHER): Payer: 59 | Admitting: Internal Medicine

## 2018-07-30 ENCOUNTER — Encounter: Payer: Self-pay | Admitting: Internal Medicine

## 2018-07-30 VITALS — BP 168/97 | HR 75 | Ht 68.0 in | Wt 300.6 lb

## 2018-07-30 DIAGNOSIS — I10 Essential (primary) hypertension: Secondary | ICD-10-CM

## 2018-07-30 DIAGNOSIS — E785 Hyperlipidemia, unspecified: Secondary | ICD-10-CM | POA: Diagnosis not present

## 2018-07-30 DIAGNOSIS — I5181 Takotsubo syndrome: Secondary | ICD-10-CM | POA: Diagnosis not present

## 2018-07-30 MED ORDER — METOPROLOL TARTRATE 25 MG PO TABS: 25.0000 mg | ORAL_TABLET | Freq: Two times a day (BID) | ORAL | 3 refills | Status: DC

## 2018-07-30 MED ORDER — AMLODIPINE BESYLATE 5 MG PO TABS: 5.0000 mg | ORAL_TABLET | Freq: Every day | ORAL | 3 refills | Status: DC

## 2018-07-30 NOTE — Progress Notes (Signed)
Virtual Visit via Video Note   This visit type was conducted due to national recommendations for restrictions regarding the COVID-19 Pandemic (e.g. social distancing) in an effort to limit this patient's exposure and mitigate transmission in our community.  Due to her co-morbid illnesses, this patient is at least at moderate risk for complications without adequate follow up.  This format is felt to be most appropriate for this patient at this time.  All issues noted in this document were discussed and addressed.  A limited physical exam was performed with this format.  Please refer to the patient's chart for her consent to telehealth for [facility].   Evaluation Performed:  Follow-up visit  Date:  07/30/2018   ID:  Diana Campos, DOB 1965/05/06, MRN [id number]  Patient Location: Home Provider Location: Home  PCP:  Patient, No Pcp Per  Cardiologist:  Dorris Carnes, MD  Electrophysiologist:  None   Chief Complaint:  F/U of HTN and hx Takotsubo's  History of Present Illness:    Diana Campos is a 53 y.o. female with with history of  Of CP   Admitted in November 2019    Troponin 2.81.    She underwent L heart cath in NOvember that showed normal coaornary arteries   Apical akinesis consistent with Takostusubo's CM   LVEF 35%%    Echo on 02/26/18 LVEF 55% with akinesis at apex of LV  Repeat echo on 11/26 with [facility], LVEF had completely normalized  PT also has a history of hyperlipidemia. And obesity    She was seen by Buren Kos in December    Having some chestpressure   ? GI  The patient does not have symptoms concerning for COVID-19 infection (fever, chills, cough, or new shortness of breath).    Past Medical History:  Diagnosis Date  . Acute heart failure ([place])   . NSTEMI (non-ST elevated myocardial infarction) ([place]) 02/26/2018   Past Surgical History:  Procedure Laterality Date  . ABDOMINAL HYSTERECTOMY    . BACK SURGERY    . CARDIAC CATHETERIZATION  02/17/2018  . LEFT  HEART CATH AND CORONARY ANGIOGRAPHY N/A 02/25/2018   Procedure: LEFT HEART CATH AND CORONARY ANGIOGRAPHY;  Surgeon: Belva Crome, MD;  Location: [place] [facility];  Service: Cardiovascular;  Laterality: N/A;  . TUBAL LIGATION    . ULTRASOUND GUIDANCE FOR VASCULAR ACCESS  02/25/2018   Procedure: Ultrasound Guidance For Vascular Access;  Surgeon: Belva Crome, MD;  Location: [place] [facility];  Service: Cardiovascular;;     Current Meds  Medication Sig  . amLODipine (NORVASC) 2.5 MG tablet Take 1 tablet by mouth once daily  . aspirin 81 MG chewable tablet Chew 1 tablet (81 mg total) by mouth daily.  Marland Kitchen atorvastatin (LIPITOR) 40 MG tablet Take 1 tablet (40 mg total) by mouth daily at 6 PM.  . furosemide (LASIX) 20 MG tablet Take one tablet by mouth twice a week (Tuesday and Friday)  . metoprolol tartrate (LOPRESSOR) 25 MG tablet Take 0.5 tablets (12.5 mg total) by mouth 2 (two) times daily.  . nitroGLYCERIN (NITROSTAT) 0.4 MG SL tablet Place 1 tablet (0.4 mg total) under the tongue every 5 (five) minutes as needed.  Marland Kitchen NUVESSA 1.3 % GEL INSERT 1 APPLICATORFUL VAGINALLY AT BEDTIME AS NEEDED     Allergies:   Patient has no known allergies.   Social History   Tobacco Use  . Smoking status: Never Smoker  . Smokeless tobacco: Never Used  Substance Use  Topics  . Alcohol use: Yes    Comment: occ  . Drug use: No     Family Hx: The patient's family history includes Cancer in her paternal grandmother; Hypertension in her mother.  ROS:   Please see the history of present illness.    All other systems reviewed and are negative.   Prior CV studies:   The following studies were reviewed today: Cath and echoes as noted above   Labs/Other Tests and Data Reviewed:    EKG:  Not done as tele visit Recent Labs: 03/03/2018: BUN 14; Creatinine, Ser 1.05; Hemoglobin 14.5; Platelets 381; Potassium 4.0; Sodium 140 03/16/2018: NT-Pro BNP 24 04/27/2018: ALT 12   Recent Lipid Panel Lab Results   Component Value Date/Time   CHOL 120 04/27/2018 09:07 AM   TRIG 61 04/27/2018 09:07 AM   HDL 34 (L) 04/27/2018 09:07 AM   CHOLHDL 3.5 04/27/2018 09:07 AM   CHOLHDL 5.9 02/25/2018 05:32 AM   LDLCALC 74 04/27/2018 09:07 AM    Wt Readings from Last 3 Encounters:  07/30/18 (!) 300 lb 9.6 oz (136.4 kg)  03/16/18 288 lb 6.4 oz (130.8 kg)  02/25/18 280 lb 3.3 oz (127.1 kg)     Objective:    Vital Signs:  BP (!) 168/97   Pulse 75   Ht 5\' 8"  (1.727 m)   Wt (!) 300 lb 9.6 oz (136.4 kg)   BMI 45.71 kg/m   Not done as televisit  ASSESSMENT & PLAN:    1. Hx Takotsubo's CM    Pt with event in Nov 2019   LVEF normalized quickly      2  HTN   Not optimally controlled   Will increase amlodipine to 5 mg daily and metoprolol to 25 mg bid Will f/u in late June , early July      3   HL   Keep on statin   LDL is good    4  Obesity   Encouraged increased activity, watch diet  5  COVID-19 Education: The signs and symptoms of COVID-19 were discussed with the patient and how to seek care for testing (follow up with PCP or arrange E-visit). The importance of social distancing was discussed today.  Encouraged her to stay active     Walk  Time:   Today, I have spent 25 minutes with the patient with telehealth technology discussing the above problems.     Medication Adjustments/Labs and Tests Ordered: Current medicines are reviewed at length with the patient today.  Concerns regarding medicines are outlined above.   Tests Ordered: No orders of the defined types were placed in this encounter.   Medication Changes: No orders of the defined types were placed in this encounter.   Disposition:  Follow up in late June, July Signed, Dorris Carnes, MD  07/30/2018 9:23 AM    [place]

## 2018-07-30 NOTE — Patient Instructions (Addendum)
Medication Instructions:  Your physician has recommended you make the following change in your medication:  1.) increase amlodipine to 5 mg once a day 2.) increase metoprolol tartrate to 25 mg twice a day (new prescriptions have been sent [employer].  If you need a refill on your cardiac medications before your next appointment, please call your pharmacy.   Lab work: none If you have labs (blood work) drawn today and your tests are completely normal, you will receive your results only by: Marland Kitchen MyChart Message (if you have MyChart) OR . A paper copy in the mail If you have any lab test that is abnormal or we need to change your treatment, we will call you to review the results.  Testing/Procedures: none  Follow-Up: Dr. Harrington Challenger would like to see you back in 2-3 months.  You have been scheduled for October 16, 2018 at 8:30 am for another VIDEO visit with Dr. Harrington Challenger.   Any Other Special Instructions Will Be Listed Below (If Applicable)

## 2018-07-30 NOTE — Addendum Note (Signed)
Addended by: Rodman Key on: 07/30/2018 11:29 AM   Modules accepted: Orders

## 2018-08-07 ENCOUNTER — Other Ambulatory Visit: Payer: Self-pay | Admitting: Obstetrics

## 2018-08-07 DIAGNOSIS — B9689 Other specified bacterial agents as the cause of diseases classified elsewhere: Secondary | ICD-10-CM

## 2018-08-07 DIAGNOSIS — N760 Acute vaginitis: Principal | ICD-10-CM

## 2018-10-16 ENCOUNTER — Telehealth: Payer: 59 | Admitting: Internal Medicine

## 2018-11-23 ENCOUNTER — Ambulatory Visit: Payer: 59 | Admitting: Internal Medicine

## 2018-11-24 ENCOUNTER — Other Ambulatory Visit: Payer: Self-pay | Admitting: Internal Medicine

## 2018-11-24 MED ORDER — FUROSEMIDE 20 MG PO TABS: ORAL_TABLET | ORAL | 0 refills | Status: DC

## 2018-12-16 ENCOUNTER — Ambulatory Visit (INDEPENDENT_AMBULATORY_CARE_PROVIDER_SITE_OTHER): Payer: 59 | Admitting: Physician Assistant

## 2018-12-16 ENCOUNTER — Encounter: Payer: Self-pay | Admitting: Physician Assistant

## 2018-12-16 ENCOUNTER — Other Ambulatory Visit: Payer: Self-pay

## 2018-12-16 VITALS — BP 120/70 | HR 62 | Ht 68.0 in | Wt 305.0 lb

## 2018-12-16 DIAGNOSIS — R072 Precordial pain: Secondary | ICD-10-CM

## 2018-12-16 DIAGNOSIS — I5181 Takotsubo syndrome: Secondary | ICD-10-CM

## 2018-12-16 DIAGNOSIS — E785 Hyperlipidemia, unspecified: Secondary | ICD-10-CM

## 2018-12-16 DIAGNOSIS — I10 Essential (primary) hypertension: Secondary | ICD-10-CM | POA: Diagnosis not present

## 2018-12-16 DIAGNOSIS — I5032 Chronic diastolic (congestive) heart failure: Secondary | ICD-10-CM

## 2018-12-16 MED ORDER — FUROSEMIDE 20 MG PO TABS: 20.0000 mg | ORAL_TABLET | Freq: Every day | ORAL | 1 refills | Status: DC

## 2018-12-16 MED ORDER — POTASSIUM CHLORIDE ER 10 MEQ PO TBCR: 10.0000 meq | EXTENDED_RELEASE_TABLET | Freq: Every day | ORAL | 1 refills | Status: DC

## 2018-12-16 NOTE — Progress Notes (Signed)
Cardiology Office Note:    Date:  12/16/2018   ID:  Diana Campos, DOB November 15, 1965, MRN [id number]  PCP:  Patient, No Pcp Per  Cardiologist:  Dorris Carnes, MD  Electrophysiologist:  None   Referring MD: No ref. provider found   Chief Complaint  Patient presents with  . Follow-up    Cardiomypoathy, HTN  . Shortness of Breath    History of Present Illness:    Diana Campos is a 53 y.o. female with:   Tako-Tsubo CM  S/p NSTEMI 02/2018  Cath w/ normal coronary arteries  EF 35 >> 55 by echocardiogram 02/2018  Limited echo 03/03/2018: EF 60-65, Gr 1 DD, normal wall motion  Hypertension  Hyperlipidemia   Anxiety   Obesity   Diana Campos suffered a non-ST elevation myocardial infarction November 2019.  EF was 35% with anteroapical akinesis at cardiac catheterization.  Diana Campos had normal coronary arteries consistent with stress cardiomyopathy.  Repeat echo 1 week later demonstrated normal LV function with normal wall motion.  Diana Campos was last evaluated by Dr. Harrington Challenger via telemedicine in April 2020.  Her medications were adjusted for uncontrolled blood pressure.  Diana Campos returns for follow-up.  Diana Campos is here alone.  Over the past couple weeks, Diana Campos has had 2 episodes of chest discomfort described as heaviness.  Both episodes resolved with nitroglycerin.  These occurred at rest.  Diana Campos did not have associated shortness of breath.  Diana Campos did have some radiation up into her neck.  It was similar to her previous angina.  Diana Campos has not had syncope, orthopnea.  Diana Campos has paroxysmal nocturnal dyspnea on at least 1 occasion.  Most troublesome for her is shortness of breath with mild to moderate activities.  Diana Campos also notes bilateral lower extremity swelling.  Diana Campos has taken Lasix 2 days in a row with some improvement in her swelling.  Prior CV studies:   The following studies were reviewed today:  Limited echocardiogram 03/03/2018 Mild focal basal septal hypertrophy, EF [id number], grade 1 diastolic dysfunction   Echocardiogram 02/26/2018 Severe hypokinesis to akinesis of the apex, EF 55, grade 1 diastolic dysfunction  Cardiac catheterization 02/25/2018  Right dominant coronary anatomy  Normal left main  Widely patent LAD that wraps around the left ventricular apex without significant obstruction.  Distal LAD caliber less than anticipated although no areas of angiographic obstruction, possibly due to dyskinesis of the anterior wall.  Cannot totally exclude the possibility of SCAD.  Normal circumflex  Normal ramus intermedius  Anteroapical akinesis/dyskinesis compatible with Takotsubo/stress cardiomyopathy  Acute systolic heart failure with EF 35% and LVEDP 25 mmHg.    Past Medical History:  Diagnosis Date  . Acute heart failure ([place])   . NSTEMI (non-ST elevated myocardial infarction) ([place]) 02/26/2018   Surgical Hx: The patient  has a past surgical history that includes Back surgery; Abdominal hysterectomy; Tubal ligation; LEFT HEART CATH AND CORONARY ANGIOGRAPHY (N/A, 02/25/2018); Ultrasound guidance for vascular access (02/25/2018); and Cardiac catheterization (02/17/2018).   Current Medications: Current Meds  Medication Sig  . amLODipine (NORVASC) 5 MG tablet Take 1 tablet (5 mg total) by mouth daily.  Marland Kitchen aspirin 81 MG chewable tablet Chew 1 tablet (81 mg total) by mouth daily.  Marland Kitchen atorvastatin (LIPITOR) 40 MG tablet Take 1 tablet (40 mg total) by mouth daily at 6 PM.  . furosemide (LASIX) 20 MG tablet Take 1 tablet (20 mg total) by mouth daily.  . metoprolol tartrate (LOPRESSOR) 25 MG tablet Take 1 tablet (25 mg total) by mouth 2 (  two) times daily.  . nitroGLYCERIN (NITROSTAT) 0.4 MG SL tablet Place 1 tablet (0.4 mg total) under the tongue every 5 (five) minutes as needed.  Marland Kitchen NUVESSA 1.3 % GEL INSERT 1 APPLICATORFUL VAGINALLY  AT BEDTIME AS NEEDED  . [DISCONTINUED] furosemide (LASIX) 20 MG tablet Take one tablet by mouth twice a week (Tuesday and Friday). Please keep upcoming appt in  September. Thanks     Allergies:   Patient has no known allergies.   Social History   Tobacco Use  . Smoking status: Never Smoker  . Smokeless tobacco: Never Used  Substance Use Topics  . Alcohol use: Yes    Comment: occ  . Drug use: No     Family Hx: The patient's family history includes Cancer in her paternal grandmother; Hypertension in her mother.  ROS:   Please see the history of present illness.    Review of Systems  Respiratory: Positive for wheezing. Negative for cough.   Gastrointestinal: Negative for hematochezia and melena.  Genitourinary: Negative for hematuria.   All other systems reviewed and are negative.   EKGs/Labs/Other Test Reviewed:    EKG:  EKG is  ordered today.  The ekg ordered today demonstrates normal sinus rhythm, heart rate 62, leftward axis, no ST-T wave changes, QTC 418.  Of note, since prior tracing dated 02/26/2018, anterolateral T wave inversions have resolved.  Recent Labs: 03/03/2018: BUN 14; Creatinine, Ser 1.05; Hemoglobin 14.5; Platelets 381; Potassium 4.0; Sodium 140 03/16/2018: NT-Pro BNP 24 04/27/2018: ALT 12   Recent Lipid Panel Lab Results  Component Value Date/Time   CHOL 120 04/27/2018 09:07 AM   TRIG 61 04/27/2018 09:07 AM   HDL 34 (L) 04/27/2018 09:07 AM   CHOLHDL 3.5 04/27/2018 09:07 AM   CHOLHDL 5.9 02/25/2018 05:32 AM   LDLCALC 74 04/27/2018 09:07 AM    Physical Exam:    VS:  BP 120/70   Pulse 62   Ht 5\' 8"  (1.727 m)   Wt (!) 305 lb (138.3 kg)   SpO2 96%   BMI 46.38 kg/m     Wt Readings from Last 3 Encounters:  12/16/18 (!) 305 lb (138.3 kg)  07/30/18 (!) 300 lb 9.6 oz (136.4 kg)  03/16/18 288 lb 6.4 oz (130.8 kg)     Physical Exam  Constitutional: Diana Campos is oriented to person, place, and time. Diana Campos appears well-developed and well-nourished. No distress.  HENT:  Head: Normocephalic and atraumatic.  Eyes: No scleral icterus.  Neck: JVD (JVP 6 cm) present. No thyromegaly present.  Cardiovascular: Normal  rate, regular rhythm and normal heart sounds.  No murmur heard. Pulmonary/Chest: Effort normal and breath sounds normal. Diana Campos has no wheezes. Diana Campos has no rales.  Abdominal: Soft. There is no hepatomegaly.  Musculoskeletal:        General: Edema (trace-1+ bilat ankle edema) present.  Lymphadenopathy:    Diana Campos has no cervical adenopathy.  Neurological: Diana Campos is alert and oriented to person, place, and time.  Skin: Skin is warm and dry.  Psychiatric: Diana Campos has a normal mood and affect.    ASSESSMENT & PLAN:    1. Chronic diastolic CHF (congestive heart failure) (HCC) Diana Campos has mild diastolic dysfunction on echocardiogram.  Diana Campos has some evidence of volume excess on exam.  I suspect her shortness of breath is related to diastolic heart failure.  Increase Lasix to 20 mg daily.  Add potassium 10 mEq daily.  Obtain BMET, BNP today.  Repeat BMP in 1 week.  Follow-up with me or Dr.  Ross in the next 2 to 4 weeks.  2. Precordial chest pain Diana Campos had 2 episodes of chest discomfort similar to her previous angina.  Diana Campos had no CAD on cardiac catheterization at time of her myocardial infarction.  Her ECG is now normal.  Question if Diana Campos has had coronary vasospasm contributing to her symptoms.  If her symptoms continue despite diuresis, consider changing amlodipine to isosorbide mononitrate.  3. Takotsubo cardiomyopathy Status post NSTEMI 11/19.  Ejection fraction has returned to normal.  Continue aspirin, statin, beta-blocker.  4. Essential hypertension The patient's blood pressure is controlled on her current regimen.  Continue current therapy.   5. Dyslipidemia LDL optimal on most recent lab work.  Continue current Rx.     Dispo:  Return in about 4 weeks (around 01/13/2019) for Routine Follow Up, w/ Dr. Harrington Challenger, or Richardson Dopp, PA-C.   Medication Adjustments/Labs and Tests Ordered: Current medicines are reviewed at length with the patient today.  Concerns regarding medicines are outlined above.  Tests Ordered:  Orders Placed This Encounter  Procedures  . Basic Metabolic Panel (BMET)  . Basic Metabolic Panel (BMET)  . Pro b natriuretic peptide  . EKG 12-Lead   Medication Changes: Meds ordered this encounter  Medications  . furosemide (LASIX) 20 MG tablet    Sig: Take 1 tablet (20 mg total) by mouth daily.    Dispense:  90 tablet    Refill:  1  . potassium chloride (K-DUR) 10 MEQ tablet    Sig: Take 1 tablet (10 mEq total) by mouth daily.    Dispense:  90 tablet    Refill:  1    Signed, Richardson Dopp, PA-C  12/16/2018 5:58 PM    [facility] [address], [place], [place]  [address] Phone: [phone]; Fax: [phone]

## 2018-12-16 NOTE — Patient Instructions (Addendum)
Medication Instructions:  Your physician has recommended you make the following change in your medication:  1.  Increase lasix one tablet (20 mg ) daily 2. Start Kdur one tablet (10 meq) daily.  All scripts sent in today #90 to requested pharmacy.   Labwork: Your physician recommends that you have lab work today: pro bnp/bmet Your physician recommends that you return for lab work bmet on Friday, September 18 between 8-5.    Testing/Procedures: -None  Follow-Up: Your physician recommends that you keep your scheduled  follow-up appointment with Richardson Dopp, PA-C on Friday, Oct 2 @ 8:15 am for a telehealth visit.  Please have bp reading, heart rate, weight and a list of medications ready prior to visit.   Any Other Special Instructions Will Be Listed Below (If Applicable).     If you need a refill on your cardiac medications before your next appointment, please call your pharmacy.

## 2018-12-17 LAB — BASIC METABOLIC PANEL
BUN/Creatinine Ratio: 17 (ref 9–23)
BUN: 14 mg/dL (ref 6–24)
CO2: 24 mmol/L (ref 20–29)
Calcium: 9.2 mg/dL (ref 8.7–10.2)
Chloride: 103 mmol/L (ref 96–106)
Creatinine, Ser: 0.82 mg/dL (ref 0.57–1.00)
GFR calc Af Amer: 96 mL/min/{1.73_m2} (ref >59)
GFR calc non Af Amer: 83 mL/min/{1.73_m2} (ref >59)
Glucose: 82 mg/dL (ref 65–99)
Potassium: 3.9 mmol/L (ref 3.5–5.2)
Sodium: 140 mmol/L (ref 134–144)

## 2018-12-17 LAB — PRO B NATRIURETIC PEPTIDE: NT-Pro BNP: 56 pg/mL (ref 0–249)

## 2018-12-25 ENCOUNTER — Other Ambulatory Visit: Payer: 59 | Admitting: *Deleted

## 2018-12-25 ENCOUNTER — Other Ambulatory Visit: Payer: Self-pay

## 2018-12-25 DIAGNOSIS — I10 Essential (primary) hypertension: Secondary | ICD-10-CM

## 2018-12-25 DIAGNOSIS — I5181 Takotsubo syndrome: Secondary | ICD-10-CM

## 2018-12-25 DIAGNOSIS — E785 Hyperlipidemia, unspecified: Secondary | ICD-10-CM

## 2018-12-25 LAB — BASIC METABOLIC PANEL
BUN/Creatinine Ratio: 15 (ref 9–23)
BUN: 14 mg/dL (ref 6–24)
CO2: 21 mmol/L (ref 20–29)
Calcium: 9.5 mg/dL (ref 8.7–10.2)
Chloride: 105 mmol/L (ref 96–106)
Creatinine, Ser: 0.92 mg/dL (ref 0.57–1.00)
GFR calc Af Amer: 83 mL/min/{1.73_m2} (ref >59)
GFR calc non Af Amer: 72 mL/min/{1.73_m2} (ref >59)
Glucose: 113 mg/dL — ABNORMAL HIGH (ref 65–99)
Potassium: 4.2 mmol/L (ref 3.5–5.2)
Sodium: 140 mmol/L (ref 134–144)

## 2019-01-07 NOTE — Progress Notes (Signed)
Virtual Visit via Video Note   This visit type was conducted due to national recommendations for restrictions regarding the COVID-19 Pandemic (e.g. social distancing) in an effort to limit this patient's exposure and mitigate transmission in our community.  Due to her co-morbid illnesses, this patient is at least at moderate risk for complications without adequate follow up.  This format is felt to be most appropriate for this patient at this time.  All issues noted in this document were discussed and addressed.  A limited physical exam was performed with this format.  Please refer to the patient's chart for her consent to telehealth for [facility].   Date:  01/08/2019   ID:  Diana Campos, DOB 12-22-1965, MRN [id number]  Patient Location: Home Provider Location: Office  PCP:  Patient, No Pcp Per  Cardiologist:  Dorris Carnes, MD   Electrophysiologist:  None   Evaluation Performed:  Follow-Up Visit  Chief Complaint:  CHF, HTN  History of Present Illness:    Diana Campos is a 53 y.o. female with:  [place]-Tsubo CM ? S/p NSTEMI 02/2018 ? Cath w/ normal coronary arteries ? EF 35 >> 55 by echocardiogram 02/2018 ? Limited echo 03/03/2018: EF 60-65, Gr 1 DD, normal wall motion  Hypertension  Hyperlipidemia   Anxiety   Obesity  Ms. Diana Campos suffered a non-ST elevation myocardial infarction November 2019.  EF was 35% with anteroapical akinesis at cardiac catheterization.  She had normal coronary arteries consistent with stress cardiomyopathy.  Repeat echo 1 week later demonstrated normal LV function with normal wall motion.  I saw her in clinic 12/16/2018.  She noted symptoms of chest pain, shortness of breath and swelling.   She appeared to be volume overloaded and I adjusted her Lasix.    Today, she notes she is doing well.  Her swelling did improve initially.  However, over the past 3 days, her ankles are more swollen.  She has not had any further chest pain.  She continues to have  shortness of breath with some activities.  She has not had syncope, orthopnea.   The patient does not have symptoms concerning for COVID-19 infection (fever, chills, cough, or new shortness of breath).    Past Medical History:  Diagnosis Date  . Acute heart failure ([place])   . NSTEMI (non-ST elevated myocardial infarction) ([place]) 02/26/2018   Past Surgical History:  Procedure Laterality Date  . ABDOMINAL HYSTERECTOMY    . BACK SURGERY    . CARDIAC CATHETERIZATION  02/17/2018  . LEFT HEART CATH AND CORONARY ANGIOGRAPHY N/A 02/25/2018   Procedure: LEFT HEART CATH AND CORONARY ANGIOGRAPHY;  Surgeon: Belva Crome, MD;  Location: [place] [facility];  Service: Cardiovascular;  Laterality: N/A;  . TUBAL LIGATION    . ULTRASOUND GUIDANCE FOR VASCULAR ACCESS  02/25/2018   Procedure: Ultrasound Guidance For Vascular Access;  Surgeon: Belva Crome, MD;  Location: [place] [facility];  Service: Cardiovascular;;     Current Meds  Medication Sig  . amLODipine (NORVASC) 5 MG tablet Take 1 tablet (5 mg total) by mouth daily.  Marland Kitchen aspirin 81 MG chewable tablet Chew 1 tablet (81 mg total) by mouth daily.  Marland Kitchen atorvastatin (LIPITOR) 40 MG tablet Take 1 tablet (40 mg total) by mouth daily at 6 PM.  . furosemide (LASIX) 20 MG tablet Take 1 tablet (20 mg total) by mouth daily.  . metoprolol tartrate (LOPRESSOR) 25 MG tablet Take 1 tablet (25 mg total) by mouth 2 (two) times  daily.  . nitroGLYCERIN (NITROSTAT) 0.4 MG SL tablet Place 1 tablet (0.4 mg total) under the tongue every 5 (five) minutes as needed.  Marland Kitchen NUVESSA 1.3 % GEL INSERT 1 APPLICATORFUL VAGINALLY  AT BEDTIME AS NEEDED  . potassium chloride (K-DUR) 10 MEQ tablet Take 1 tablet (10 mEq total) by mouth daily.     Allergies:   Patient has no known allergies.   Social History   Tobacco Use  . Smoking status: Never Smoker  . Smokeless tobacco: Never Used  Substance Use Topics  . Alcohol use: Yes    Comment: occ  . Drug use: No     Family  Hx: The patient's family history includes Cancer in her paternal grandmother; Hypertension in her mother.  ROS:   Please see the history of present illness.    She has not had any bleeding issues. All other systems reviewed and are negative.   Prior CV studies:   The following studies were reviewed today:   Limited echocardiogram 03/03/2018 Mild focal basal septal hypertrophy, EF [id number], grade 1 diastolic dysfunction  Echocardiogram 02/26/2018 Severe hypokinesis to akinesis of the apex, EF 55, grade 1 diastolic dysfunction  Cardiac catheterization 02/25/2018  Right dominant coronary anatomy  Normal left main  Widely patent LAD that wraps around the left ventricular apex without significant obstruction. Distal LAD caliber less than anticipated although no areas of angiographic obstruction, possibly due to dyskinesis of the anterior wall. Cannot totally exclude the possibility of SCAD.  Normal circumflex  Normal ramus intermedius  Anteroapical akinesis/dyskinesis compatible with Takotsubo/stress cardiomyopathy  Acute systolic heart failure with EF 35% and LVEDP 25 mmHg.   Labs/Other Tests and Data Reviewed:    EKG:  No ECG reviewed.  Recent Labs: 03/03/2018: Hemoglobin 14.5; Platelets 381 04/27/2018: ALT 12 12/16/2018: NT-Pro BNP 56 12/25/2018: BUN 14; Creatinine, Ser 0.92; Potassium 4.2; Sodium 140   Recent Lipid Panel Lab Results  Component Value Date/Time   CHOL 120 04/27/2018 09:07 AM   TRIG 61 04/27/2018 09:07 AM   HDL 34 (L) 04/27/2018 09:07 AM   CHOLHDL 3.5 04/27/2018 09:07 AM   CHOLHDL 5.9 02/25/2018 05:32 AM   LDLCALC 74 04/27/2018 09:07 AM    Wt Readings from Last 3 Encounters:  01/08/19 (!) 311 lb (141.1 kg)  12/16/18 (!) 305 lb (138.3 kg)  07/30/18 (!) 300 lb 9.6 oz (136.4 kg)     Objective:    Vital Signs:  BP (!) 156/85   Pulse 82   Ht 5\' 8"  (1.727 m)   Wt (!) 311 lb (141.1 kg)   BMI 47.29 kg/m    VITAL SIGNS:  reviewed GEN:  no  acute distress EYES:  sclerae anicteric, EOMI - Extraocular Movements Intact RESPIRATORY:  Normal respiratory effort NEURO:  alert and oriented x 3, no obvious focal deficit PSYCH:  normal affect  ASSESSMENT & PLAN:    1. Chronic diastolic CHF (congestive heart failure) (HCC) Overall her volume seems to be stable.  She does not some increased swelling recently.  This seems to improve with elevation.  Therefore, I suspect she has some venous insufficiency as well.  Her shortness of breath is likely multifactorial and related to diastolic dysfunction, obesity and deconditioning.  I have encouraged her to increase activity and work on weight loss.  She can take an extra Lasix and K+ on days she notes more swelling.    2. Precordial chest pain She has not had any further chest discomfort.  I suspect that this was  probably related to volume excess.  Continue to monitor.  3. Essential hypertension Blood pressure above target.  She has not taken medications yet today.  I have asked her to continue to monitor blood pressure over the next week and send those readings for review.  If her pressure remains above target, consider increasing amlodipine or adding an ACE inhibitor.  4. Takotsubo cardiomyopathy S/p NSTEMI in 11/19.  EF has returned to normal.  Continue aspirin, statin, beta-blocker.  5.  Obesity She plans to join a weight loss program to help with weight loss.  Time:   Today, I have spent 7 minutes with the patient with telehealth technology discussing the above problems.     Medication Adjustments/Labs and Tests Ordered: Current medicines are reviewed at length with the patient today.  Concerns regarding medicines are outlined above.   Tests Ordered: No orders of the defined types were placed in this encounter.   Medication Changes: No orders of the defined types were placed in this encounter.   Follow Up:  Virtual Visit or In Person in 3 month(s)  Signed, Richardson Dopp, PA-C   01/08/2019 8:49 AM    [place]

## 2019-01-08 ENCOUNTER — Other Ambulatory Visit: Payer: Self-pay

## 2019-01-08 ENCOUNTER — Telehealth (INDEPENDENT_AMBULATORY_CARE_PROVIDER_SITE_OTHER): Payer: 59 | Admitting: Physician Assistant

## 2019-01-08 VITALS — BP 156/85 | HR 82 | Ht 68.0 in | Wt 311.0 lb

## 2019-01-08 DIAGNOSIS — I5032 Chronic diastolic (congestive) heart failure: Secondary | ICD-10-CM

## 2019-01-08 DIAGNOSIS — I5181 Takotsubo syndrome: Secondary | ICD-10-CM

## 2019-01-08 DIAGNOSIS — R072 Precordial pain: Secondary | ICD-10-CM

## 2019-01-08 DIAGNOSIS — I10 Essential (primary) hypertension: Secondary | ICD-10-CM

## 2019-01-08 NOTE — Patient Instructions (Addendum)
Medication Instructions:  You may take and extra lasix and potassium for increased swelling   If you need a refill on your cardiac medications before your next appointment, please call your pharmacy.   Lab work: None   If you have labs (blood work) drawn today and your tests are completely normal, you will receive your results only by: Marland Kitchen MyChart Message (if you have MyChart) OR . A paper copy in the mail If you have any lab test that is abnormal or we need to change your treatment, we will call you to review the results.  Testing/Procedures: None   Follow-Up: You are scheduled to see Richardson Dopp PA-C on 04/13/2019 @ 8:15 AM  Any Other Special Instructions Will Be Listed Below (If Applicable).  Check your blood pressure every day for 1 week and send your readings in for review

## 2019-01-17 NOTE — Telephone Encounter (Signed)
Most BPs listed are above target (<130/80). PLAN:  1. Increase Amlodipine to 10 mg once daily  Richardson Dopp, PA-C    01/17/2019 10:37 AM

## 2019-01-18 MED ORDER — AMLODIPINE BESYLATE 10 MG PO TABS: 10.0000 mg | ORAL_TABLET | Freq: Every day | ORAL | 2 refills | Status: DC

## 2019-01-18 NOTE — Telephone Encounter (Signed)
S/w pt is agreeable to treatment plan.  Will increase amlodipine one tablet (10 mg ) daily.  Sent to requested pharmacy #[id number].  Medication list updated.

## 2019-02-28 ENCOUNTER — Other Ambulatory Visit: Payer: Self-pay | Admitting: Cardiology

## 2019-02-28 DIAGNOSIS — Z20822 Contact with and (suspected) exposure to covid-19: Secondary | ICD-10-CM

## 2019-03-01 LAB — NOVEL CORONAVIRUS, NAA: SARS-CoV-2, NAA: NOT DETECTED

## 2019-03-05 ENCOUNTER — Other Ambulatory Visit: Payer: Self-pay

## 2019-03-05 ENCOUNTER — Ambulatory Visit
Admission: EM | Admit: 2019-03-05 | Discharge: 2019-03-05 | Disposition: A | Discharge status: Home / Self Care | Payer: 59 | Attending: Emergency Medicine | Admitting: Emergency Medicine

## 2019-03-05 ENCOUNTER — Encounter: Payer: Self-pay | Admitting: Emergency Medicine

## 2019-03-05 DIAGNOSIS — B9689 Other specified bacterial agents as the cause of diseases classified elsewhere: Secondary | ICD-10-CM

## 2019-03-05 DIAGNOSIS — N760 Acute vaginitis: Secondary | ICD-10-CM | POA: Diagnosis not present

## 2019-03-05 MED ORDER — METRONIDAZOLE 1 % EX GEL: Freq: Every day | CUTANEOUS | 0 refills | Status: DC

## 2019-03-05 MED ORDER — FLUCONAZOLE 200 MG PO TABS: 200.0000 mg | ORAL_TABLET | Freq: Once | ORAL | 0 refills | Status: AC

## 2019-03-05 NOTE — Discharge Instructions (Addendum)
Significant as prescribed.  Use warm, hot soaks for additional pain relief. STD panel pending, will call you if anything is positive & treat as appropriate.

## 2019-03-05 NOTE — ED Provider Notes (Signed)
[name]    CSN: [id number] Arrival date & time: 03/05/19  [id number]      History   Chief Complaint Chief Complaint  Patient presents with  . Vaginal Discharge    HPI Diana Campos is a 53 y.o. female with history of BV presenting for STD testing, suspected yeast infection.  Patient currently on course of antibiotics which she started last Saturday for sinus infection: Has 1 tablet left.  Patient endorses history of yeast infections status post antibiotic use: Has had thick, white discharge, vulvar swelling, irritation/itching.  Patient does endorse some burning with urination, though denies frequency, urgency, frequent UTIs.  Patient is status post hysterectomy.  Past Medical History:  Diagnosis Date  . Acute heart failure ([place])   . NSTEMI (non-ST elevated myocardial infarction) ([place]) 02/26/2018    Patient Active Problem List   Diagnosis Date Noted  . Dyslipidemia 02/28/2018  . Takotsubo cardiomyopathy 02/28/2018  . NSTEMI (non-ST elevated myocardial infarction) ([place]) 02/25/2018  . Anxiety 12/16/2012  . BV (bacterial vaginosis) 12/16/2012  . Obesity 12/16/2012    Past Surgical History:  Procedure Laterality Date  . ABDOMINAL HYSTERECTOMY    . BACK SURGERY    . CARDIAC CATHETERIZATION  02/17/2018  . LEFT HEART CATH AND CORONARY ANGIOGRAPHY N/A 02/25/2018   Procedure: LEFT HEART CATH AND CORONARY ANGIOGRAPHY;  Surgeon: Belva Crome, MD;  Location: [place] [facility];  Service: Cardiovascular;  Laterality: N/A;  . TUBAL LIGATION    . ULTRASOUND GUIDANCE FOR VASCULAR ACCESS  02/25/2018   Procedure: Ultrasound Guidance For Vascular Access;  Surgeon: Belva Crome, MD;  Location: [place] [facility];  Service: Cardiovascular;;    OB History    Gravida  4   Para  3   Term      Preterm      AB  1   Living  3     SAB      TAB  1   Ectopic      Multiple      Live Births  3            Home Medications    Prior to Admission medications    Medication Sig Start Date End Date Taking? Authorizing Provider  amLODipine (NORVASC) 10 MG tablet Take 1 tablet (10 mg total) by mouth daily. 01/18/19   Richardson Dopp T, PA-C  aspirin 81 MG chewable tablet Chew 1 tablet (81 mg total) by mouth daily. 03/01/18   Kroeger, Lorelee Cover., PA-C  atorvastatin (LIPITOR) 40 MG tablet Take 1 tablet (40 mg total) by mouth daily at 6 PM. 02/28/18   Kroeger, Lorelee Cover., PA-C  fluconazole (DIFLUCAN) 200 MG tablet Take 1 tablet (200 mg total) by mouth once for 1 dose. May repeat in 72 hours if needed 03/05/19 03/05/19  Hall-Potvin, [place], PA-C  furosemide (LASIX) 20 MG tablet Take 1 tablet (20 mg total) by mouth daily. 12/16/18   Richardson Dopp T, PA-C  metoprolol tartrate (LOPRESSOR) 25 MG tablet Take 1 tablet (25 mg total) by mouth 2 (two) times daily. 07/30/18   Fay Records, MD  metroNIDAZOLE (METROGEL) 1 % gel Apply topically daily. 03/05/19   Hall-Potvin, [place], PA-C  nitroGLYCERIN (NITROSTAT) 0.4 MG SL tablet Place 1 tablet (0.4 mg total) under the tongue every 5 (five) minutes as needed. 02/28/18   Bhagat, Crista Luria, PA  potassium chloride (K-DUR) 10 MEQ tablet Take 1 tablet (10 mEq total) by mouth daily. 12/16/18   Liliane Shi,  PA-C    Family History Family History  Problem Relation Age of Onset  . Hypertension Mother   . Cancer Paternal Grandmother     Social History Social History   Tobacco Use  . Smoking status: Never Smoker  . Smokeless tobacco: Never Used  Substance Use Topics  . Alcohol use: Yes    Comment: occ  . Drug use: No     Allergies   Patient has no known allergies.   Review of Systems Review of Systems  Constitutional: Negative for fatigue and fever.  Respiratory: Negative for cough and shortness of breath.   Cardiovascular: Negative for chest pain and palpitations.  Gastrointestinal: Negative for constipation and diarrhea.  Genitourinary: Positive for vaginal discharge. Negative for flank pain, frequency,  hematuria, pelvic pain, urgency, vaginal bleeding and vaginal pain.     Physical Exam Triage Vital Signs ED Triage Vitals  Enc Vitals Group     BP      Pulse      Resp      Temp      Temp src      SpO2      Weight      Height      Head Circumference      Peak Flow      Pain Score      Pain Loc      Pain Edu?      Excl. in [place]?    No data found.  Updated Vital Signs BP 130/86 (BP Location: Left Arm)   Pulse 88   Temp 97.8 F (36.6 C) (Oral)   Resp 16   SpO2 96%   Visual Acuity Right Eye Distance:   Left Eye Distance:   Bilateral Distance:    Right Eye Near:   Left Eye Near:    Bilateral Near:     Physical Exam Constitutional:      General: She is not in acute distress. HENT:     Head: Normocephalic and atraumatic.  Eyes:     General: No scleral icterus.    Pupils: Pupils are equal, round, and reactive to light.  Cardiovascular:     Rate and Rhythm: Normal rate.  Pulmonary:     Effort: Pulmonary effort is normal.  Abdominal:     General: Bowel sounds are normal.     Palpations: Abdomen is soft.     Tenderness: There is no abdominal tenderness. There is no right CVA tenderness, left CVA tenderness or guarding.  Genitourinary:    Comments: Patient declined, self-swab performed Skin:    Coloration: Skin is not jaundiced or pale.  Neurological:     Mental Status: She is alert and oriented to person, place, and time.      UC Treatments / Results  Labs (all labs ordered are listed, but only abnormal results are displayed) Labs Reviewed  CERVICOVAGINAL ANCILLARY ONLY    EKG   Radiology No results found.  Procedures Procedures (including critical care time)  Medications Ordered in UC Medications - No data to display  Initial Impression / Assessment and Plan / UC Course  I have reviewed the triage vital signs and the nursing notes.  Pertinent labs & imaging results that were available during my care of the patient were reviewed by me and  considered in my medical decision making (see chart for details).     STD panel pending, urine pregnancy in office deferred.  Will treat for Diflucan, refill MetroGel for patient's chronic BV.  Provided  symptom management advice as outlined below.  Return precautions discussed, patient verbalized understanding and is agreeable to plan. Final Clinical Impressions(s) / UC Diagnoses   Final diagnoses:  Vaginitis and vulvovaginitis     Discharge Instructions     Significant as prescribed.  Use warm, hot soaks for additional pain relief. STD panel pending, will call you if anything is positive & treat as appropriate.    ED Prescriptions    Medication Sig Dispense Auth. Provider   fluconazole (DIFLUCAN) 200 MG tablet Take 1 tablet (200 mg total) by mouth once for 1 dose. May repeat in 72 hours if needed 2 tablet Hall-Potvin, [place], PA-C   metroNIDAZOLE (METROGEL) 1 % gel Apply topically daily. 45 g Hall-Potvin, [place], PA-C     PDMP not reviewed this encounter.   [place], [place], [place] 03/05/19 [id number]

## 2019-03-05 NOTE — ED Triage Notes (Addendum)
Pt presents to [facility] for assessment of discharge after having sex with a new partner Friday.  She also began antibiotics last Saturday for a sinus infection.  States she has a hx of getting yeast with antibiotics, but also concerned about the timing with a new partner.  Pt states she had some mid lower abdominal pain yesterday, but denies any at this time.

## 2019-03-08 ENCOUNTER — Telehealth (HOSPITAL_COMMUNITY): Payer: Self-pay | Admitting: Emergency Medicine

## 2019-03-08 LAB — CERVICOVAGINAL ANCILLARY ONLY
Chlamydia: NEGATIVE
Neisseria Gonorrhea: NEGATIVE
Trichomonas: NEGATIVE

## 2019-03-08 NOTE — Telephone Encounter (Signed)
Pt called requesting std results. Results reported as negative. Pt states she is no better after taking second diflucan pill and taking metrogel. Pt encouraged to follow up and be reseen. Verbalized understanding, all questions answered.

## 2019-03-18 ENCOUNTER — Other Ambulatory Visit: Payer: Self-pay

## 2019-03-18 DIAGNOSIS — N898 Other specified noninflammatory disorders of vagina: Secondary | ICD-10-CM

## 2019-03-18 MED ORDER — METRONIDAZOLE 0.75 % VA GEL: 1.0000 | Freq: Every day | VAGINAL | 1 refills | Status: DC

## 2019-03-18 NOTE — Progress Notes (Signed)
Pt called with symptoms of vaginal odor and discharge and requests a refill of Metrogel. I sent metrogel rx to pt's pharmacy per protocol. Pt made aware that if her symptoms do not improve we will need to see her in the office for evaluation. Pt voices understanding.

## 2019-04-09 HISTORY — PX: COLONOSCOPY: SHX174

## 2019-04-13 ENCOUNTER — Ambulatory Visit: Payer: 59 | Admitting: Physician Assistant

## 2019-05-06 ENCOUNTER — Other Ambulatory Visit: Payer: Self-pay | Admitting: Physician Assistant

## 2019-06-28 ENCOUNTER — Other Ambulatory Visit: Payer: Self-pay | Admitting: Medical

## 2019-08-10 ENCOUNTER — Ambulatory Visit: Payer: 59 | Admitting: Obstetrics

## 2019-08-18 ENCOUNTER — Encounter: Payer: Self-pay | Admitting: Obstetrics

## 2019-08-18 ENCOUNTER — Ambulatory Visit (INDEPENDENT_AMBULATORY_CARE_PROVIDER_SITE_OTHER): Payer: 59 | Admitting: Obstetrics

## 2019-08-18 ENCOUNTER — Other Ambulatory Visit: Payer: Self-pay

## 2019-08-18 ENCOUNTER — Other Ambulatory Visit (HOSPITAL_COMMUNITY)
Admission: RE | Admit: 2019-08-18 | Discharge: 2019-08-18 | Disposition: A | Discharge status: Home / Self Care | Payer: 59 | Source: Ambulatory Visit | Attending: Obstetrics | Admitting: Obstetrics

## 2019-08-18 VITALS — BP 122/85 | HR 70 | Ht 68.0 in | Wt 304.0 lb

## 2019-08-18 DIAGNOSIS — F419 Anxiety disorder, unspecified: Secondary | ICD-10-CM

## 2019-08-18 DIAGNOSIS — N898 Other specified noninflammatory disorders of vagina: Secondary | ICD-10-CM | POA: Diagnosis not present

## 2019-08-18 DIAGNOSIS — Z1211 Encounter for screening for malignant neoplasm of colon: Secondary | ICD-10-CM | POA: Diagnosis not present

## 2019-08-18 DIAGNOSIS — Z0000 Encounter for general adult medical examination without abnormal findings: Secondary | ICD-10-CM

## 2019-08-18 DIAGNOSIS — Z01419 Encounter for gynecological examination (general) (routine) without abnormal findings: Secondary | ICD-10-CM

## 2019-08-18 DIAGNOSIS — Z6841 Body Mass Index (BMI) 40.0 and over, adult: Secondary | ICD-10-CM

## 2019-08-18 DIAGNOSIS — Z01411 Encounter for gynecological examination (general) (routine) with abnormal findings: Secondary | ICD-10-CM | POA: Diagnosis not present

## 2019-08-18 DIAGNOSIS — B9689 Other specified bacterial agents as the cause of diseases classified elsewhere: Secondary | ICD-10-CM

## 2019-08-18 DIAGNOSIS — Z1231 Encounter for screening mammogram for malignant neoplasm of breast: Secondary | ICD-10-CM | POA: Diagnosis not present

## 2019-08-18 DIAGNOSIS — Z113 Encounter for screening for infections with a predominantly sexual mode of transmission: Secondary | ICD-10-CM

## 2019-08-18 DIAGNOSIS — N760 Acute vaginitis: Secondary | ICD-10-CM

## 2019-08-18 MED ORDER — DIAZEPAM 2 MG PO TABS: 2.0000 mg | ORAL_TABLET | Freq: Two times a day (BID) | ORAL | 2 refills | Status: DC | PRN

## 2019-08-18 MED ORDER — METRONIDAZOLE 0.75 % VA GEL: 1.0000 | Freq: Two times a day (BID) | VAGINAL | 5 refills | Status: DC

## 2019-08-18 NOTE — Progress Notes (Signed)
Patient presents for her Annual Exam today.  LMP:P.HYST  Last Pap: 12/07/2013  STD Screening: Desires Full Panel  Mammogram: 01/05/2018 Colonoscopy: N/A Due now    CC:  Vaginal discharge. Rash on right breast.

## 2019-08-18 NOTE — Progress Notes (Addendum)
Subjective:        Diana Campos is a 54 y.o. female here for a routine exam.  Current complaints: Pain with intercourse.  Insomnia due to anxiety over domestic problems.   Personal health questionnaire:  Is patient Ashkenazi Jewish, have a family history of breast and/or ovarian cancer: no Is there a family history of uterine cancer diagnosed at age < 57, gastrointestinal cancer, urinary tract cancer, family member who is a Field seismologist syndrome-associated carrier: no Is the patient overweight and hypertensive, family history of diabetes, personal history of gestational diabetes, preeclampsia or PCOS: no Is patient over 34, have PCOS,  family history of premature CHD under age 53, diabetes, smoke, have hypertension or peripheral artery disease:  no At any time, has a partner hit, kicked or otherwise hurt or frightened you?: no Over the past 2 weeks, have you felt down, depressed or hopeless?: no Over the past 2 weeks, have you felt little interest or pleasure in doing things?:no   Gynecologic History No LMP recorded. Patient has had a hysterectomy. Contraception: status post hysterectomy Last Pap: 2015. Results were: normal Last mammogram: 2019. Results were: normal  Obstetric History OB History  Gravida Para Term Preterm AB Living  4 3     1 3   SAB TAB Ectopic Multiple Live Births    1     3    # Outcome Date GA Lbr Len/2nd Weight Sex Delivery Anes PTL Lv  4 Para 12/27/88    M Vag-Spont   LIV  3 Para 09/19/86    M Vag-Spont   LIV  2 TAB 04/08/82          1 Para 02/06/82    F Vag-Spont   LIV    Past Medical History:  Diagnosis Date  . Acute heart failure ([place])   . NSTEMI (non-ST elevated myocardial infarction) ([place]) 02/26/2018    Past Surgical History:  Procedure Laterality Date  . ABDOMINAL HYSTERECTOMY    . BACK SURGERY    . CARDIAC CATHETERIZATION  02/17/2018  . LEFT HEART CATH AND CORONARY ANGIOGRAPHY N/A 02/25/2018   Procedure: LEFT HEART CATH AND CORONARY  ANGIOGRAPHY;  Surgeon: Belva Crome, MD;  Location: [facility];  Service: Cardiovascular;  Laterality: N/A;  . TUBAL LIGATION    . ULTRASOUND GUIDANCE FOR VASCULAR ACCESS  02/25/2018   Procedure: Ultrasound Guidance For Vascular Access;  Surgeon: Belva Crome, MD;  Location: [facility];  Service: Cardiovascular;;     Current Outpatient Medications:  .  amLODipine (NORVASC) 10 MG tablet, Take 1 tablet (10 mg total) by mouth daily., Disp: 90 tablet, Rfl: 2 .  aspirin 81 MG chewable tablet, Chew 1 tablet (81 mg total) by mouth daily., Disp: , Rfl:  .  atorvastatin (LIPITOR) 40 MG tablet, TAKE 1 TABLET BY MOUTH ONCE DAILY AT  6  PM, Disp: 90 tablet, Rfl: 1 .  furosemide (LASIX) 20 MG tablet, Take 1 tablet (20 mg total) by mouth daily., Disp: 90 tablet, Rfl: 1 .  metoprolol tartrate (LOPRESSOR) 25 MG tablet, Take 1 tablet (25 mg total) by mouth 2 (two) times daily., Disp: 180 tablet, Rfl: 3 .  potassium chloride (K-DUR) 10 MEQ tablet, Take 1 tablet (10 mEq total) by mouth daily., Disp: 90 tablet, Rfl: 1 .  diazepam (VALIUM) 2 MG tablet, Take 1 tablet (2 mg total) by mouth every 12 (twelve) hours as needed for anxiety., Disp: 30 tablet, Rfl: 2 .  metroNIDAZOLE (METROGEL  VAGINAL) 0.75 % vaginal gel, Place 1 Applicatorful vaginally 2 (two) times daily., Disp: 70 g, Rfl: 5 .  metroNIDAZOLE (METROGEL) 0.75 % vaginal gel, Place 1 Applicatorful vaginally at bedtime. Apply one applicatorful to vagina at bedtime for 5 days (Patient not taking: Reported on 08/18/2019), Disp: 70 g, Rfl: 1 .  metroNIDAZOLE (METROGEL) 1 % gel, Apply topically daily. (Patient not taking: Reported on 08/18/2019), Disp: 45 g, Rfl: 0 .  nitroGLYCERIN (NITROSTAT) 0.4 MG SL tablet, PLACE 1 TABLET (0.4 MG TOTAL) UNDER THE TONGUE EVERY 5 MINUTES AS NEEDED, Disp: 25 tablet, Rfl: 3 No Known Allergies  Social History   Tobacco Use  . Smoking status: Never Smoker  . Smokeless tobacco: Never Used  Substance Use Topics  .  Alcohol use: Yes    Comment: occ    Family History  Problem Relation Age of Onset  . Hypertension Mother   . Cancer Paternal Grandmother       Review of Systems  Constitutional: negative for fatigue and weight loss Respiratory: negative for cough and wheezing Cardiovascular: negative for chest pain, fatigue and palpitations Gastrointestinal: negative for abdominal pain and change in bowel habits Musculoskeletal:negative for myalgias Neurological: negative for gait problems and tremors Behavioral/Psych: positive for anxiety. negative for abusive relationship, depression Endocrine: negative for temperature intolerance    Genitourinary: positive for vaginal dryness Integument/breast: negative for breast lump, breast tenderness, nipple discharge and skin lesion(s)    Objective:       BP 122/85   Pulse 70   Ht 5\' 8"  (1.727 m)   Wt (!) 304 lb (137.9 kg)   BMI 46.22 kg/m  General:   alert  Skin:   no rash or abnormalities  Lungs:   clear to auscultation bilaterally  Heart:   regular rate and rhythm, S1, S2 normal, no murmur, click, rub or gallop  Breasts:   normal without suspicious masses, skin or nipple changes or axillary nodes  Abdomen:  normal findings: no organomegaly, soft, non-tender and no hernia  Pelvis:  External genitalia: normal general appearance Urinary system: urethral meatus normal and bladder without fullness, nontender Vaginal: normal without tenderness, induration or masses Cervix: absent Adnexa: normal bimanual exam Uterus: absent   Lab Review Urine pregnancy test Labs reviewed yes Radiologic studies reviewed yes  50% of 20 min visit spent on counseling and coordination of care.   Assessment:     1. Encounter for gynecological examination  2. Vaginal discharge Rx: - Cervicovaginal ancillary only  3. Screening for STD (sexually transmitted disease) Rx: - Hepatitis B surface antigen - Hepatitis C antibody - RPR - HIV Antibody (routine  testing w rflx)  4. Encounter for screening mammogram for malignant neoplasm of breast Rx: - MM Digital Screening; Future  5. Screening for colon cancer Rx: - Ambulatory referral to Gastroenterology  6. Anxiety Rx: - diazepam (VALIUM) 2 MG tablet; Take 1 tablet (2 mg total) by mouth every 12 (twelve) hours as needed for anxiety.  Dispense: 30 tablet; Refill: 2 - Ambulatory referral to [facility]  7. BV (bacterial vaginosis) Rx: - metroNIDAZOLE (METROGEL VAGINAL) 0.75 % vaginal gel; Place 1 Applicatorful vaginally 2 (two) times daily.  Dispense: 70 g; Refill: 5  8. Healthcare maintenance Rx: - Ambulatory referral to Internal Medicine  9. Class 3 severe obesity due to excess calories without serious comorbidity with body mass index (BMI) of 40.0 to 44.9 in adult [facility])     Plan:    Education reviewed: calcium supplements, depression evaluation,  low fat, low cholesterol diet, safe sex/STD prevention, self breast exams and weight bearing exercise. Mammogram ordered. Follow up in: 1 year. Referred to GI for screening colonoscopy   Meds ordered this encounter  Medications  . diazepam (VALIUM) 2 MG tablet    Sig: Take 1 tablet (2 mg total) by mouth every 12 (twelve) hours as needed for anxiety.    Dispense:  30 tablet    Refill:  2  . metroNIDAZOLE (METROGEL VAGINAL) 0.75 % vaginal gel    Sig: Place 1 Applicatorful vaginally 2 (two) times daily.    Dispense:  70 g    Refill:  5   Orders Placed This Encounter  Procedures  . MM Digital Screening    Standing Status:   Future    Standing Expiration Date:   10/17/2020    Order Specific Question:   Reason for Exam (SYMPTOM  OR DIAGNOSIS REQUIRED)    Answer:   Screening for colon cancer    Order Specific Question:   Is the patient pregnant?    Answer:   No    Order Specific Question:   Preferred imaging location?    Answer:   [facility]  . Hepatitis B surface antigen  . Hepatitis C antibody  . RPR  .  HIV Antibody (routine testing w rflx)  . Ambulatory referral to Gastroenterology    Referral Priority:   Routine    Referral Type:   Consultation    Referral Reason:   Specialty Services Required    Number of Visits Requested:   1  . Ambulatory referral to Internal Medicine    Referral Priority:   Routine    Referral Type:   Consultation    Referral Reason:   Specialty Services Required    Requested Specialty:   Internal Medicine    Number of Visits Requested:   1  . Ambulatory referral to [facility]    Referral Priority:   Routine    Referral Type:   Consultation    Referral Reason:   Specialty Services Required    Number of Visits Requested:   1     Shelly Bombard, MD 08/18/2019 2:55 PM

## 2019-08-19 ENCOUNTER — Encounter: Payer: Self-pay | Admitting: Gastroenterology

## 2019-08-19 LAB — CERVICOVAGINAL ANCILLARY ONLY
Bacterial Vaginitis (gardnerella): NEGATIVE
Candida Glabrata: NEGATIVE
Candida Vaginitis: NEGATIVE
Chlamydia: NEGATIVE
Comment: NEGATIVE
Comment: NEGATIVE
Comment: NEGATIVE
Comment: NEGATIVE
Comment: NEGATIVE
Comment: NORMAL
Neisseria Gonorrhea: NEGATIVE
Trichomonas: NEGATIVE

## 2019-08-19 LAB — RPR: RPR Ser Ql: NONREACTIVE

## 2019-08-19 LAB — HIV ANTIBODY (ROUTINE TESTING W REFLEX): HIV Screen 4th Generation wRfx: NONREACTIVE

## 2019-08-19 LAB — HEPATITIS B SURFACE ANTIGEN: Hepatitis B Surface Ag: NEGATIVE

## 2019-08-19 LAB — HEPATITIS C ANTIBODY: Hep C Virus Ab: <0.1 s/co ratio (ref 0.0–0.9)

## 2019-08-31 ENCOUNTER — Other Ambulatory Visit: Payer: Self-pay

## 2019-08-31 ENCOUNTER — Encounter: Payer: Self-pay | Admitting: Gastroenterology

## 2019-08-31 ENCOUNTER — Ambulatory Visit (AMBULATORY_SURGERY_CENTER): Payer: Self-pay | Admitting: *Deleted

## 2019-08-31 VITALS — Ht 68.0 in | Wt 305.0 lb

## 2019-08-31 DIAGNOSIS — Z1211 Encounter for screening for malignant neoplasm of colon: Secondary | ICD-10-CM

## 2019-08-31 MED ORDER — SUTAB 1479-225-188 MG PO TABS: 24.0000 | ORAL_TABLET | ORAL | 0 refills | Status: DC

## 2019-08-31 NOTE — Progress Notes (Signed)
08-09-2019 covid vaccines completed   No egg or soy allergy known to patient  No issues with past sedation with any surgeries  or procedures, no intubation problems  No diet pills per patient No home 02 use per patient  No blood thinners per patient  Pt denies issues with constipation  No A fib or A flutter  EMMI video sent to pt's e mail   Pt states she does not take her Potassium daily- she will uses on PRN and she will hold from today due to Sutabs and will call if its necessary to restart her K+  Due to the COVID-19 pandemic we are asking patients to follow these guidelines. Please only bring one care partner. Please be aware that your care partner may wait in the car in the parking lot or if they feel like they will be too hot to wait in the car, they may wait in the lobby on the 4th floor. All care partners are required to wear a mask the entire time (we do not have any that we can provide them), they need to practice social distancing, and we will do a Covid check for all patient's and care partners when you arrive. Also we will check their temperature and your temperature. If the care partner waits in their car they need to stay in the parking lot the entire time and we will call them on their cell phone when the patient is ready for discharge so they can bring the car to the front of the building. Also all patient's will need to wear a mask into building.

## 2019-09-13 ENCOUNTER — Other Ambulatory Visit: Payer: Self-pay

## 2019-09-13 ENCOUNTER — Encounter: Payer: Self-pay | Admitting: Gastroenterology

## 2019-09-13 ENCOUNTER — Ambulatory Visit (AMBULATORY_SURGERY_CENTER): Payer: 59 | Admitting: Gastroenterology

## 2019-09-13 VITALS — BP 136/99 | HR 69 | Temp 97.1°F | Resp 26 | Ht 68.0 in | Wt 305.0 lb

## 2019-09-13 DIAGNOSIS — D123 Benign neoplasm of transverse colon: Secondary | ICD-10-CM

## 2019-09-13 DIAGNOSIS — D122 Benign neoplasm of ascending colon: Secondary | ICD-10-CM

## 2019-09-13 DIAGNOSIS — Z1211 Encounter for screening for malignant neoplasm of colon: Secondary | ICD-10-CM

## 2019-09-13 MED ORDER — SODIUM CHLORIDE 0.9 % IV SOLN: 500.0000 mL | Freq: Once | INTRAVENOUS | Status: DC

## 2019-09-13 NOTE — Progress Notes (Signed)
Report given to PACU, vss 

## 2019-09-13 NOTE — Progress Notes (Signed)
Called to room to assist during endoscopic procedure.  Patient ID and intended procedure confirmed with present staff. Received instructions for my participation in the procedure from the performing physician.  

## 2019-09-13 NOTE — Progress Notes (Signed)
Pt's states no medical or surgical changes since previsit or office visit. 

## 2019-09-13 NOTE — Patient Instructions (Signed)
Handout provided on polyps, diverticulosis and hemorrhoids.  ° °YOU HAD AN ENDOSCOPIC PROCEDURE TODAY AT THE [facility]:   Refer to the procedure report that was given to you for any specific questions about what was found during the examination.  If the procedure report does not answer your questions, please call your gastroenterologist to clarify.  If you requested that your care partner not be given the details of your procedure findings, then the procedure report has been included in a sealed envelope for you to review at your convenience later. ° °YOU SHOULD EXPECT: Some feelings of bloating in the abdomen. Passage of more gas than usual.  Walking can help get rid of the air that was put into your GI tract during the procedure and reduce the bloating. If you had a lower endoscopy (such as a colonoscopy or flexible sigmoidoscopy) you may notice spotting of blood in your stool or on the toilet paper. If you underwent a bowel prep for your procedure, you may not have a normal bowel movement for a few days. ° °Please Note:  You might notice some irritation and congestion in your nose or some drainage.  This is from the oxygen used during your procedure.  There is no need for concern and it should clear up in a day or so. ° °SYMPTOMS TO REPORT IMMEDIATELY: ° °Following lower endoscopy (colonoscopy or flexible sigmoidoscopy): ° Excessive amounts of blood in the stool ° Significant tenderness or worsening of abdominal pains ° Swelling of the abdomen that is new, acute ° Fever of 100°F or higher ° °For urgent or emergent issues, a gastroenterologist can be reached at any hour by calling [phone]. °Do not use MyChart messaging for urgent concerns.  ° ° °DIET:  We do recommend a small meal at first, but then you may proceed to your regular diet.  Drink plenty of fluids but you should avoid alcoholic beverages for 24 hours. ° °ACTIVITY:  You should plan to take it easy for the rest of today and you  should NOT DRIVE or use heavy machinery until tomorrow (because of the sedation medicines used during the test).   ° °FOLLOW UP: °Our staff will call the number listed on your records 48-72 hours following your procedure to check on you and address any questions or concerns that you may have regarding the information given to you following your procedure. If we do not reach you, we will leave a message.  We will attempt to reach you two times.  During this call, we will ask if you have developed any symptoms of COVID 19. If you develop any symptoms (ie: fever, flu-like symptoms, shortness of breath, cough etc.) before then, please call [phone].  If you test positive for Covid 19 in the 2 weeks post procedure, please call and report this information to us.   ° °If any biopsies were taken you will be contacted by phone or by letter within the next 1-3 weeks.  Please call us at [phone] if you have not heard about the biopsies in 3 weeks.  ° ° °SIGNATURES/CONFIDENTIALITY: °You and/or your care partner have signed paperwork which will be entered into your electronic medical record.  These signatures attest to the fact that that the information above on your After Visit Summary has been reviewed and is understood.  Full responsibility of the confidentiality of this discharge information lies with you and/or your care-partner. ° °

## 2019-09-13 NOTE — Op Note (Signed)
[place] Patient Name: Diana Campos Procedure Date: 09/13/2019 1:34 PM MRN: [id number] Endoscopist: Mauri Pole , MD Age: 54 Referring MD:  Date of Birth: [birth year] Gender: Female Account #: [id number] Procedure:                Colonoscopy Indications:              Screening for colorectal malignant neoplasm Medicines:                Monitored Anesthesia Care Procedure:                Pre-Anesthesia Assessment:                           - Prior to the procedure, a History and Physical                            was performed, and patient medications and                            allergies were reviewed. The patient's tolerance of                            previous anesthesia was also reviewed. The risks                            and benefits of the procedure and the sedation                            options and risks were discussed with the patient.                            All questions were answered, and informed consent                            was obtained. Prior Anticoagulants: The patient has                            taken no previous anticoagulant or antiplatelet                            agents. ASA Grade Assessment: III - A patient with                            severe systemic disease. After reviewing the risks                            and benefits, the patient was deemed in                            satisfactory condition to undergo the procedure.                           After obtaining informed consent, the colonoscope  was passed under direct vision. Throughout the                            procedure, the patient's blood pressure, pulse, and                            oxygen saturations were monitored continuously. The                            Colonoscope was introduced through the anus and                            advanced to the the cecum, identified by                            appendiceal orifice  and ileocecal valve. The                            colonoscopy was performed without difficulty. The                            patient tolerated the procedure well. The quality                            of the bowel preparation was good. The ileocecal                            valve, appendiceal orifice, and rectum were                            photographed. Scope In: 1:40:27 PM Scope Out: 1:58:30 PM Scope Withdrawal Time: 0 hours 8 minutes 28 seconds  Total Procedure Duration: 0 hours 18 minutes 3 seconds  Findings:                 The perianal and digital rectal examinations were                            normal.                           Two sessile polyps were found in the transverse                            colon and ascending colon. The polyps were 5 to 7                            mm in size. These polyps were removed with a cold                            snare. Resection and retrieval were complete.                           Three hyperplastic appearing polyps were found in  the recto-sigmoid colon. The polyps were 2 to 5 mm                            in size. These polyps were removed with a cold                            snare. Resection was complete, but the polyp tissue                            was not retrieved completely.                           A few small-mouthed diverticula were found in the                            sigmoid colon, transverse colon and ascending colon.                           Non-bleeding internal hemorrhoids were found during                            retroflexion. The hemorrhoids were medium-sized. Complications:            No immediate complications. Estimated Blood Loss:     Estimated blood loss was minimal. Impression:               - Two 5 to 7 mm polyps in the transverse colon and                            in the ascending colon, removed with a cold snare.                            Resected and  retrieved.                           - Three 2 to 5 mm polyps at the recto-sigmoid                            colon, removed with a cold snare. Complete                            resection. Polyp tissue not retrieved.                           - Diverticulosis in the sigmoid colon, in the                            transverse colon and in the ascending colon.                           - Non-bleeding internal hemorrhoids. Recommendation:           - Patient has a contact number available for  emergencies. The signs and symptoms of potential                            delayed complications were discussed with the                            patient. Return to normal activities tomorrow.                            Written discharge instructions were provided to the                            patient.                           - Resume previous diet.                           - Continue present medications.                           - Await pathology results.                           - Repeat colonoscopy in 5-10 years for surveillance                            based on pathology results. Mauri Pole, MD 09/13/2019 2:09:54 PM This report has been signed electronically.

## 2019-09-15 ENCOUNTER — Telehealth: Payer: Self-pay | Admitting: *Deleted

## 2019-09-15 NOTE — Telephone Encounter (Signed)
  Follow up Call-  Call back number 09/13/2019  Post procedure Call Back phone  # [phone]  Permission to leave phone message Yes  Some recent data might be hidden     Patient questions:  Do you have a fever, pain , or abdominal swelling? No. Pain Score  0 *  Have you tolerated food without any problems? Yes.    Have you been able to return to your normal activities? Yes.    Do you have any questions about your discharge instructions: Diet   No. Medications  No. Follow up visit  No.  Do you have questions or concerns about your Care? No.  Actions: * If pain score is 4 or above: 1. No action needed, pain <4.Have you developed a fever since your procedure? no  2.   Have you had an respiratory symptoms (SOB or cough) since your procedure? no  3.   Have you tested positive for COVID 19 since your procedure no  4.   Have you had any family members/close contacts diagnosed with the COVID 19 since your procedure?  no   If yes to any of these questions please route to Joylene John, RN and Erenest Rasher, RN

## 2019-09-21 ENCOUNTER — Encounter: Payer: Self-pay | Admitting: Gastroenterology

## 2019-10-22 ENCOUNTER — Other Ambulatory Visit: Payer: Self-pay

## 2019-10-22 ENCOUNTER — Ambulatory Visit
Admission: RE | Admit: 2019-10-22 | Discharge: 2019-10-22 | Disposition: A | Discharge status: Home / Self Care | Payer: 59 | Source: Ambulatory Visit | Attending: Obstetrics | Admitting: Obstetrics

## 2019-10-22 DIAGNOSIS — Z1231 Encounter for screening mammogram for malignant neoplasm of breast: Secondary | ICD-10-CM

## 2019-12-06 ENCOUNTER — Other Ambulatory Visit: Payer: Self-pay | Admitting: Internal Medicine

## 2019-12-15 ENCOUNTER — Other Ambulatory Visit: Payer: Self-pay

## 2019-12-15 ENCOUNTER — Encounter: Payer: Self-pay | Admitting: Physician Assistant

## 2019-12-15 ENCOUNTER — Ambulatory Visit (INDEPENDENT_AMBULATORY_CARE_PROVIDER_SITE_OTHER): Payer: 59 | Admitting: Physician Assistant

## 2019-12-15 VITALS — BP 140/68 | HR 86 | Ht 68.0 in | Wt 300.0 lb

## 2019-12-15 DIAGNOSIS — I10 Essential (primary) hypertension: Secondary | ICD-10-CM

## 2019-12-15 DIAGNOSIS — R072 Precordial pain: Secondary | ICD-10-CM | POA: Diagnosis not present

## 2019-12-15 DIAGNOSIS — I5032 Chronic diastolic (congestive) heart failure: Secondary | ICD-10-CM

## 2019-12-15 DIAGNOSIS — I5181 Takotsubo syndrome: Secondary | ICD-10-CM | POA: Diagnosis not present

## 2019-12-15 DIAGNOSIS — R0602 Shortness of breath: Secondary | ICD-10-CM | POA: Diagnosis not present

## 2019-12-15 MED ORDER — POTASSIUM CHLORIDE ER 10 MEQ PO TBCR: 10.0000 meq | EXTENDED_RELEASE_TABLET | ORAL | 1 refills | Status: DC

## 2019-12-15 MED ORDER — FUROSEMIDE 20 MG PO TABS
20.0000 mg | ORAL_TABLET | ORAL | 1 refills | Status: DC
Start: 2019-12-15 — End: 2020-02-14

## 2019-12-15 MED ORDER — METOPROLOL TARTRATE 25 MG PO TABS
12.5000 mg | ORAL_TABLET | Freq: Two times a day (BID) | ORAL | 1 refills | Status: DC
Start: 2019-12-15 — End: 2020-01-04

## 2019-12-15 MED ORDER — ISOSORBIDE MONONITRATE ER 30 MG PO TB24: 30.0000 mg | ORAL_TABLET | Freq: Every day | ORAL | 1 refills | Status: DC

## 2019-12-15 NOTE — Patient Instructions (Signed)
Medication Instructions:  Your physician has recommended you make the following change in your medication:   1) Stop Amlodipine 2) Decrease Metoprolol to 12.5 mg, 0.5 tablet by mouth twice a day 3) Start Imdur 30 mg, 1 tablet by mouth once a day 4) Change Furosemide and Potassium to 1 tablet by mouth 3 times a week on Monday/Wednesday/Friday  *If you need a refill on your cardiac medications before your next appointment, please call your pharmacy*  Lab Work: You will have labs drawn today: BMET/BNP/CBC  If you have labs (blood work) drawn today and your tests are completely normal, you will receive your results only by: Marland Kitchen MyChart Message (if you have MyChart) OR . A paper copy in the mail If you have any lab test that is abnormal or we need to change your treatment, we will call you to review the results.  Testing/Procedures: Your physician has requested that you have a lexiscan myoview. For further information please visit [email or web address]. Please follow instruction sheet, as given.  Follow-Up: At [facility], you and your health needs are our priority.  As part of our continuing mission to provide you with exceptional heart care, we have created designated Provider Care Teams.  These Care Teams include your primary Cardiologist (physician) and Advanced Practice Providers (APPs -  Physician Assistants and Nurse Practitioners) who all work together to provide you with the care you need, when you need it.  Your next appointment:   3-4 week(s)  The format for your next appointment:   In Person  Provider:   Richardson Dopp, PA-C

## 2019-12-15 NOTE — Progress Notes (Signed)
Cardiology Office Note:    Date:  12/15/2019   ID:  Diana Campos, DOB Jan 21, 1966, MRN [id number]  PCP:  Patient, No Pcp Per  [place] Cardiologist:  Dorris Carnes, MD   [place] Electrophysiologist:  None   Referring MD: No ref. provider found   Chief Complaint:  Chest Pain and Shortness of Breath    Patient Profile:    Diana Campos is a 54 y.o. 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 Cath w/ normal coronary arteries ? EF 35 >> 55 by echocardiogram 02/2018 ? Limited echo March 10, 2018: EF 60-65, Gr 1 DD,normal wall motion  Diastolic CHF  Echocardiogram 11/19: EF 60-65, Gr 1 DD  Hypertension  Hyperlipidemia   Anxiety   Obesity  Prior CV studies:  Limited echocardiogram March 10, 2018 Mild focal basal septal hypertrophy, EF 07-86, grade 1 diastolic dysfunction  Echocardiogram 02/26/2018 Severe hypokinesis to akinesis of the apex, EF 55, grade 1 diastolic dysfunction  Cardiac catheterization 02/25/2018  Right dominant coronary anatomy  Normal left main  Widely patent LAD that wraps around the left ventricular apex without significant obstruction. Distal LAD caliber less than anticipated although no areas of angiographic obstruction, possibly due to dyskinesis of the anterior wall. Cannot totally exclude the possibility of SCAD.  Normal circumflex  Normal ramus intermedius  Anteroapical akinesis/dyskinesis compatible with Takotsubo/stress cardiomyopathy  Acute systolic heart failure with EF 35% and LVEDP 25 mmHg.  History of Present Illness:    Diana Campos was admitted in 11/19 with a non-STEMI.  Her EF was 35% with anteroapical akinesis.  She had normal coronary arteries on cardiac catheterization and findings were consistent with stress-induced cardiomyopathy.  Her LV function returned to normal by most recent echocardiogram in 11/19 with an EF of 60-65.  She was last seen via telemedicine in 01/2019.     She returns for further  evaluation of chest discomfort.  Over the past month, she has had substernal chest heaviness with associated nausea and diaphoresis.  This mainly occurs at night when she is at rest.  She takes nitroglycerin with relief.  She had 1 episode that required 3 nitroglycerin.  Her symptoms are reminiscent of her previous angina when she had a non-STEMI.  She has been trying to work out.  She continues to have issues with shortness of breath with minimal activity.  She has a stepper and can only do this exercise for 5 minutes.  She has not had orthopnea, paroxysmal nocturnal dyspnea, syncope.  She does have leg swelling.  She stopped taking her medications some months back.  She decided to start taking her medications every few days.  She noticed that her legs will swell after taking amlodipine.  She also noted some palpitations when she did not take metoprolol.  Past Medical History:  Diagnosis Date  . Acute heart failure ([place])   . Anemia    past hx before hysterectomy  . Anxiety   . Blood transfusion without reported diagnosis   . Hypertension   . NSTEMI (non-ST elevated myocardial infarction) ([place]) 02/26/2018  . Takotsubo cardiomyopathy     Current Medications: Current Meds  Medication Sig  . aspirin 81 MG chewable tablet Chew 1 tablet (81 mg total) by mouth daily.  Marland Kitchen atorvastatin (LIPITOR) 40 MG tablet TAKE 1 TABLET BY MOUTH ONCE DAILY AT  6  PM  . diazepam (VALIUM) 2 MG tablet Take 1 tablet (2 mg total) by mouth every 12 (twelve) hours as needed for anxiety.  Marland Kitchen  furosemide (LASIX) 20 MG tablet Take 1 tablet (20 mg total) by mouth 3 (three) times a week. Take on Monday/Wednesday/Friday  . metoprolol tartrate (LOPRESSOR) 25 MG tablet Take 0.5 tablets (12.5 mg total) by mouth 2 (two) times daily.  . nitroGLYCERIN (NITROSTAT) 0.4 MG SL tablet PLACE 1 TABLET (0.4 MG TOTAL) UNDER THE TONGUE EVERY 5 MINUTES AS NEEDED  . potassium chloride (KLOR-CON) 10 MEQ tablet Take 1 tablet (10 mEq total) by mouth 3  (three) times a week. Take on Monday/Wednesday/Friday  . [DISCONTINUED] amLODipine (NORVASC) 10 MG tablet Take 1 tablet (10 mg total) by mouth daily.  . [DISCONTINUED] furosemide (LASIX) 20 MG tablet Take 20 mg by mouth 3 (three) times a week. Take on Monday/Wednesday/Friday  . [DISCONTINUED] metoprolol tartrate (LOPRESSOR) 25 MG tablet Take 1 tablet (25 mg total) by mouth 2 (two) times daily. Please make yearly appt with Dr. Harrington Challenger for October for future refills. 1st attempt  . [DISCONTINUED] potassium chloride (KLOR-CON) 10 MEQ tablet Take 10 mEq by mouth 3 (three) times a week. Take on Monday/Wednesday/Friday     Allergies:   Patient has no known allergies.   Social History   Tobacco Use  . Smoking status: Never Smoker  . Smokeless tobacco: Never Used  Vaping Use  . Vaping Use: Never used  Substance Use Topics  . Alcohol use: Yes    Comment: occ  . Drug use: No     Family Hx: The patient's family history includes Cancer in her paternal grandmother; Hypertension in her mother. There is no history of Colon cancer, Colon polyps, Esophageal cancer, Rectal cancer, or Stomach cancer.  Review of Systems  Constitutional: Negative for chills and fever.  Respiratory: Negative for cough.   Gastrointestinal: Negative for hematochezia and melena.  Genitourinary: Negative for hematuria.  All other systems reviewed and are negative.    EKGs/Labs/Other Test Reviewed:    EKG:  EKG is   ordered today.  The ekg ordered today demonstrates normal sinus rhythm, heart rate 86, left axis deviation, nonspecific ST-T wave changes, QTC 428, no change from prior tracing  Recent Labs: 12/16/2018: NT-Pro BNP 56 12/25/2018: BUN 14; Creatinine, Ser 0.92; Potassium 4.2; Sodium 140   Recent Lipid Panel Lab Results  Component Value Date/Time   CHOL 120 04/27/2018 09:07 AM   TRIG 61 04/27/2018 09:07 AM   HDL 34 (L) 04/27/2018 09:07 AM   CHOLHDL 3.5 04/27/2018 09:07 AM   CHOLHDL 5.9 02/25/2018 05:32 AM    LDLCALC 74 04/27/2018 09:07 AM    Physical Exam:    VS:  BP 140/68   Pulse 86   Ht 5\' 8"  (1.727 m)   Wt 300 lb (136.1 kg)   SpO2 97%   BMI 45.61 kg/m     Wt Readings from Last 3 Encounters:  12/15/19 300 lb (136.1 kg)  09/13/19 (!) 305 lb (138.3 kg)  08/31/19 (!) 305 lb (138.3 kg)     Constitutional:      Appearance: Healthy appearance. Not in distress.  Neck:     Vascular: JVD normal.  Pulmonary:     Effort: Pulmonary effort is normal.     Breath sounds: No wheezing. No rales.  Cardiovascular:     Normal rate. Regular rhythm. Normal S1. Normal S2.     Murmurs: There is no murmur.  Edema:    Peripheral edema absent.  Abdominal:     Palpations: Abdomen is soft.  Skin:    General: Skin is warm and dry.  Neurological:  Mental Status: Alert and oriented to person, place and time.     Cranial Nerves: Cranial nerves are intact.      ASSESSMENT & PLAN:    1. Precordial pain She notes symptoms of angina over the past month.  This typically occurs at rest.  She takes nitroglycerin with relief.  She had normal coronary arteries at cardiac catheterization in November 2019.  She really has not had exertional chest symptoms.  She does have significant shortness of breath with exertion which has persisted since her non-STEMI.  Her ECG does not demonstrate any acute changes today.  Question if she may have coronary vasospasm versus microvascular ischemia.  Given the onset of her symptoms, I suspect spasm is a real possibility.  She is not tolerating amlodipine due to lower extremity swelling.  -Resume metoprolol tartrate at 12.5 mg twice daily  -DC amlodipine  -Start isosorbide mononitrate 30 mg daily  -Obtain Lexiscan Myoview  -Follow-up in 3 to 4 weeks  2. Chronic diastolic CHF (congestive heart failure) ([place]) 3. Shortness of breath She has a history of mild diastolic dysfunction on echocardiogram and has previously had somewhat improved symptoms on furosemide.  I will  obtain a BMET, CBC, BNP today.  I have asked her to resume furosemide and potassium every Monday, Wednesday, Friday.  If her BNP is significantly elevated, I will have her change her furosemide to daily dosing.  Question if her shortness of breath may be somewhat related to deconditioning.  I have asked her to scale back on her exercise and slowly build up.  If her cardiac work-up is unremarkable, it may be worthwhile referring her to pulmonology for evaluation of other causes for her dyspnea.  4. Takotsubo cardiomyopathy Status post non-STEMI in November 2019.  Her EF returned to normal.  If her EF is down on [facility], I will obtain a follow-up echocardiogram.  5. Essential hypertension Blood pressure somewhat elevated.  I will place her back on metoprolol and change her amlodipine to isosorbide as noted above.    Dispo:  Return in about 4 weeks (around 01/12/2020) for Follow up after testing, w/ Richardson Dopp, PA-C, in person.   Medication Adjustments/Labs and Tests Ordered: Current medicines are reviewed at length with the patient today.  Concerns regarding medicines are outlined above.  Tests Ordered: Orders Placed This Encounter  Procedures  . Basic metabolic panel  . CBC  . Pro b natriuretic peptide (BNP)  . MYOCARDIAL PERFUSION IMAGING  . EKG 12-Lead   Medication Changes: Meds ordered this encounter  Medications  . potassium chloride (KLOR-CON) 10 MEQ tablet    Sig: Take 1 tablet (10 mEq total) by mouth 3 (three) times a week. Take on Monday/Wednesday/Friday    Dispense:  45 tablet    Refill:  1  . furosemide (LASIX) 20 MG tablet    Sig: Take 1 tablet (20 mg total) by mouth 3 (three) times a week. Take on Monday/Wednesday/Friday    Dispense:  45 tablet    Refill:  1  . metoprolol tartrate (LOPRESSOR) 25 MG tablet    Sig: Take 0.5 tablets (12.5 mg total) by mouth 2 (two) times daily.    Dispense:  90 tablet    Refill:  1  . isosorbide mononitrate (IMDUR) 30 MG 24 hr  tablet    Sig: Take 1 tablet (30 mg total) by mouth daily.    Dispense:  90 tablet    Refill:  1    Signed, Richardson Dopp, PA-C  12/15/2019 2:40 PM    [facility] [address] [place], [place], [place]  [address] Phone: [phone]; Fax: [phone]

## 2019-12-16 LAB — CBC
Hematocrit: 45.1 % (ref 34.0–46.6)
Hemoglobin: 15.1 g/dL (ref 11.1–15.9)
MCH: 29.6 pg (ref 26.6–33.0)
MCHC: 33.5 g/dL (ref 31.5–35.7)
MCV: 88 fL (ref 79–97)
Platelets: 368 10*3/uL (ref 150–450)
RBC: 5.1 x10E6/uL (ref 3.77–5.28)
RDW: 13.1 % (ref 11.7–15.4)
WBC: 8 10*3/uL (ref 3.4–10.8)

## 2019-12-16 LAB — BASIC METABOLIC PANEL
BUN/Creatinine Ratio: 10 (ref 9–23)
BUN: 7 mg/dL (ref 6–24)
CO2: 24 mmol/L (ref 20–29)
Calcium: 9.3 mg/dL (ref 8.7–10.2)
Chloride: 105 mmol/L (ref 96–106)
Creatinine, Ser: 0.71 mg/dL (ref 0.57–1.00)
GFR calc Af Amer: 113 mL/min/{1.73_m2} (ref >59)
GFR calc non Af Amer: 98 mL/min/{1.73_m2} (ref >59)
Glucose: 90 mg/dL (ref 65–99)
Potassium: 3.8 mmol/L (ref 3.5–5.2)
Sodium: 143 mmol/L (ref 134–144)

## 2019-12-16 LAB — PRO B NATRIURETIC PEPTIDE: NT-Pro BNP: 35 pg/mL (ref 0–249)

## 2019-12-27 ENCOUNTER — Telehealth (HOSPITAL_COMMUNITY): Payer: Self-pay | Admitting: *Deleted

## 2019-12-27 NOTE — Telephone Encounter (Signed)
Patient given detailed instructions per Myocardial Perfusion Study Information Sheet for the test on 12/29/19. Patient notified to arrive 15 minutes early and that it is imperative to arrive on time for appointment to keep from having the test rescheduled.  If you need to cancel or reschedule your appointment, please call the office within 24 hours of your appointment. . Patient verbalized understanding. Kirstie Peri

## 2019-12-29 ENCOUNTER — Other Ambulatory Visit: Payer: Self-pay

## 2019-12-29 ENCOUNTER — Ambulatory Visit (HOSPITAL_COMMUNITY): Payer: 59 | Attending: Cardiovascular Disease

## 2019-12-29 DIAGNOSIS — R072 Precordial pain: Secondary | ICD-10-CM | POA: Diagnosis present

## 2019-12-29 MED ORDER — TECHNETIUM TC 99M TETROFOSMIN IV KIT
31.5000 | PACK | Freq: Once | INTRAVENOUS | Status: AC | PRN
  Administered 2019-12-29: 31.5 via INTRAVENOUS
  Filled 2019-12-29: qty 32

## 2019-12-29 MED ORDER — REGADENOSON 0.4 MG/5ML IV SOLN
0.4000 mg | Freq: Once | INTRAVENOUS | Status: AC
  Administered 2019-12-29: 0.4 mg via INTRAVENOUS

## 2019-12-30 ENCOUNTER — Ambulatory Visit (HOSPITAL_COMMUNITY): Payer: 59 | Attending: Internal Medicine

## 2019-12-30 LAB — MYOCARDIAL PERFUSION IMAGING
LV dias vol: 62 mL (ref 46–106)
LV sys vol: 20 mL
Peak HR: 90 {beats}/min
Rest HR: 66 {beats}/min
SDS: 2
SRS: 0
SSS: 2
TID: 0.76

## 2019-12-30 MED ORDER — TECHNETIUM TC 99M TETROFOSMIN IV KIT
31.4000 | PACK | Freq: Once | INTRAVENOUS | Status: AC | PRN
  Administered 2019-12-30: 31.4 via INTRAVENOUS
  Filled 2019-12-30: qty 32

## 2019-12-31 ENCOUNTER — Encounter: Payer: Self-pay | Admitting: Physician Assistant

## 2020-01-03 NOTE — Progress Notes (Signed)
Cardiology Office Note:    Date:  01/04/2020   ID:  Diana Campos, DOB June 10, 1965, MRN [id number]  PCP:  Patient, No Pcp Per  [place] Cardiologist:  Dorris Carnes, MD   [place] Electrophysiologist:  None   Referring MD: No ref. provider found   Chief Complaint:  Follow-up (Chest pain, shortness of breath)    Patient Profile:    Diana Campos is a 54 y.o. 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 Cath w/ normal coronary arteries ? EF 35 >> 55 by echocardiogram 02/2018 ? Limited echo 03/03/2018: EF 60-65, Gr 1 DD,normal wall motion  Diastolic CHF  Echocardiogram 11/19: EF 60-65, Gr 1 DD  Hypertension  Hyperlipidemia   Anxiety   Obesity  Prior CV studies: Myoview 12/30/19 EF 68, no ischemia, low risk  Limited echocardiogram 03/03/2018 Mild focal basal septal hypertrophy, EF 16-60, grade 1 diastolic dysfunction  Echocardiogram 02/26/2018 Severe hypokinesis to akinesis of the apex, EF 55, grade 1 diastolic dysfunction  Cardiac catheterization 02/25/2018  Right dominant coronary anatomy  Normal left main  Widely patent LAD that wraps around the left ventricular apex without significant obstruction. Distal LAD caliber less than anticipated although no areas of angiographic obstruction, possibly due to dyskinesis of the anterior wall. Cannot totally exclude the possibility of SCAD.  Normal circumflex  Normal ramus intermedius  Anteroapical akinesis/dyskinesis compatible with Takotsubo/stress cardiomyopathy  Acute systolic heart failure with EF 35% and LVEDP 25 mmHg.  History of Present Illness:    Diana Campos was admitted in 11/19 with a non-STEMI.  Her EF was 35% with anteroapical akinesis.  She had normal coronary arteries on cardiac catheterization and findings were consistent with stress-induced cardiomyopathy.  Her LV function returned to normal by most recent echocardiogram in 11/19 with an EF of 60-65.    She was last seen  12/15/19.  She noted symptoms of chest pain and shortness of breath.  I restarted her metoprolol and placed her on Isosorbide.  I stopped her Amlodipine as she had side effects of leg swelling.  BNP, Hgb were normal.  A Myoview was low risk without ischemia.   She returns for follow up.  Since taking isosorbide, she has not had further chest discomfort.  She still has shortness of breath with most activities.  She has not had orthopnea.  Her leg swelling is better controlled with taking furosemide 3 times a week.  She has not had syncope.  However, she has been tired since starting on isosorbide.  Blood pressure is somewhat low today and she has not taken her dose of isosorbide yet.   Past Medical History:  Diagnosis Date  . Acute heart failure ([place])   . Anemia    past hx before hysterectomy  . Anxiety   . Blood transfusion without reported diagnosis   . Chest pain    Myoview 9/21: EF 68, no ischemia; low risk  . Hypertension   . NSTEMI (non-ST elevated myocardial infarction) ([place]) 02/26/2018  . Takotsubo cardiomyopathy     Current Medications: Current Meds  Medication Sig  . aspirin 81 MG chewable tablet Chew 1 tablet (81 mg total) by mouth daily.  Marland Kitchen atorvastatin (LIPITOR) 40 MG tablet TAKE 1 TABLET BY MOUTH ONCE DAILY AT  6  PM  . diazepam (VALIUM) 2 MG tablet Take 1 tablet (2 mg total) by mouth every 12 (twelve) hours as needed for anxiety.  . furosemide (LASIX) 20 MG tablet Take 1 tablet (20  mg total) by mouth 3 (three) times a week. Take on Monday/Wednesday/Friday  . isosorbide mononitrate (IMDUR) 30 MG 24 hr tablet Take 0.5 tablets (15 mg total) by mouth daily.  . nitroGLYCERIN (NITROSTAT) 0.4 MG SL tablet PLACE 1 TABLET (0.4 MG TOTAL) UNDER THE TONGUE EVERY 5 MINUTES AS NEEDED  . potassium chloride (KLOR-CON) 10 MEQ tablet Take 1 tablet (10 mEq total) by mouth 3 (three) times a week. Take on Monday/Wednesday/Friday  . [DISCONTINUED] isosorbide mononitrate (IMDUR) 30 MG 24 hr tablet  Take 1 tablet (30 mg total) by mouth daily.  . [DISCONTINUED] metoprolol tartrate (LOPRESSOR) 25 MG tablet Take 0.5 tablets (12.5 mg total) by mouth 2 (two) times daily.     Allergies:   Patient has no known allergies.   Social History   Tobacco Use  . Smoking status: Never Smoker  . Smokeless tobacco: Never Used  Vaping Use  . Vaping Use: Never used  Substance Use Topics  . Alcohol use: Yes    Comment: occ  . Drug use: No     Family Hx: The patient's family history includes Cancer in her paternal grandmother; Hypertension in her mother. There is no history of Colon cancer, Colon polyps, Esophageal cancer, Rectal cancer, or Stomach cancer.  Review of Systems  Respiratory: Positive for snoring.      EKGs/Labs/Other Test Reviewed:    EKG:  EKG is not ordered today.  The ekg ordered today demonstrates n/a  Recent Labs: 12/15/2019: BUN 7; Creatinine, Ser 0.71; Hemoglobin 15.1; NT-Pro BNP 35; Platelets 368; Potassium 3.8; Sodium 143   Recent Lipid Panel Lab Results  Component Value Date/Time   CHOL 120 04/27/2018 09:07 AM   TRIG 61 04/27/2018 09:07 AM   HDL 34 (L) 04/27/2018 09:07 AM   CHOLHDL 3.5 04/27/2018 09:07 AM   CHOLHDL 5.9 02/25/2018 05:32 AM   LDLCALC 74 04/27/2018 09:07 AM    Physical Exam:    VS:  BP (!) 100/50   Pulse 76   Ht 5\' 8"  (1.727 m)   Wt (!) 303 lb (137.4 kg)   SpO2 97%   BMI 46.07 kg/m     Wt Readings from Last 3 Encounters:  01/04/20 (!) 303 lb (137.4 kg)  12/29/19 300 lb (136.1 kg)  12/15/19 300 lb (136.1 kg)     Constitutional:      Appearance: Healthy appearance. Not in distress.  Neck:     Vascular: JVD normal.  Pulmonary:     Effort: Pulmonary effort is normal.     Breath sounds: No wheezing. No rales.  Cardiovascular:     Normal rate. Regular rhythm. Normal S1. Normal S2.     Murmurs: There is no murmur.  Edema:    Peripheral edema absent.  Abdominal:     Palpations: Abdomen is soft.  Skin:    General: Skin is warm and  dry.  Neurological:     Mental Status: Alert and oriented to person, place and time.     Cranial Nerves: Cranial nerves are intact.      ASSESSMENT & PLAN:    1. SOB (shortness of breath) Her chest discomfort is improved with isosorbide.  I suspect she may have an element of microvascular ischemia contributing to her chest discomfort.  I would like to continue her on long-acting nitrates if possible.  She remains short of breath with most activities.  Her blood pressure is now low and she feels poorly with this.  She had some blurry vision related to her  low blood pressure.  She notes no issues with shortness of breath prior to her non-STEMI.  However, since then, she has been short of breath with most activities.  Question if her symptoms could be explained by beta-blocker therapy.  I think it is worth trying to get her off of this to see if it helps.  She will taper off of it over the next 3 days.  She has not taken isosorbide today.  I have asked her to hold off on resuming this until she is off of the metoprolol.  She will then resume isosorbide to 15 mg a day.  I have asked her to contact us if her symptoms do not improve.  If her symptoms do not improve, I will have her stop the atorvastatin for 2 weeks to see if this provides any relief.  If her symptoms continue despite this, I will refer her to pulmonology for further evaluation of her shortness of breath.  She may ultimately need cardiopulmonary exercise test.  2. Chronic diastolic CHF (congestive heart failure) (H[facility]) Overall, volume status stable.  Continue current dose of furosemide.  3. Essential hypertension Blood pressure now running somewhat low.  Adjust medications as outlined above.  4. Snoring She does note a prior history of snoring.  Adjust medications as outlined above and assess response.  Consider home sleep study at follow-up.   Dispo:  Return in about 4 months (around 05/05/2020) for Routine Follow Up, w/ Dr. Harrington Challenger, or  Richardson Dopp, PA-C, in person.   Medication Adjustments/Labs and Tests Ordered: Current medicines are reviewed at length with the patient today.  Concerns regarding medicines are outlined above.  Tests Ordered: No orders of the defined types were placed in this encounter.  Medication Changes: Meds ordered this encounter  Medications  . isosorbide mononitrate (IMDUR) 30 MG 24 hr tablet    Sig: Take 0.5 tablets (15 mg total) by mouth daily.    Dispense:  90 tablet    Refill:  1    Signed, Richardson Dopp, PA-C  01/04/2020 12:33 PM    [facility] [address], [place], [place]  [address] Phone: [phone]; Fax: [phone]

## 2020-01-04 ENCOUNTER — Other Ambulatory Visit: Payer: Self-pay

## 2020-01-04 ENCOUNTER — Ambulatory Visit (INDEPENDENT_AMBULATORY_CARE_PROVIDER_SITE_OTHER): Payer: 59 | Admitting: Physician Assistant

## 2020-01-04 ENCOUNTER — Encounter: Payer: Self-pay | Admitting: Physician Assistant

## 2020-01-04 VITALS — BP 100/50 | HR 76 | Ht 68.0 in | Wt 303.0 lb

## 2020-01-04 DIAGNOSIS — R0683 Snoring: Secondary | ICD-10-CM

## 2020-01-04 DIAGNOSIS — I5032 Chronic diastolic (congestive) heart failure: Secondary | ICD-10-CM | POA: Diagnosis not present

## 2020-01-04 DIAGNOSIS — I10 Essential (primary) hypertension: Secondary | ICD-10-CM

## 2020-01-04 DIAGNOSIS — R0602 Shortness of breath: Secondary | ICD-10-CM

## 2020-01-04 MED ORDER — ISOSORBIDE MONONITRATE ER 30 MG PO TB24: 15.0000 mg | ORAL_TABLET | Freq: Every day | ORAL | 1 refills | Status: DC

## 2020-01-04 NOTE — Patient Instructions (Signed)
Medication Instructions:  Your physician has recommended you make the following change in your medication:   1) Take Metoprolol once a day for 3 days, then stop medication  *If you need a refill on your cardiac medications before your next appointment, please call your pharmacy*  Lab Work: None ordered today  Testing/Procedures: None ordered today  Follow-Up: At [facility], you and your health needs are our priority.  As part of our continuing mission to provide you with exceptional heart care, we have created designated Provider Care Teams.  These Care Teams include your primary Cardiologist (physician) and Advanced Practice Providers (APPs -  Physician Assistants and Nurse Practitioners) who all work together to provide you with the care you need, when you need it.  Your next appointment:   4 month(s)  The format for your next appointment:   In Person  Provider:   You may see Dorris Carnes, MD or Richardson Dopp, PA-C  Other Instructions Resume Imdur on Friday at half the dose 15 mg, once a day. Check blood pressure and call if greater than 130/80 or less than 673 systolic (top number). Call if your symptoms have not improved with medication changes.

## 2020-02-14 DIAGNOSIS — I10 Essential (primary) hypertension: Secondary | ICD-10-CM

## 2020-02-14 MED ORDER — SPIRONOLACTONE 25 MG PO TABS: 25.0000 mg | ORAL_TABLET | Freq: Every day | ORAL | 11 refills | Status: DC

## 2020-02-14 MED ORDER — FUROSEMIDE 20 MG PO TABS: 20.0000 mg | ORAL_TABLET | Freq: Every day | ORAL | 3 refills | Status: DC | PRN

## 2020-02-14 NOTE — Telephone Encounter (Signed)
BP above goal.  Will try Spironolactone to see if this helps BP better.  PLAN:  1. DC K+ 2. Change Lasix to once daily prn  3. Start Spironolactone 25 mg once daily  4. BMET q week x 2. Richardson Dopp, PA-C    02/14/2020 8:53 AM

## 2020-02-22 ENCOUNTER — Other Ambulatory Visit: Payer: 59

## 2020-02-22 ENCOUNTER — Other Ambulatory Visit: Payer: Self-pay

## 2020-02-22 DIAGNOSIS — I10 Essential (primary) hypertension: Secondary | ICD-10-CM

## 2020-02-22 LAB — BASIC METABOLIC PANEL
BUN/Creatinine Ratio: 14 (ref 9–23)
BUN: 13 mg/dL (ref 6–24)
CO2: 26 mmol/L (ref 20–29)
Calcium: 9.4 mg/dL (ref 8.7–10.2)
Chloride: 102 mmol/L (ref 96–106)
Creatinine, Ser: 0.95 mg/dL (ref 0.57–1.00)
GFR calc Af Amer: 80 mL/min/{1.73_m2} (ref >59)
GFR calc non Af Amer: 69 mL/min/{1.73_m2} (ref >59)
Glucose: 141 mg/dL — ABNORMAL HIGH (ref 65–99)
Potassium: 4.1 mmol/L (ref 3.5–5.2)
Sodium: 140 mmol/L (ref 134–144)

## 2020-02-24 MED ORDER — DILTIAZEM HCL ER COATED BEADS 120 MG PO CP24: 120.0000 mg | ORAL_CAPSULE | Freq: Every day | ORAL | 3 refills | Status: DC

## 2020-02-24 NOTE — Telephone Encounter (Signed)
Her blood pressures are way too high. She had difficulty tolerating amlodipine in the past secondary to lower extremity swelling. Calcium channel blockers can be very effective in lowering blood pressure.  Diltiazem is a different type of calcium channel blocker and may not cause leg swelling like the amlodipine. PLAN:  If she is willing to try this, start diltiazem CD 120 mg daily. Continue current dose of spironolactone. Continue to maintain a low-sodium diet (<2 g/day). Richardson Dopp, PA-C 02/24/2020 9:06 AM

## 2020-02-29 ENCOUNTER — Other Ambulatory Visit (INDEPENDENT_AMBULATORY_CARE_PROVIDER_SITE_OTHER): Payer: 59 | Admitting: *Deleted

## 2020-02-29 ENCOUNTER — Other Ambulatory Visit: Payer: Self-pay

## 2020-02-29 DIAGNOSIS — I10 Essential (primary) hypertension: Secondary | ICD-10-CM

## 2020-02-29 LAB — BASIC METABOLIC PANEL WITH GFR
BUN/Creatinine Ratio: 12 (ref 9–23)
BUN: 12 mg/dL (ref 6–24)
CO2: 25 mmol/L (ref 20–29)
Calcium: 9.8 mg/dL (ref 8.7–10.2)
Chloride: 101 mmol/L (ref 96–106)
Creatinine, Ser: 0.99 mg/dL (ref 0.57–1.00)
GFR calc Af Amer: 76 mL/min/{1.73_m2}
GFR calc non Af Amer: 66 mL/min/{1.73_m2}
Glucose: 124 mg/dL — ABNORMAL HIGH (ref 65–99)
Potassium: 4.6 mmol/L (ref 3.5–5.2)
Sodium: 137 mmol/L (ref 134–144)

## 2020-04-19 ENCOUNTER — Other Ambulatory Visit: Payer: Self-pay | Admitting: Physician Assistant

## 2020-04-19 MED ORDER — FUROSEMIDE 20 MG PO TABS: 20.0000 mg | ORAL_TABLET | Freq: Every day | ORAL | 2 refills | Status: DC | PRN

## 2020-05-25 ENCOUNTER — Ambulatory Visit
Admission: EM | Admit: 2020-05-25 | Discharge: 2020-05-25 | Disposition: A | Discharge status: Home / Self Care | Payer: 59 | Attending: Family Medicine | Admitting: Family Medicine

## 2020-05-25 ENCOUNTER — Other Ambulatory Visit: Payer: Self-pay

## 2020-05-25 DIAGNOSIS — N898 Other specified noninflammatory disorders of vagina: Secondary | ICD-10-CM | POA: Insufficient documentation

## 2020-05-25 LAB — POCT URINALYSIS DIP (MANUAL ENTRY)
Bilirubin, UA: NEGATIVE
Glucose, UA: NEGATIVE mg/dL
Ketones, POC UA: NEGATIVE mg/dL
Leukocytes, UA: NEGATIVE
Nitrite, UA: NEGATIVE
Protein Ur, POC: NEGATIVE mg/dL
Spec Grav, UA: 1.025 (ref 1.010–1.025)
Urobilinogen, UA: 1 E.U./dL
pH, UA: 6.5 (ref 5.0–8.0)

## 2020-05-25 LAB — POCT URINE PREGNANCY: Preg Test, Ur: NEGATIVE

## 2020-05-25 MED ORDER — CEFTRIAXONE SODIUM 500 MG IJ SOLR: 500.0000 mg | Freq: Once | INTRAMUSCULAR | Status: DC

## 2020-05-25 MED ORDER — FLUCONAZOLE 150 MG PO TABS: ORAL_TABLET | ORAL | 0 refills | Status: DC

## 2020-05-25 MED ORDER — DOXYCYCLINE HYCLATE 100 MG PO CAPS: 100.0000 mg | ORAL_CAPSULE | Freq: Two times a day (BID) | ORAL | 0 refills | Status: DC

## 2020-05-25 NOTE — ED Provider Notes (Signed)
[place]   [id number] 05/25/20 Arrival Time: 3016  ASSESSMENT & PLAN:  1. Vaginal discharge       Discharge Instructions     You have been given the following today for treatment of suspected gonorrhea and/or chlamydia:  cefTRIAXone (ROCEPHIN) injection 500 mg  Please pick up your prescription for doxycycline 100 mg and begin taking twice daily for the next seven (7) days.  Even though we have treated you today, we have sent testing for sexually transmitted infections. We will notify you of any positive results once they are received. If required, we will prescribe any medications you might need.  Please refrain from all sexual activity for at least the next seven days.     Without s/s of PID.  Labs Reviewed  POCT URINALYSIS DIP (MANUAL ENTRY) - Abnormal; Notable for the following components:      Result Value   Blood, UA trace-intact (*)    All other components within normal limits  POCT URINE PREGNANCY  CERVICOVAGINAL ANCILLARY ONLY   Diflucan sent at pt request. Will notify of any positive results. Instructed to refrain from sexual activity for at least seven days.  Reviewed expectations re: course of current medical issues. Questions answered. Outlined signs and symptoms indicating need for more acute intervention. Patient verbalized understanding. After Visit Summary given.   SUBJECTIVE:  Diana Campos is a 55 y.o. female who presents with complaint of vaginal discharge. Onset gradual. First noticed about a month ago after unprotected sex with new female partner. Describes discharge as thick and white; without odor. No specific aggravating or alleviating factors reported. Denies: urinary frequency, dysuria and gross hematuria. Afebrile. No abdominal or pelvic pain. Normal PO intake wihout n/v. No genital rashes or lesions.  No LMP recorded. Patient has had a hysterectomy.   OBJECTIVE:  Vitals:   05/25/20 1045  BP: 138/85  Pulse: 77  Resp: 18   Temp: 98.2 F (36.8 C)  SpO2: 97%     General appearance: alert, cooperative, appears stated age and no distress Lungs: unlabored respirations; speaks full sentences without difficulty Back: no CVA tenderness; FROM at waist Abdomen: soft, non-tender GU: deferred Skin: warm and dry Psychological: alert and cooperative; normal mood and affect.  Results for orders placed or performed during the hospital encounter of 05/25/20  POCT urinalysis dipstick  Result Value Ref Range   Color, UA yellow yellow   Clarity, UA clear clear   Glucose, UA negative negative mg/dL   Bilirubin, UA negative negative   Ketones, POC UA negative negative mg/dL   Spec Grav, UA 1.025 1.010 - 1.025   Blood, UA trace-intact (A) negative   pH, UA 6.5 5.0 - 8.0   Protein Ur, POC negative negative mg/dL   Urobilinogen, UA 1.0 0.2 or 1.0 E.U./dL   Nitrite, UA Negative Negative   Leukocytes, UA Negative Negative  POCT urine pregnancy  Result Value Ref Range   Preg Test, Ur Negative Negative     No Known Allergies  Past Medical History:  Diagnosis Date  . Acute heart failure ([place])   . Anemia    past hx before hysterectomy  . Anxiety   . Blood transfusion without reported diagnosis   . Chest pain    Myoview 9/21: EF 68, no ischemia; low risk  . Hypertension   . NSTEMI (non-ST elevated myocardial infarction) ([place]) 02/26/2018  . Takotsubo cardiomyopathy    Family History  Problem Relation Age of Onset  . Hypertension Mother   .  Cancer Paternal Grandmother   . Colon cancer Neg Hx   . Colon polyps Neg Hx   . Esophageal cancer Neg Hx   . Rectal cancer Neg Hx   . Stomach cancer Neg Hx    Social History   Socioeconomic History  . Marital status: Divorced    Spouse name: Not on file  . Number of children: Not on file  . Years of education: Not on file  . Highest education level: Not on file  Occupational History  . Not on file  Tobacco Use  . Smoking status: Never Smoker  . Smokeless  tobacco: Never Used  Vaping Use  . Vaping Use: Never used  Substance and Sexual Activity  . Alcohol use: Yes    Comment: occ  . Drug use: No  . Sexual activity: Yes    Partners: Male  Other Topics Concern  . Not on file  Social History Narrative  . Not on file   Social Determinants of Health   Financial Resource Strain: Not on file  Food Insecurity: Not on file  Transportation Needs: Not on file  Physical Activity: Not on file  Stress: Not on file  Social Connections: Not on file  Intimate Partner Violence: Not on file          Vanessa Kick, MD 05/25/20 1113

## 2020-05-25 NOTE — Discharge Instructions (Signed)

## 2020-05-25 NOTE — ED Triage Notes (Signed)
Patient states she had unprotected sex on around new years and had a foul odor that went away with otc meds. Pt now has a significantly heavy discharge, mainly at night. Pt is aox4 and Ambulatory.

## 2020-05-30 LAB — CERVICOVAGINAL ANCILLARY ONLY
Bacterial Vaginitis (gardnerella): NEGATIVE
Candida Glabrata: NEGATIVE
Candida Vaginitis: NEGATIVE
Chlamydia: NEGATIVE
Comment: NEGATIVE
Comment: NEGATIVE
Comment: NEGATIVE
Comment: NEGATIVE
Comment: NEGATIVE
Comment: NORMAL
Neisseria Gonorrhea: NEGATIVE
Trichomonas: NEGATIVE

## 2020-06-14 ENCOUNTER — Ambulatory Visit (INDEPENDENT_AMBULATORY_CARE_PROVIDER_SITE_OTHER): Payer: 59 | Admitting: Obstetrics

## 2020-06-14 ENCOUNTER — Other Ambulatory Visit (HOSPITAL_COMMUNITY)
Admission: RE | Admit: 2020-06-14 | Discharge: 2020-06-14 | Disposition: A | Discharge status: Home / Self Care | Payer: 59 | Source: Ambulatory Visit | Attending: Obstetrics | Admitting: Obstetrics

## 2020-06-14 ENCOUNTER — Other Ambulatory Visit: Payer: Self-pay

## 2020-06-14 ENCOUNTER — Encounter: Payer: Self-pay | Admitting: Obstetrics

## 2020-06-14 ENCOUNTER — Telehealth: Payer: Self-pay

## 2020-06-14 VITALS — BP 143/90 | HR 74 | Ht 68.0 in | Wt 317.0 lb

## 2020-06-14 DIAGNOSIS — B373 Candidiasis of vulva and vagina: Secondary | ICD-10-CM

## 2020-06-14 DIAGNOSIS — N898 Other specified noninflammatory disorders of vagina: Secondary | ICD-10-CM | POA: Diagnosis not present

## 2020-06-14 DIAGNOSIS — B3731 Acute candidiasis of vulva and vagina: Secondary | ICD-10-CM

## 2020-06-14 DIAGNOSIS — N760 Acute vaginitis: Secondary | ICD-10-CM

## 2020-06-14 DIAGNOSIS — B9689 Other specified bacterial agents as the cause of diseases classified elsewhere: Secondary | ICD-10-CM

## 2020-06-14 DIAGNOSIS — R350 Frequency of micturition: Secondary | ICD-10-CM | POA: Diagnosis not present

## 2020-06-14 DIAGNOSIS — Z6841 Body Mass Index (BMI) 40.0 and over, adult: Secondary | ICD-10-CM

## 2020-06-14 DIAGNOSIS — E66813 Obesity, class 3: Secondary | ICD-10-CM

## 2020-06-14 LAB — POCT URINALYSIS DIPSTICK
Bilirubin, UA: NEGATIVE
Blood, UA: NEGATIVE
Glucose, UA: NEGATIVE
Leukocytes, UA: NEGATIVE
Nitrite, UA: NEGATIVE
Protein, UA: NEGATIVE
Spec Grav, UA: 1.02 (ref 1.010–1.025)
Urobilinogen, UA: 0.2 E.U./dL
pH, UA: 6 (ref 5.0–8.0)

## 2020-06-14 MED ORDER — FLUCONAZOLE 200 MG PO TABS: 200.0000 mg | ORAL_TABLET | ORAL | 2 refills | Status: DC

## 2020-06-14 MED ORDER — PHENTERMINE HCL 37.5 MG PO CAPS: 37.5000 mg | ORAL_CAPSULE | ORAL | 2 refills | Status: DC

## 2020-06-14 MED ORDER — METRONIDAZOLE 0.75 % VA GEL: 1.0000 | Freq: Two times a day (BID) | VAGINAL | 5 refills | Status: DC

## 2020-06-14 NOTE — Progress Notes (Signed)
RGYN patient presents for problem visit today.  CC: lower abdominal pain and vaginal odor. Pt had intercourse 04/08/20 1 wk later pt noticed irritation and vaginal odor and used OTC  Refresh x 2.   Pt went to urgent care for treatment on 05/25/20. Pt was treated for CT and GC.  Pt was also given diflucan. Also complains vaginal area being swollen and having itching.

## 2020-06-14 NOTE — Progress Notes (Signed)
Patient ID: Diana Campos, female   DOB: 1966/01/03, 55 y.o.   MRN: [id number]  Chief Complaint  Patient presents with  . Vaginitis  . Abdominal Pain    HPI Diana Campos is a 55 y.o. female.  Complains of malodorous vaginal discharge and odor.  Also has urinary frequency and lower abdominal pain. HPI  Past Medical History:  Diagnosis Date  . Acute heart failure ([place])   . Anemia    past hx before hysterectomy  . Anxiety   . Blood transfusion without reported diagnosis   . Chest pain    Myoview 9/21: EF 68, no ischemia; low risk  . Hypertension   . NSTEMI (non-ST elevated myocardial infarction) ([place]) 02/26/2018  . Takotsubo cardiomyopathy     Past Surgical History:  Procedure Laterality Date  . ABDOMINAL HYSTERECTOMY    . BACK SURGERY    . CARDIAC CATHETERIZATION  02/17/2018  . COLONOSCOPY     ? 2007- normal per pt- done due to eating lead and nacho chips   . LEFT HEART CATH AND CORONARY ANGIOGRAPHY N/A 02/25/2018   Procedure: LEFT HEART CATH AND CORONARY ANGIOGRAPHY;  Surgeon: Diana Crome, MD;  Location: [facility];  Service: Cardiovascular;  Laterality: N/A;  . TUBAL LIGATION    . ULTRASOUND GUIDANCE FOR VASCULAR ACCESS  02/25/2018   Procedure: Ultrasound Guidance For Vascular Access;  Surgeon: Diana Crome, MD;  Location: [facility];  Service: Cardiovascular;;    Family History  Problem Relation Age of Onset  . Hypertension Mother   . Cancer Paternal Grandmother   . Colon cancer Neg Hx   . Colon polyps Neg Hx   . Esophageal cancer Neg Hx   . Rectal cancer Neg Hx   . Stomach cancer Neg Hx     Social History Social History   Tobacco Use  . Smoking status: Never Smoker  . Smokeless tobacco: Never Used  Vaping Use  . Vaping Use: Never used  Substance Use Topics  . Alcohol use: Yes    Comment: occ  . Drug use: No    No Known Allergies  Current Outpatient Medications  Medication Sig Dispense Refill  . aspirin 81 MG chewable tablet  Chew 1 tablet (81 mg total) by mouth daily.    . diazepam (VALIUM) 2 MG tablet Take 1 tablet (2 mg total) by mouth every 12 (twelve) hours as needed for anxiety. 30 tablet 2  . diltiazem (CARDIZEM CD) 120 MG 24 hr capsule Take 1 capsule (120 mg total) by mouth daily. 90 capsule 3  . fluconazole (DIFLUCAN) 200 MG tablet Take 1 tablet (200 mg total) by mouth every 3 (three) days. 3 tablet 2  . furosemide (LASIX) 20 MG tablet Take 1 tablet (20 mg total) by mouth daily as needed for edema (or weight gain > 3 lbs in 1 day). 90 tablet 2  . metroNIDAZOLE (METROGEL VAGINAL) 0.75 % vaginal gel Place 1 Applicatorful vaginally 2 (two) times daily. 70 g 5  . phentermine 37.5 MG capsule Take 1 capsule (37.5 mg total) by mouth every morning. 30 capsule 2  . potassium chloride (MICRO-K) 10 MEQ CR capsule Take 10 mEq by mouth 2 (two) times daily.    Marland Kitchen spironolactone (ALDACTONE) 25 MG tablet Take 25 mg by mouth daily.    Marland Kitchen doxycycline (VIBRAMYCIN) 100 MG capsule Take 1 capsule (100 mg total) by mouth 2 (two) times daily. (Patient not taking: Reported on 06/14/2020) 14 capsule 0  .  nitroGLYCERIN (NITROSTAT) 0.4 MG SL tablet PLACE 1 TABLET (0.4 MG TOTAL) UNDER THE TONGUE EVERY 5 MINUTES AS NEEDED (Patient not taking: Reported on 06/14/2020) 25 tablet 3   No current facility-administered medications for this visit.    Review of Systems Review of Systems Constitutional: negative for fatigue and weight loss Respiratory: negative for cough and wheezing Cardiovascular: negative for chest pain, fatigue and palpitations Gastrointestinal: positive for lower abdominal pain and negative for change in bowel habits Genitourinary: positive for malodorous vaginal discharge with irritation, and urinary frequency Integument/breast: negative for nipple discharge Musculoskeletal:negative for myalgias Neurological: negative for gait problems and tremors Behavioral/Psych: negative for abusive relationship, depression Endocrine:  negative for temperature intolerance      Blood pressure (!) 143/90, pulse 74, height 5\' 8"  (1.727 m), weight (!) 317 lb (143.8 kg).  Physical Exam Physical Exam General:   alert and no distress  Skin:   no rash or abnormalities  Lungs:   clear to auscultation bilaterally  Heart:   regular rate and rhythm, S1, S2 normal, no murmur, click, rub or gallop  Breasts:   not examined  Abdomen:  normal findings: no organomegaly, soft, non-tender and no hernia  Pelvis:  External genitalia: normal general appearance Urinary system: urethral meatus normal and bladder without fullness, nontender Vaginal: normal without tenderness, induration or masses Cervix: absent Adnexa: normal bimanual exam Uterus: absent    50% of 15 min visit spent on counseling and coordination of care.   Data Reviewed Urine Dip-stik Wet Prep   Assessment     1. Vaginal discharge and irritation Rx: - Cervicovaginal ancillary only ( [place] )  2. Urinary frequency Rx: - POCT Urinalysis Dipstick - Urine Culture  3. Vaginal odor  4. BV (bacterial vaginosis) Rx: - metroNIDAZOLE (METROGEL VAGINAL) 0.75 % vaginal gel; Place 1 Applicatorful vaginally 2 (two) times daily.  Dispense: 70 g; Refill: 5   5. [place] vaginitis Rx: - fluconazole (DIFLUCAN) 200 MG tablet; Take 1 tablet (200 mg total) by mouth every 3 (three) days.  Dispense: 3 tablet; Refill: 2  6. Class 3 severe obesity without serious comorbidity with body mass index (BMI) of 45.0 to 49.9 in adult, unspecified obesity type ([place]) - program of caloric reduction, exercise and behavioral modification recommended Rx: - phentermine 37.5 MG capsule; Take 1 capsule (37.5 mg total) by mouth every morning.  Dispense: 30 capsule; Refill: 2    Plan  Follow up prn   Orders Placed This Encounter  Procedures  . Urine Culture  . POCT Urinalysis Dipstick   Meds ordered this encounter  Medications  . fluconazole (DIFLUCAN) 200 MG tablet    Sig: Take 1  tablet (200 mg total) by mouth every 3 (three) days.    Dispense:  3 tablet    Refill:  2  . metroNIDAZOLE (METROGEL VAGINAL) 0.75 % vaginal gel    Sig: Place 1 Applicatorful vaginally 2 (two) times daily.    Dispense:  70 g    Refill:  5  . phentermine 37.5 MG capsule    Sig: Take 1 capsule (37.5 mg total) by mouth every morning.    Dispense:  30 capsule    Refill:  2     Shelly Bombard, MD 06/14/2020 2:58 PM

## 2020-06-14 NOTE — Telephone Encounter (Signed)
[facility] called to change Phentermine caps to tabs Pt changed her mind. She will stick with the caps.

## 2020-06-15 ENCOUNTER — Ambulatory Visit: Payer: 59 | Admitting: Obstetrics

## 2020-06-15 LAB — CERVICOVAGINAL ANCILLARY ONLY
Bacterial Vaginitis (gardnerella): NEGATIVE
Candida Glabrata: NEGATIVE
Candida Vaginitis: NEGATIVE
Comment: NEGATIVE
Comment: NEGATIVE
Comment: NEGATIVE
Comment: NEGATIVE
Trichomonas: NEGATIVE

## 2020-06-17 LAB — URINE CULTURE: Organism ID, Bacteria: NO GROWTH

## 2020-09-05 ENCOUNTER — Ambulatory Visit: Payer: 59 | Admitting: Internal Medicine

## 2020-10-27 ENCOUNTER — Telehealth: Payer: Self-pay

## 2020-10-27 MED ORDER — POTASSIUM CHLORIDE ER 10 MEQ PO CPCR: 10.0000 meq | ORAL_CAPSULE | ORAL | 0 refills | Status: DC

## 2020-10-27 NOTE — Telephone Encounter (Signed)
Tried to contact the patient to get her scheduled for a follow with Richardson Dopp or Dr Harrington Challenger. No answer and mailbox full, unable to leave a voicemail.   Rx(s) sent to pharmacy electronically. (Note added for the patient to contact the office for an appointment)

## 2020-10-27 NOTE — Telephone Encounter (Signed)
Received medication clarification request from [facility] for Potassium. The instructions from our office stated take one tab three times a week. The new instructions states for the patient to take the medication bid.   Reviewed chart.   Spoke with patient who states she does not know why her OBGYN would have changed her medication because no one spoke with her about it. She states her pcp may have increased it due to her having more swelling but she states she only takes this medication when she takes the Lasix. Patient requested to have her potassium sent in the way our office prescribes it. I advised patient that I would sent the rx to the pharmacy under the instructions that we prescribe it or I can send the request to her OBGYN doctor. She states she would rather our office refill this medication.   Contacted the pharmacy who states they did not receive and new rx's from the patients OBGYN. She states the patient needs a refill on the Potassium however she takes it.   Patient will need a follow up.

## 2020-11-18 ENCOUNTER — Other Ambulatory Visit: Payer: Self-pay | Admitting: Physician Assistant

## 2020-11-20 ENCOUNTER — Other Ambulatory Visit: Payer: Self-pay | Admitting: *Deleted

## 2020-11-20 MED ORDER — POTASSIUM CHLORIDE ER 10 MEQ PO CPCR: 10.0000 meq | ORAL_CAPSULE | ORAL | 0 refills | Status: DC

## 2020-11-26 ENCOUNTER — Ambulatory Visit
Admission: EM | Admit: 2020-11-26 | Discharge: 2020-11-26 | Disposition: A | Discharge status: Home / Self Care | Payer: 59 | Attending: Urgent Care | Admitting: Urgent Care

## 2020-11-26 DIAGNOSIS — J018 Other acute sinusitis: Secondary | ICD-10-CM | POA: Diagnosis not present

## 2020-11-26 DIAGNOSIS — Z8679 Personal history of other diseases of the circulatory system: Secondary | ICD-10-CM

## 2020-11-26 DIAGNOSIS — I252 Old myocardial infarction: Secondary | ICD-10-CM

## 2020-11-26 DIAGNOSIS — Z1152 Encounter for screening for COVID-19: Secondary | ICD-10-CM

## 2020-11-26 DIAGNOSIS — R059 Cough, unspecified: Secondary | ICD-10-CM

## 2020-11-26 DIAGNOSIS — R0981 Nasal congestion: Secondary | ICD-10-CM

## 2020-11-26 DIAGNOSIS — I5181 Takotsubo syndrome: Secondary | ICD-10-CM

## 2020-11-26 MED ORDER — CETIRIZINE HCL 10 MG PO TABS: 10.0000 mg | ORAL_TABLET | Freq: Every day | ORAL | 0 refills | Status: DC

## 2020-11-26 MED ORDER — AMOXICILLIN-POT CLAVULANATE 875-125 MG PO TABS: 1.0000 | ORAL_TABLET | Freq: Two times a day (BID) | ORAL | 0 refills | Status: DC

## 2020-11-26 MED ORDER — PSEUDOEPHEDRINE HCL 60 MG PO TABS: 60.0000 mg | ORAL_TABLET | Freq: Three times a day (TID) | ORAL | 0 refills | Status: DC | PRN

## 2020-11-26 MED ORDER — BENZONATATE 100 MG PO CAPS: 100.0000 mg | ORAL_CAPSULE | Freq: Three times a day (TID) | ORAL | 0 refills | Status: DC | PRN

## 2020-11-26 MED ORDER — PROMETHAZINE-DM 6.25-15 MG/5ML PO SYRP: 5.0000 mL | ORAL_SOLUTION | Freq: Every evening | ORAL | 0 refills | Status: DC | PRN

## 2020-11-26 MED ORDER — FLUCONAZOLE 150 MG PO TABS: 150.0000 mg | ORAL_TABLET | ORAL | 0 refills | Status: DC

## 2020-11-26 NOTE — ED Triage Notes (Addendum)
Pt present coughing, sore throat with body aches, symptoms started two weeks ago. Pt cough is productive with green mucous. Pt tried otc medication with no relief.

## 2020-11-26 NOTE — ED Provider Notes (Signed)
[facility]   MRN: [id number] DOB: 12/25/1965  Subjective:   Diana Campos is a 55 y.o. female presenting for 2-week history of persistent cough that has progressively worsened and now causing sinus pressure and sinus pain.  She initially felt congestion in her chest but that has cleared up.  She was having chest pain with her cough but that her primary problem is with her sinuses.  She is also having throat pain and feels like there is a lot of drainage and swelling in her throat.  She has used over-the-counter medications without relief.  Denies active chest pain, shortness of breath.  She does have a history of Takotsubo cardiomyopathy, NSTEMI, acute heart failure.  Has done well with her follow-up in this regard.  Has not needed to use nitroglycerin.  Patient is not a smoker, no history of respiratory disorders such as COPD or asthma.  No current facility-administered medications for this encounter.  Current Outpatient Medications:    aspirin 81 MG chewable tablet, Chew 1 tablet (81 mg total) by mouth daily., Disp: , Rfl:    diazepam (VALIUM) 2 MG tablet, Take 1 tablet (2 mg total) by mouth every 12 (twelve) hours as needed for anxiety., Disp: 30 tablet, Rfl: 2   diltiazem (CARDIZEM CD) 120 MG 24 hr capsule, Take 1 capsule (120 mg total) by mouth daily., Disp: 90 capsule, Rfl: 3   doxycycline (VIBRAMYCIN) 100 MG capsule, Take 1 capsule (100 mg total) by mouth 2 (two) times daily. (Patient not taking: Reported on 06/14/2020), Disp: 14 capsule, Rfl: 0   fluconazole (DIFLUCAN) 200 MG tablet, Take 1 tablet (200 mg total) by mouth every 3 (three) days., Disp: 3 tablet, Rfl: 2   furosemide (LASIX) 20 MG tablet, Take 1 tablet (20 mg total) by mouth daily as needed for edema (or weight gain > 3 lbs in 1 day)., Disp: 90 tablet, Rfl: 2   metroNIDAZOLE (METROGEL VAGINAL) 0.75 % vaginal gel, Place 1 Applicatorful vaginally 2 (two) times daily., Disp: 70 g, Rfl: 5   nitroGLYCERIN (NITROSTAT)  0.4 MG SL tablet, PLACE 1 TABLET (0.4 MG TOTAL) UNDER THE TONGUE EVERY 5 MINUTES AS NEEDED (Patient not taking: Reported on 06/14/2020), Disp: 25 tablet, Rfl: 3   phentermine 37.5 MG capsule, Take 1 capsule (37.5 mg total) by mouth every morning., Disp: 30 capsule, Rfl: 2   potassium chloride (MICRO-K) 10 MEQ CR capsule, Take 1 capsule (10 mEq total) by mouth 3 (three) times a week. Mon/Wed/Fri--PLEASE CONTACT OFFICE FOR AN APPOINTMENT 1ST ATTEMPT, Disp: 7 capsule, Rfl: 0   spironolactone (ALDACTONE) 25 MG tablet, Take 25 mg by mouth daily., Disp: , Rfl:    No Known Allergies  Past Medical History:  Diagnosis Date   Acute heart failure ([place])    Anemia    past hx before hysterectomy   Anxiety    Blood transfusion without reported diagnosis    Chest pain    Myoview 9/21: EF 68, no ischemia; low risk   Hypertension    NSTEMI (non-ST elevated myocardial infarction) ([place]) 02/26/2018   Takotsubo cardiomyopathy      Past Surgical History:  Procedure Laterality Date   ABDOMINAL HYSTERECTOMY     BACK SURGERY     CARDIAC CATHETERIZATION  02/17/2018   COLONOSCOPY     ? 2007- normal per pt- done due to eating lead and nacho chips    LEFT HEART CATH AND CORONARY ANGIOGRAPHY N/A 02/25/2018   Procedure: LEFT HEART CATH AND CORONARY ANGIOGRAPHY;  Surgeon: Belva Crome, MD;  Location: [facility];  Service: Cardiovascular;  Laterality: N/A;   TUBAL LIGATION     ULTRASOUND GUIDANCE FOR VASCULAR ACCESS  02/25/2018   Procedure: Ultrasound Guidance For Vascular Access;  Surgeon: Belva Crome, MD;  Location: [facility];  Service: Cardiovascular;;    Family History  Problem Relation Age of Onset   Hypertension Mother    Cancer Paternal Grandmother    Colon cancer Neg Hx    Colon polyps Neg Hx    Esophageal cancer Neg Hx    Rectal cancer Neg Hx    Stomach cancer Neg Hx     Social History   Tobacco Use   Smoking status: Never   Smokeless tobacco: Never  Vaping Use   Vaping  Use: Never used  Substance Use Topics   Alcohol use: Yes    Comment: occ   Drug use: No    ROS   Objective:   Vitals: BP 128/86 (BP Location: Left Arm)   Pulse 78   Temp 97.8 F (36.6 C) (Oral)   Resp 18   SpO2 95%   Physical Exam Constitutional:      General: She is not in acute distress.    Appearance: Normal appearance. She is well-developed. She is not ill-appearing, toxic-appearing or diaphoretic.  HENT:     Head: Normocephalic and atraumatic.     Nose: Nose normal.     Mouth/Throat:     Mouth: Mucous membranes are moist.     Pharynx: Oropharynx is clear. Posterior oropharyngeal erythema present. No pharyngeal swelling, oropharyngeal exudate or uvula swelling.     Comments: Significant postnasal drainage overlying pharynx. Eyes:     General: No scleral icterus.    Extraocular Movements: Extraocular movements intact.     Pupils: Pupils are equal, round, and reactive to light.  Cardiovascular:     Rate and Rhythm: Normal rate and regular rhythm.     Pulses: Normal pulses.     Heart sounds: Normal heart sounds. No murmur heard.   No friction rub. No gallop.  Pulmonary:     Effort: Pulmonary effort is normal. No respiratory distress.     Breath sounds: Normal breath sounds. No stridor. No wheezing, rhonchi or rales.  Skin:    General: Skin is warm and dry.     Findings: No rash.  Neurological:     General: No focal deficit present.     Mental Status: She is alert and oriented to person, place, and time.  Psychiatric:        Mood and Affect: Mood normal.        Behavior: Behavior normal.        Thought Content: Thought content normal.    Assessment and Plan :   PDMP not reviewed this encounter.  1. Acute non-recurrent sinusitis of other sinus   2. Sinus congestion   3. Cough   4. Encounter for screening for COVID-19   5. Takotsubo cardiomyopathy   6. History of heart failure   7. History of non-ST elevation myocardial infarction (NSTEMI)     Will  start empiric treatment for sinusitis with Augmentin.  Recommended supportive care otherwise including the use of oral antihistamine, decongestant.  Patient has used vaginitis with the use of antibiotics and therefore will have her use fluconazole for this.  Given clear cardiopulmonary exam and the primary complaint being with her sinuses we will hold off on imaging.  Patient does have hemodynamically  stable vital signs, reasonable for outpatient management.  COVID-19 testing, flu testing pending.  Counseled patient on potential for adverse effects with medications prescribed/recommended today, ER and return-to-clinic precautions discussed, patient verbalized understanding.    Jaynee Eagles, [place] 11/26/20 [phone]

## 2020-11-28 LAB — COVID-19, FLU A+B NAA
Influenza A, NAA: NOT DETECTED
Influenza B, NAA: NOT DETECTED
SARS-CoV-2, NAA: NOT DETECTED

## 2020-12-15 ENCOUNTER — Other Ambulatory Visit: Payer: Self-pay | Admitting: Physician Assistant

## 2020-12-18 ENCOUNTER — Other Ambulatory Visit: Payer: Self-pay | Admitting: Obstetrics

## 2020-12-18 DIAGNOSIS — B3731 Acute candidiasis of vulva and vagina: Secondary | ICD-10-CM

## 2020-12-18 DIAGNOSIS — B373 Candidiasis of vulva and vagina: Secondary | ICD-10-CM

## 2021-02-17 ENCOUNTER — Other Ambulatory Visit: Payer: Self-pay | Admitting: Physician Assistant

## 2021-03-15 ENCOUNTER — Other Ambulatory Visit: Payer: Self-pay | Admitting: Physician Assistant

## 2021-03-18 ENCOUNTER — Other Ambulatory Visit: Payer: Self-pay | Admitting: Physician Assistant

## 2021-04-14 ENCOUNTER — Other Ambulatory Visit: Payer: Self-pay | Admitting: Internal Medicine

## 2021-04-19 ENCOUNTER — Other Ambulatory Visit: Payer: Self-pay | Admitting: Physician Assistant

## 2021-04-26 ENCOUNTER — Other Ambulatory Visit: Payer: Self-pay | Admitting: Internal Medicine

## 2021-04-27 ENCOUNTER — Other Ambulatory Visit: Payer: Self-pay | Admitting: *Deleted

## 2021-04-27 MED ORDER — SPIRONOLACTONE 25 MG PO TABS: 25.0000 mg | ORAL_TABLET | Freq: Every day | ORAL | 0 refills | Status: DC

## 2021-05-05 ENCOUNTER — Other Ambulatory Visit: Payer: Self-pay

## 2021-05-05 ENCOUNTER — Encounter: Payer: Self-pay | Admitting: Emergency Medicine

## 2021-05-05 ENCOUNTER — Ambulatory Visit
Admission: EM | Admit: 2021-05-05 | Discharge: 2021-05-05 | Disposition: A | Discharge status: Home / Self Care | Payer: 59

## 2021-05-05 DIAGNOSIS — L02411 Cutaneous abscess of right axilla: Secondary | ICD-10-CM | POA: Diagnosis not present

## 2021-05-05 MED ORDER — DOXYCYCLINE HYCLATE 100 MG PO CAPS: 100.0000 mg | ORAL_CAPSULE | Freq: Two times a day (BID) | ORAL | 0 refills | Status: DC

## 2021-05-05 MED ORDER — FLUCONAZOLE 150 MG PO TABS: 150.0000 mg | ORAL_TABLET | Freq: Every day | ORAL | 0 refills | Status: DC

## 2021-05-05 NOTE — ED Triage Notes (Signed)
Patient c/o abscess under her right arm x 2 weeks, worsened over the last few days.  Drainage from the area in color, painful and swelling.  Applied peroxide to the area.

## 2021-05-05 NOTE — Discharge Instructions (Signed)
You are being treated with an antibiotic for abscess to right armpit.  Please also use warm compresses to area to help soften.  Go to the hospital if no improvement in appearance or if it worsens in the next 24 to 48 hours.

## 2021-05-05 NOTE — ED Provider Notes (Signed)
[name]    CSN: [id number] Arrival date & time: 05/05/21  0801      History   Chief Complaint Chief Complaint  Patient presents with   Abscess    HPI CADEN FATICA is a 56 y.o. female.   Patient presents with abscess to right axilla that has been present for approximately 2 weeks.  She reports that it has seemed to worsen over the last few days and has been having purulent drainage with increased pain and swelling.  Denies any fevers, bodies, chills at home.  Denies history of any abscesses to any part of the body.  She has used hydrogen peroxide to the area.  Reports that she put a dressing with tape over the area, but the tape caused a discoloration that seemed to "burn her".   Abscess  Past Medical History:  Diagnosis Date   Acute heart failure ([place])    Anemia    past hx before hysterectomy   Anxiety    Blood transfusion without reported diagnosis    Chest pain    Myoview 9/21: EF 68, no ischemia; low risk   Hypertension    NSTEMI (non-ST elevated myocardial infarction) ([place]) 02/26/2018   Takotsubo cardiomyopathy     Patient Active Problem List   Diagnosis Date Noted   Dyslipidemia 02/28/2018   Takotsubo cardiomyopathy 02/28/2018   NSTEMI (non-ST elevated myocardial infarction) ([place]) 02/25/2018   Anxiety 12/16/2012   BV (bacterial vaginosis) 12/16/2012   Obesity 12/16/2012    Past Surgical History:  Procedure Laterality Date   ABDOMINAL HYSTERECTOMY     BACK SURGERY     CARDIAC CATHETERIZATION  02/17/2018   COLONOSCOPY     ? 2007- normal per pt- done due to eating lead and nacho chips    LEFT HEART CATH AND CORONARY ANGIOGRAPHY N/A 02/25/2018   Procedure: LEFT HEART CATH AND CORONARY ANGIOGRAPHY;  Surgeon: Belva Crome, MD;  Location: [facility];  Service: Cardiovascular;  Laterality: N/A;   TUBAL LIGATION     ULTRASOUND GUIDANCE FOR VASCULAR ACCESS  02/25/2018   Procedure: Ultrasound Guidance For Vascular Access;  Surgeon:  Belva Crome, MD;  Location: [facility];  Service: Cardiovascular;;    OB History     Gravida  4   Para  3   Term      Preterm      AB  1   Living  3      SAB      IAB  1   Ectopic      Multiple      Live Births  3            Home Medications    Prior to Admission medications   Medication Sig Start Date End Date Taking? Authorizing Provider  aspirin 81 MG chewable tablet Chew 1 tablet (81 mg total) by mouth daily. 03/01/18  Yes Kroeger, Daleen Snook M., PA-C  diltiazem (CARDIZEM CD) 120 MG 24 hr capsule Take 1 capsule (120 mg total) by mouth daily. Please make overdue appt with Dr. Harrington Challenger before anymore refills. Thank you 3rd and Final Attempt 04/16/21  Yes Fay Records, MD  doxycycline (VIBRAMYCIN) 100 MG capsule Take 1 capsule (100 mg total) by mouth 2 (two) times daily. 05/05/21  Yes Solenne Manwarren, Hildred Alamin E, FNP  fluconazole (DIFLUCAN) 150 MG tablet Take 1 tablet (150 mg total) by mouth daily. May take at first sign of vaginal yeast 05/05/21  Yes [place], Meeker E,  FNP  furosemide (LASIX) 20 MG tablet Take 1 tablet (20 mg total) by mouth daily as needed for edema (or weight gain > 3 lbs in 1 day). 04/19/20 05/05/21 Yes Fay Records, MD  phentermine 37.5 MG capsule Take 1 capsule (37.5 mg total) by mouth every morning. 06/14/20  Yes Shelly Bombard, MD  potassium chloride (MICRO-K) 10 MEQ CR capsule Take 1 capsule (10 mEq total) by mouth 3 (three) times a week. Mon/Wed/Fri--PLEASE CONTACT OFFICE FOR AN APPOINTMENT 1ST ATTEMPT 11/20/20  Yes Richardson Dopp T, PA-C  spironolactone (ALDACTONE) 25 MG tablet Take 1 tablet (25 mg total) by mouth daily. Please make overdue appt with Dr. Harrington Challenger before anymore refills. Thank you 2nd attempt 04/27/21  Yes Fay Records, MD  topiramate (TOPAMAX) 50 MG tablet Take 50 mg by mouth daily. 03/17/21  Yes [provider]  amoxicillin-clavulanate (AUGMENTIN) 875-125 MG tablet Take 1 tablet by mouth every 12 (twelve) hours. 11/26/20   Jaynee Eagles,  PA-C  benzonatate (TESSALON) 100 MG capsule Take 1-2 capsules (100-200 mg total) by mouth 3 (three) times daily as needed. 11/26/20   Jaynee Eagles, PA-C  cetirizine (ZYRTEC ALLERGY) 10 MG tablet Take 1 tablet (10 mg total) by mouth daily. 11/26/20   Jaynee Eagles, PA-C  diazepam (VALIUM) 2 MG tablet Take 1 tablet (2 mg total) by mouth every 12 (twelve) hours as needed for anxiety. 08/18/19   Shelly Bombard, MD  metroNIDAZOLE (METROGEL VAGINAL) 0.75 % vaginal gel Place 1 Applicatorful vaginally 2 (two) times daily. 06/14/20   Shelly Bombard, MD  nitroGLYCERIN (NITROSTAT) 0.4 MG SL tablet PLACE 1 TABLET (0.4 MG TOTAL) UNDER THE TONGUE EVERY 5 MINUTES AS NEEDED Patient not taking: Reported on 06/14/2020 05/06/19   Leanor Kail, PA  promethazine-dextromethorphan (PROMETHAZINE-DM) 6.25-15 MG/5ML syrup Take 5 mLs by mouth at bedtime as needed for cough. 11/26/20   Jaynee Eagles, PA-C  pseudoephedrine (SUDAFED) 60 MG tablet Take 1 tablet (60 mg total) by mouth every 8 (eight) hours as needed for congestion. 11/26/20   Jaynee Eagles, PA-C  atorvastatin (LIPITOR) 40 MG tablet TAKE 1 TABLET BY MOUTH ONCE DAILY AT  6  PM 06/28/19 05/25/20  Richardson Dopp T, PA-C  isosorbide mononitrate (IMDUR) 30 MG 24 hr tablet Take 0.5 tablets (15 mg total) by mouth daily. 01/04/20 05/25/20  Liliane Shi, PA-C    Family History Family History  Problem Relation Age of Onset   Hypertension Mother    Cancer Paternal Grandmother    Colon cancer Neg Hx    Colon polyps Neg Hx    Esophageal cancer Neg Hx    Rectal cancer Neg Hx    Stomach cancer Neg Hx     Social History Social History   Tobacco Use   Smoking status: Never   Smokeless tobacco: Never  Vaping Use   Vaping Use: Never used  Substance Use Topics   Alcohol use: Yes    Comment: occ   Drug use: No     Allergies   Patient has no known allergies.   Review of Systems Review of Systems Per HPI  Physical Exam Triage Vital Signs ED Triage Vitals  Enc  Vitals Group     BP 05/05/21 0815 112/82     Pulse Rate 05/05/21 0815 75     Resp 05/05/21 0815 18     Temp 05/05/21 0815 99.4 F (37.4 C)     Temp Source 05/05/21 0815 Oral     SpO2 05/05/21 0815  96 %     Weight 05/05/21 0816 (!) 302 lb (137 kg)     Height 05/05/21 0816 5\' 8"  (1.727 m)     Head Circumference --      Peak Flow --      Pain Score 05/05/21 0816 6     Pain Loc --      Pain Edu? --      Excl. in [place]? --    No data found.  Updated Vital Signs BP 112/82 (BP Location: Left Arm)    Pulse 75    Temp 99.4 F (37.4 C) (Oral)    Resp 18    Ht 5\' 8"  (1.727 m)    Wt (!) 302 lb (137 kg)    SpO2 96%    BMI 45.92 kg/m   Visual Acuity Right Eye Distance:   Left Eye Distance:   Bilateral Distance:    Right Eye Near:   Left Eye Near:    Bilateral Near:     Physical Exam Constitutional:      General: She is not in acute distress.    Appearance: Normal appearance. She is not toxic-appearing or diaphoretic.  HENT:     Head: Normocephalic and atraumatic.  Eyes:     Extraocular Movements: Extraocular movements intact.     Conjunctiva/sclera: Conjunctivae normal.  Pulmonary:     Effort: Pulmonary effort is normal.  Skin:    Findings: Abscess present.     Comments: Patient has indurated abscess that is present throughout entire right axilla.  There is purulent drainage noted from center of the axilla.  There is also some erythema and slight discoloration noted to medial axilla that is from tape per patient.  Patient has full range of motion of right upper extremity.  Grip strength 5/5.  Neurovascular intact.  Neurological:     General: No focal deficit present.     Mental Status: She is alert and oriented to person, place, and time. Mental status is at baseline.  Psychiatric:        Mood and Affect: Mood normal.        Behavior: Behavior normal.        Thought Content: Thought content normal.        Judgment: Judgment normal.     UC Treatments / Results  Labs (all  labs ordered are listed, but only abnormal results are displayed) Labs Reviewed - No data to display  EKG   Radiology No results found.  Procedures Procedures (including critical care time)  Medications Ordered in UC Medications - No data to display  Initial Impression / Assessment and Plan / UC Course  I have reviewed the triage vital signs and the nursing notes.  Pertinent labs & imaging results that were available during my care of the patient were reviewed by me and considered in my medical decision making (see chart for details).     Lesion to axilla consistent with abscess.  No I&D is necessary at this time as it is indurated and would not benefit from drainage.  Will prescribe doxycycline antibiotic.  Due to size of abscess, patient was advised to go to the hospital if no improvement in the next 24 to 48 hours.  Patient to monitor fevers as well.  Discussed warm compresses.  Patient requesting Diflucan as antibiotics typically give her yeast infection.  Will send 1 pill for patient to take at for sign of vaginal yeast.  Discussed return precautions.  Patient verbalized understanding and was  agreeable with plan. Final Clinical Impressions(s) / UC Diagnoses   Final diagnoses:  Abscess of right axilla     Discharge Instructions      You are being treated with an antibiotic for abscess to right armpit.  Please also use warm compresses to area to help soften.  Go to the hospital if no improvement in appearance or if it worsens in the next 24 to 48 hours.    ED Prescriptions     Medication Sig Dispense Auth. Provider   doxycycline (VIBRAMYCIN) 100 MG capsule Take 1 capsule (100 mg total) by mouth 2 (two) times daily. 20 capsule Hoople, Hawi E, [place]   fluconazole (DIFLUCAN) 150 MG tablet Take 1 tablet (150 mg total) by mouth daily. May take at first sign of vaginal yeast 1 tablet [place], Michele Rockers, [place]      PDMP not reviewed this encounter.   Teodora Medici,  05/05/21  [phone]

## 2021-05-14 ENCOUNTER — Other Ambulatory Visit: Payer: Self-pay | Admitting: Internal Medicine

## 2021-06-13 ENCOUNTER — Other Ambulatory Visit: Payer: Self-pay | Admitting: Internal Medicine

## 2021-10-22 ENCOUNTER — Encounter: Payer: Self-pay | Admitting: Obstetrics & Gynecology

## 2021-10-22 ENCOUNTER — Ambulatory Visit (INDEPENDENT_AMBULATORY_CARE_PROVIDER_SITE_OTHER): Payer: 59 | Admitting: Obstetrics & Gynecology

## 2021-10-22 VITALS — BP 115/80 | HR 74 | Ht 68.0 in | Wt 279.0 lb

## 2021-10-22 DIAGNOSIS — N3010 Interstitial cystitis (chronic) without hematuria: Secondary | ICD-10-CM

## 2021-10-22 DIAGNOSIS — Z1231 Encounter for screening mammogram for malignant neoplasm of breast: Secondary | ICD-10-CM | POA: Diagnosis not present

## 2021-10-22 LAB — POCT URINALYSIS DIPSTICK
Bilirubin, UA: NEGATIVE
Blood, UA: NEGATIVE
Glucose, UA: NEGATIVE
Leukocytes, UA: NEGATIVE
Nitrite, UA: NEGATIVE
Protein, UA: POSITIVE — AB
Spec Grav, UA: >=1.03 — AB (ref 1.010–1.025)
Urobilinogen, UA: 0.2 E.U./dL
pH, UA: 6 (ref 5.0–8.0)

## 2021-10-22 MED ORDER — AMITRIPTYLINE HCL 25 MG PO TABS: 25.0000 mg | ORAL_TABLET | Freq: Every day | ORAL | 3 refills | Status: AC

## 2021-10-22 NOTE — Progress Notes (Signed)
GYNECOLOGY OFFICE VISIT NOTE  History:   Diana Campos is a 56 y.o. [phone] here today requesting refill of medication for interstitial cystitis.  Was on Amitriptyline, which was prescribed by Dr. Zigmund Daniel (Urogynecology at [place]).  This medication has been refilled by Dr. Jodi Mourning as needed. Her symptoms abated over the last few years but have recently flared up again. No fevers, chills, back pain, just feels her bladder is inflammed.   She denies any abnormal vaginal discharge, bleeding, pelvic pain or other concerns.    Past Medical History:  Diagnosis Date   Acute heart failure ([place])    Anemia    past hx before hysterectomy   Anxiety    Blood transfusion without reported diagnosis    Chest pain    Myoview 9/21: EF 68, no ischemia; low risk   Hypertension    NSTEMI (non-ST elevated myocardial infarction) ([place]) 02/26/2018   Takotsubo cardiomyopathy     Past Surgical History:  Procedure Laterality Date   ABDOMINAL HYSTERECTOMY     BACK SURGERY     CARDIAC CATHETERIZATION  02/17/2018   COLONOSCOPY     ? 2007- normal per pt- done due to eating lead and nacho chips    COLPORRHAPHY  10/24/2014   Posterior, with uterosacral ligament suspension   LEFT HEART CATH AND CORONARY ANGIOGRAPHY N/A 02/25/2018   Procedure: LEFT HEART CATH AND CORONARY ANGIOGRAPHY;  Surgeon: Belva Crome, MD;  Location: [facility];  Service: Cardiovascular;  Laterality: N/A;   TRACHELECTOMY  10/24/2014   TUBAL LIGATION     ULTRASOUND GUIDANCE FOR VASCULAR ACCESS  02/25/2018   Procedure: Ultrasound Guidance For Vascular Access;  Surgeon: Belva Crome, MD;  Location: [facility];  Service: Cardiovascular;;    The following portions of the patient's history were reviewed and updated as appropriate: allergies, current medications, past family history, past medical history, past social history, past surgical history and problem list.   Health Maintenance:  She had a supracervical hysterectomy  in 2010 followed by a trachelectomy in 2016 for benign indications, no pap smear screening needed. Normal mammogram on 10/22/2019. Benign colonoscopy 09/13/2019.  Review of Systems:  Pertinent items noted in HPI and remainder of comprehensive ROS otherwise negative.  Physical Exam:  BP 115/80   Pulse 74   Ht '5\' 8"'$  (1.727 m)   Wt 279 lb (126.6 kg)   BMI 42.42 kg/m  CONSTITUTIONAL: Well-developed, well-nourished female in no acute distress.  HEENT:  Normocephalic, atraumatic. External right and left ear normal. No scleral icterus.  NECK: Normal range of motion, supple, no masses noted on observation SKIN: No rash noted. Not diaphoretic. No erythema. No pallor. MUSCULOSKELETAL: Normal range of motion. No edema noted. NEUROLOGIC: Alert and oriented to person, place, and time. Normal muscle tone coordination. No cranial nerve deficit noted. PSYCHIATRIC: Normal mood and affect. Normal behavior. Normal judgment and thought content. CARDIOVASCULAR: Normal heart rate noted RESPIRATORY: Effort and breath sounds normal, no problems with respiration noted ABDOMEN: No masses noted. No other overt distention noted.  Nontender. PELVIC: Deferred  Labs and Imaging Results for orders placed or performed in visit on 10/22/21 (from the past 168 hour(s))  POCT urinalysis dipstick   Collection Time: 10/22/21 11:20 AM  Result Value Ref Range   Color, UA golden    Clarity, UA clear    Glucose, UA Negative Negative   Bilirubin, UA negative    Ketones, UA small    Spec Grav, UA >=1.030 (A) 1.010 -  1.025   Blood, UA negative    pH, UA 6.0 5.0 - 8.0   Protein, UA Positive (A) Negative   Urobilinogen, UA 0.2 0.2 or 1.0 E.U./dL   Nitrite, UA negative    Leukocytes, UA Negative Negative   Appearance     Odor     No results found.    Assessment and Plan:     1. Breast cancer screening by mammogram Mammogram scheduled - MM 3D SCREEN BREAST BILATERAL; Future  2. Interstitial cystitis No overt evidence  of UTI.  Prescribed known dosage of Amitriptyline upon review of noted by Dr. Zigmund Daniel on [facility].  She was told to follow up with Dr. Zigmund Daniel if symptoms worsen. - amitriptyline (ELAVIL) 25 MG tablet; Take 1 tablet (25 mg total) by mouth at bedtime.  Dispense: 90 tablet; Refill: 3 - Urine Culture - POCT urinalysis dipstick  Routine preventative health maintenance measures emphasized. Please refer to After Visit Summary for other counseling recommendations.   Return for Annual exam when patient desires, prefers Dr. Jodi Mourning.     Verita Schneiders, MD, [place] for [employer], [place]

## 2021-10-23 ENCOUNTER — Ambulatory Visit (INDEPENDENT_AMBULATORY_CARE_PROVIDER_SITE_OTHER): Payer: 59 | Admitting: Physician Assistant

## 2021-10-23 ENCOUNTER — Encounter: Payer: Self-pay | Admitting: Physician Assistant

## 2021-10-23 VITALS — BP 118/80 | HR 77 | Ht 68.0 in | Wt 278.4 lb

## 2021-10-23 DIAGNOSIS — I5181 Takotsubo syndrome: Secondary | ICD-10-CM

## 2021-10-23 DIAGNOSIS — I10 Essential (primary) hypertension: Secondary | ICD-10-CM | POA: Insufficient documentation

## 2021-10-23 DIAGNOSIS — E7800 Pure hypercholesterolemia, unspecified: Secondary | ICD-10-CM | POA: Insufficient documentation

## 2021-10-23 DIAGNOSIS — I5032 Chronic diastolic (congestive) heart failure: Secondary | ICD-10-CM

## 2021-10-23 DIAGNOSIS — R072 Precordial pain: Secondary | ICD-10-CM

## 2021-10-23 MED ORDER — NITROGLYCERIN 0.4 MG SL SUBL: SUBLINGUAL_TABLET | SUBLINGUAL | 3 refills | Status: DC

## 2021-10-23 MED ORDER — FUROSEMIDE 20 MG PO TABS: 20.0000 mg | ORAL_TABLET | ORAL | 3 refills | Status: DC

## 2021-10-23 MED ORDER — POTASSIUM CHLORIDE CRYS ER 10 MEQ PO TBCR: 10.0000 meq | EXTENDED_RELEASE_TABLET | ORAL | 3 refills | Status: DC

## 2021-10-23 NOTE — Assessment & Plan Note (Addendum)
She has had occasional chest pain in the past. She takes NTG with relief. Her chest pain symptoms are somewhat rare. She had a normal cath when she presented with Tako tsubo CM. A Myoview in 2021 was normal. She may have an element of microvascular angina vs spasm. Continue diltiazem, NTG prn.

## 2021-10-23 NOTE — Patient Instructions (Addendum)
Medication Instructions:  Your physician has recommended you make the following change in your medication:   STOP Phentermine  STOP Hydrochlorothiazide  START Lasix 20 mg taking only on Monday, Wednesday, & Friday's  START Potassium 10 meq taking only on Monday, Wednesday, & Friday's .  *If you need a refill on your cardiac medications before your next appointment, please call your pharmacy*   Lab Work: None ordered  If you have labs (blood work) drawn today and your tests are completely normal, you will receive your results only by: [place] (if you have MyChart) OR A paper copy in the mail If you have any lab test that is abnormal or we need to change your treatment, we will call you to review the results.   Testing/Procedures: None ordered   Follow-Up: At [facility] you and your health needs are our priority.  As part of our continuing mission to provide you with exceptional heart care, we have created designated Provider Care Teams.  These Care Teams include your primary Cardiologist (physician) and Advanced Practice Providers (APPs -  Physician Assistants and Nurse Practitioners) who all work together to provide you with the care you need, when you need it.  We recommend signing up for the patient portal called "MyChart".  Sign up information is provided on this After Visit Summary.  MyChart is used to connect with patients for Virtual Visits (Telemedicine).  Patients are able to view lab/test results, encounter notes, upcoming appointments, etc.  Non-urgent messages can be sent to your provider as well.   To learn more about what you can do with MyChart, go to [email or web address].    Your next appointment:   6 month(s)  The format for your next appointment:   In Person  Provider:   Dorris Carnes, MD     Other Instructions   Important Information About Sugar

## 2021-10-23 NOTE — Assessment & Plan Note (Signed)
She had full recovery of LV function. Last echocardiogram in 2019 with EF 60-65.

## 2021-10-23 NOTE — Assessment & Plan Note (Signed)
She is on phentermine for weight loss. She was given topiramate with it for weight loss as well. Phentermine is not a good drug to be on given her past cardiac hx. I recommend that she DC both of these drugs. I will refer her to our PharmD weight loss clinic to see if we can get her on semaglutide.

## 2021-10-23 NOTE — Progress Notes (Signed)
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 Cath w/ normal coronary arteries EF 35 >> 55 by echocardiogram 02/2018 Limited echo 03/03/2018: EF 60-65, Gr 1 DD, normal wall motion Diastolic CHF Echocardiogram 11/19: EF 60-65, Gr 1 DD Myoview 12/2019: low risk  Hypertension Hyperlipidemia  Anxiety  Obesity  Prior CV Studies: Myoview 12/30/19 EF 68, no ischemia, low risk   Limited echocardiogram 03/03/2018 Mild focal basal septal hypertrophy, EF 15-83, grade 1 diastolic dysfunction   Echocardiogram 02/26/2018 Severe hypokinesis to akinesis of the apex, EF 55, grade 1 diastolic dysfunction   Cardiac catheterization 02/25/2018 Normal left main Widely patent LAD  Normal circumflex Normal ramus intermedius Anteroapical akinesis/dyskinesis compatible with Takotsubo/stress cardiomyopathy Acute systolic heart failure with EF 35% and LVEDP 25 mmHg.  History of Present Illness:   Diana Campos is a 56 y.o. female with the above problem list.  She was last seen in Sept 2021 and returns for f/u.  She is here alone. She has shortness of breath with overexertion.  Otherwise, she is not having significant shortness of breath. She has rare chest pain that is relieved with NTG. She otherwise does not have exertional chest pain. She has not had syncope, orthopnea, leg edema. She was switched to HCTZ and has noted less diuresis with this. Her BPs have been well controlled (even before the switch). She had some cystitis symptoms that started after starting on HCTZ.     Past Medical History:  Diagnosis Date   Acute heart failure ([place])    Anemia    past hx before hysterectomy   Anxiety    Blood transfusion without  reported diagnosis    Chest pain    Myoview 9/21: EF 68, no ischemia; low risk   Hypertension    NSTEMI (non-ST elevated myocardial infarction) ([place]) 02/26/2018   Takotsubo cardiomyopathy    Current Medications: Current Meds  Medication Sig   amitriptyline (ELAVIL) 25 MG tablet Take 1 tablet (25 mg total) by mouth at bedtime.   aspirin 81 MG chewable tablet Chew 1 tablet (81 mg total) by mouth daily.   diltiazem (CARDIZEM CD) 120 MG 24 hr capsule Take 1 capsule (120 mg total) by mouth daily. Please make overdue appt with Dr. Harrington Challenger before anymore refills. Thank you 3rd and Final Attempt   [START ON 10/24/2021] furosemide (LASIX) 20 MG tablet Take 1 tablet (20 mg total) by mouth every Monday, Wednesday, and Friday.   [START ON 10/24/2021] potassium chloride (KLOR-CON M) 10 MEQ tablet Take 1 tablet (10 mEq total) by mouth every Monday, Wednesday, and Friday.   spironolactone (ALDACTONE) 25 MG tablet Take 1 tablet (25 mg total) by mouth daily. Please make overdue appt with Dr. Harrington Challenger before anymore refills. Thank you 2nd attempt   topiramate (TOPAMAX) 50 MG tablet Take 50 mg by mouth daily.   [DISCONTINUED] hydrochlorothiazide (HYDRODIURIL) 25 MG tablet Take 25 mg by mouth daily.   [DISCONTINUED] nitroGLYCERIN (NITROSTAT) 0.4 MG SL tablet PLACE 1 TABLET (0.4 MG TOTAL) UNDER THE TONGUE EVERY 5 MINUTES AS NEEDED   [DISCONTINUED] phentermine 37.5 MG capsule Take 1 capsule (37.5 mg total) by mouth every morning.   [  DISCONTINUED] potassium chloride (MICRO-K) 10 MEQ CR capsule Take 1 capsule (10 mEq total) by mouth 3 (three) times a week. Mon/Wed/Fri--PLEASE CONTACT OFFICE FOR AN APPOINTMENT 1ST ATTEMPT    Allergies:   Patient has no known allergies.   Social History   Tobacco Use   Smoking status: Never   Smokeless tobacco: Never  Vaping Use   Vaping Use: Never used  Substance Use Topics   Alcohol use: Yes    Comment: occ   Drug use: No    Family Hx: The patient's family history includes  Cancer in her paternal grandmother; Hypertension in her mother. There is no history of Colon cancer, Colon polyps, Esophageal cancer, Rectal cancer, or Stomach cancer.  Review of Systems  Eyes:        +floaters  Genitourinary:  Positive for frequency.     EKGs/Labs/Other Test Reviewed:    EKG:  EKG is   ordered today.  The ekg ordered today demonstrates NSR, HR 77, leftward axis, increased artifact, no significant ST-T wave changes, QTc 414, similar to prior tracing  Recent Labs: No results found for requested labs within last 365 days.   Recent Lipid Panel No results for input(s): "CHOL", "TRIG", "HDL", "VLDL", "LDLCALC", "LDLDIRECT" in the last 8760 hours.   Risk Assessment/Calculations/Metrics:              Physical Exam:    VS:  BP 118/80   Pulse 77   Ht '5\' 8"'$  (1.727 m)   Wt 278 lb 6.4 oz (126.3 kg)   SpO2 96%   BMI 42.33 kg/m     Wt Readings from Last 3 Encounters:  10/23/21 278 lb 6.4 oz (126.3 kg)  10/22/21 279 lb (126.6 kg)  05/05/21 (!) 302 lb (137 kg)    Constitutional:      Appearance: Healthy appearance. Not in distress.  Neck:     Vascular: No JVR. JVD normal.  Pulmonary:     Effort: Pulmonary effort is normal.     Breath sounds: No wheezing. No rales.  Cardiovascular:     Normal rate. Regular rhythm. Normal S1. Normal S2.      Murmurs: There is no murmur.  Edema:    Peripheral edema absent.  Abdominal:     Palpations: Abdomen is soft.  Skin:    General: Skin is warm and dry.  Neurological:     Mental Status: Alert and oriented to person, place and time.         ASSESSMENT & PLAN:   Chronic heart failure with preserved ejection fraction (HCC) Echocardiogram in 2019 with normal EF and mild diastolic dysfunction. She is NYHA II. Volume status stable. She was given HCTZ instead of Lasix by primary care. She definitely had more brisk diuresis with Lasix. She has also developed some bladder issues since starting on HCTZ. DC HCTZ Resume Lasix 20  mg every MWF along with K+ 10 mEq  Request recent labs from PCP  Essential hypertension BP is controlled.  Continue diltiazem 120 mg daily, spironolactone 25 mg daily.  DC HCTZ and resume Lasix 20 mg every Monday Wednesday Friday as noted.  Pure hypercholesterolemia She is no longer on atorvastatin.  She did have recent lipids obtained from primary care.  I will obtain these.  If her LDL is above 100, we will resume atorvastatin.  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 Last echocardiogram in 2019 with EF 60-65.  Morbid obesity ([place]) She is on phentermine for weight loss. She  was given topiramate with it for weight loss as well. Phentermine is not a good drug to be on given her past cardiac hx. I recommend that she DC both of these drugs. I will refer her to our PharmD weight loss clinic to see if we can get her on semaglutide.   Precordial pain She has had occasional chest pain in the past. She takes NTG with relief. Her chest pain symptoms are somewhat rare. She had a normal cath when she presented with Tako tsubo CM. A Myoview in 2021 was normal. She may have an element of microvascular angina vs spasm. Continue diltiazem, NTG prn.            Dispo:  Return in about 6 months (around 04/25/2022) for Routine Follow Up, w/ Dr. Harrington Challenger, or Richardson Dopp, PA-C.   Medication Adjustments/Labs and Tests Ordered: Current medicines are reviewed at length with the patient today.  Concerns regarding medicines are outlined above.  Tests Ordered: Orders Placed This Encounter  Procedures   AMB Referral to [facility]   EKG 12-Lead   Medication Changes: Meds ordered this encounter  Medications   nitroGLYCERIN (NITROSTAT) 0.4 MG SL tablet    Sig: PLACE 1 TABLET (0.4 MG TOTAL) UNDER THE TONGUE EVERY 5 MINUTES AS NEEDED    Dispense:  25 tablet    Refill:  3   furosemide (LASIX) 20 MG tablet    Sig: Take 1 tablet (20 mg total) by mouth every Monday, Wednesday, and Friday.     Dispense:  36 tablet    Refill:  3   potassium chloride (KLOR-CON M) 10 MEQ tablet    Sig: Take 1 tablet (10 mEq total) by mouth every Monday, Wednesday, and Friday.    Dispense:  36 tablet    Refill:  3   Signed, Richardson Dopp, PA-C  10/23/2021 4:47 PM    [address], [place], [place]  [address] Phone: [phone]; Fax: [phone]

## 2021-10-23 NOTE — Assessment & Plan Note (Signed)
Echocardiogram in 2019 with normal EF and mild diastolic dysfunction. She is NYHA II. Volume status stable. She was given HCTZ instead of Lasix by primary care. She definitely had more brisk diuresis with Lasix. She has also developed some bladder issues since starting on HCTZ.  DC HCTZ  Resume Lasix 20 mg every MWF along with K+ 10 mEq   Request recent labs from PCP

## 2021-10-23 NOTE — Assessment & Plan Note (Signed)
BP is controlled.  Continue diltiazem 120 mg daily, spironolactone 25 mg daily.  DC HCTZ and resume Lasix 20 mg every Monday Wednesday Friday as noted.

## 2021-10-23 NOTE — Assessment & Plan Note (Signed)
She is no longer on atorvastatin.  She did have recent lipids obtained from primary care.  I will obtain these.  If her LDL is above 100, we will resume atorvastatin.

## 2021-10-24 LAB — URINE CULTURE

## 2021-11-15 ENCOUNTER — Ambulatory Visit: Payer: 59

## 2021-11-19 ENCOUNTER — Telehealth: Payer: Self-pay | Admitting: Physician Assistant

## 2021-11-19 ENCOUNTER — Ambulatory Visit: Payer: 59

## 2021-11-19 DIAGNOSIS — E7800 Pure hypercholesterolemia, unspecified: Secondary | ICD-10-CM

## 2021-11-19 NOTE — Telephone Encounter (Signed)
Labs from PCP personally reviewed and interpreted 09/28/2021: Total cholesterol 248, triglycerides 165, HDL 33, LDL 182, K+ 4.5, creatinine 1.1, ALT 15, hemoglobin 14.1  PLAN: Restart atorvastatin 40 mg daily Fasting lipids, LFTs in 3 months  Richardson Dopp, PA-C    11/19/2021 4:45 PM

## 2021-11-20 MED ORDER — ATORVASTATIN CALCIUM 40 MG PO TABS: 40.0000 mg | ORAL_TABLET | Freq: Every day | ORAL | 3 refills | Status: DC

## 2021-11-20 NOTE — Telephone Encounter (Signed)
Call placed to pt regarding message below. Pt will start Atorvastatin 40 mg daily and repeat labs 02/22/22.

## 2021-12-16 NOTE — Progress Notes (Unsigned)
Patient ID: Diana Campos                 DOB: [birth year]                    MRN: [id number]     HPI: Diana Campos is a 56 y.o. female patient referred to pharmacy clinic by Richardson Dopp to initiate weight loss therapy with GLP1-RA. PMH is significant for obesity, NSTEMI in 2019, Tako-Tsubo CM, daistolic HF, HTN, and anxiety. Most recent BMI 42.33 at visit with Richardson Dopp.  Pt reports to clinic today in good spirits. She expresses a desire to improve her heart health through weight management. She has tried phentermine in the past with some success, however, gained all weight lost back after discontinuation. She currently does not take any medications for weight loss. She denies any current exercise and endorses an unhealthy diet currently. She was informed that current GLP-1 agonists are on backorder until the end of the year. She was amenable to discussion regarding diet and exercise. She denies any family history of medullary thyroid cancer.    Current weight management medications: none  Previously tried meds: phentermine  Current meds that may affect weight: topiramate, amitryptyline  Baseline weight/BMI: 287.8 lbs/43.76 kg/m2  Insurance payor: [employer]  Diet:  -Breakfast: snacks such as chips -Lunch: snacks such as chips or sandwiches -Dinner: Fast food ([employer], [employer], etc) -Snacks: chips and sandwiches -Drinks: coffee w/ cream and sugar, sweet tea, strawberry lemonade  Exercise: Pt reported occasionally using her exercise hula hoop, box stepper, and treadmill prior to her brother passing, however, she has not had the motivation since then. She currently exercises none.  Family History:  Family History  Problem Relation Age of Onset   Hypertension Mother    Cancer Paternal Grandmother    Colon cancer Neg Hx    Colon polyps Neg Hx    Esophageal cancer Neg Hx    Rectal cancer Neg Hx    Stomach cancer Neg Hx      Social History:  Social History    Socioeconomic History   Marital status: Divorced    Spouse name: Not on file   Number of children: Not on file   Years of education: Not on file   Highest education level: Not on file  Occupational History   Not on file  Tobacco Use   Smoking status: Never   Smokeless tobacco: Never  Vaping Use   Vaping Use: Never used  Substance and Sexual Activity   Alcohol use: Yes    Comment: occ   Drug use: No   Sexual activity: Yes    Partners: Male  Other Topics Concern   Not on file  Social History Narrative   Not on file   Social Determinants of Health   Financial Resource Strain: Not on file  Food Insecurity: Not on file  Transportation Needs: Not on file  Physical Activity: Not on file  Stress: Not on file  Social Connections: Not on file  Intimate Partner Violence: Not on file     Labs: Lab Results  Component Value Date   HGBA1C 5.9 (H) 07/21/2014    Wt Readings from Last 1 Encounters:  10/23/21 278 lb 6.4 oz (126.3 kg)    BP Readings from Last 1 Encounters:  10/23/21 118/80   Pulse Readings from Last 1 Encounters:  10/23/21 77       Component Value Date/Time   CHOL 120 04/27/2018 0907  TRIG 61 04/27/2018 0907   HDL 34 (L) 04/27/2018 0907   CHOLHDL 3.5 04/27/2018 0907   CHOLHDL 5.9 02/25/2018 0532   VLDL 13 02/25/2018 0532   LDLCALC 74 04/27/2018 6295    Past Medical History:  Diagnosis Date   Acute heart failure (HCC)    Anemia    past hx before hysterectomy   Anxiety    Blood transfusion without reported diagnosis    Chest pain    Myoview 9/21: EF 68, no ischemia; low risk   Hypertension    NSTEMI (non-ST elevated myocardial infarction) ([place]) 02/26/2018   Takotsubo cardiomyopathy     Current Outpatient Medications on File Prior to Visit  Medication Sig Dispense Refill   amitriptyline (ELAVIL) 25 MG tablet Take 1 tablet (25 mg total) by mouth at bedtime. 90 tablet 3   aspirin 81 MG chewable tablet Chew 1 tablet (81 mg total) by mouth  daily.     atorvastatin (LIPITOR) 40 MG tablet Take 1 tablet (40 mg total) by mouth daily. 90 tablet 3   diltiazem (CARDIZEM CD) 120 MG 24 hr capsule Take 1 capsule (120 mg total) by mouth daily. Please make overdue appt with Dr. Harrington Challenger before anymore refills. Thank you 3rd and Final Attempt 15 capsule 0   furosemide (LASIX) 20 MG tablet Take 1 tablet (20 mg total) by mouth every Monday, Wednesday, and Friday. 36 tablet 3   nitroGLYCERIN (NITROSTAT) 0.4 MG SL tablet PLACE 1 TABLET (0.4 MG TOTAL) UNDER THE TONGUE EVERY 5 MINUTES AS NEEDED 25 tablet 3   potassium chloride (KLOR-CON M) 10 MEQ tablet Take 1 tablet (10 mEq total) by mouth every Monday, Wednesday, and Friday. 36 tablet 3   spironolactone (ALDACTONE) 25 MG tablet Take 1 tablet (25 mg total) by mouth daily. Please make overdue appt with Dr. Harrington Challenger before anymore refills. Thank you 2nd attempt 15 tablet 0   topiramate (TOPAMAX) 50 MG tablet Take 50 mg by mouth daily.     [DISCONTINUED] isosorbide mononitrate (IMDUR) 30 MG 24 hr tablet Take 0.5 tablets (15 mg total) by mouth daily. 90 tablet 1   No current facility-administered medications on file prior to visit.    No Known Allergies   Assessment/Plan:  1. Weight loss - Patient has not yet implemented lifestyle modifications to diet and exercise. Given GLP-1 receptor agonists for weight loss being on backorder, the focus of the visit was discussion of diet and exercise. The patient was counseled on implementing a heart healthy diet consistent with the mediterranean diet and the plate method. Resources were provided. She intends to work on limiting processed foods and fast foods, replacing them with fruits, veggies, whole grains, and lean meats in appropriate portions. She was also advised to decrease the amount of sugar in coffee, tea, and lemonade in small increments daily with the goal of eliminating sugar intake from drinks. Pt was also educated on proper diet and exercise, recommending  adherence to the [facility] recommendation for 150 minutes of moderate intensity exercise weekly. She has exercise equipment at home and was encouraged to incorporate activity throughout the day as she works from home. Pt was amenable to making these lifestyle changes and will each out in January to reevaluate GLP-1 agonist availability. She was educated on proper administration and storage, which will be reviewed if prescribed in the future.  Thank you for allowing pharmacy to participate in this patient's care.  Reatha Harps, PharmD PGY2 Pharmacy Resident 12/17/2021 4:07 PM Check [email or web address] for unit specific  pharmacy number

## 2021-12-17 ENCOUNTER — Ambulatory Visit: Payer: 59 | Attending: Internal Medicine | Admitting: Pharmacist

## 2022-02-22 ENCOUNTER — Ambulatory Visit: Payer: 59 | Attending: Physician Assistant

## 2022-02-22 DIAGNOSIS — E7800 Pure hypercholesterolemia, unspecified: Secondary | ICD-10-CM

## 2022-02-23 LAB — HEPATIC FUNCTION PANEL
ALT: 21 IU/L (ref 0–32)
AST: 23 IU/L (ref 0–40)
Albumin: 4 g/dL (ref 3.8–4.9)
Alkaline Phosphatase: 101 IU/L (ref 44–121)
Bilirubin Total: 0.5 mg/dL (ref 0.0–1.2)
Bilirubin, Direct: 0.18 mg/dL (ref 0.00–0.40)
Total Protein: 6.7 g/dL (ref 6.0–8.5)

## 2022-02-23 LAB — LIPID PANEL
Chol/HDL Ratio: 4 ratio (ref 0.0–4.4)
Cholesterol, Total: 137 mg/dL (ref 100–199)
HDL: 34 mg/dL — ABNORMAL LOW (ref >39)
LDL Chol Calc (NIH): 87 mg/dL (ref 0–99)
Triglycerides: 80 mg/dL (ref 0–149)
VLDL Cholesterol Cal: 16 mg/dL (ref 5–40)

## 2022-03-19 ENCOUNTER — Encounter: Payer: Self-pay | Admitting: Obstetrics

## 2022-03-19 ENCOUNTER — Ambulatory Visit (INDEPENDENT_AMBULATORY_CARE_PROVIDER_SITE_OTHER): Payer: 59 | Admitting: Obstetrics

## 2022-03-19 VITALS — BP 130/83 | HR 76 | Ht 68.0 in | Wt 302.0 lb

## 2022-03-19 DIAGNOSIS — N760 Acute vaginitis: Secondary | ICD-10-CM | POA: Diagnosis not present

## 2022-03-19 DIAGNOSIS — E2839 Other primary ovarian failure: Secondary | ICD-10-CM

## 2022-03-19 DIAGNOSIS — F419 Anxiety disorder, unspecified: Secondary | ICD-10-CM | POA: Diagnosis not present

## 2022-03-19 DIAGNOSIS — Z6841 Body Mass Index (BMI) 40.0 and over, adult: Secondary | ICD-10-CM

## 2022-03-19 DIAGNOSIS — N951 Menopausal and female climacteric states: Secondary | ICD-10-CM | POA: Diagnosis not present

## 2022-03-19 DIAGNOSIS — M7918 Myalgia, other site: Secondary | ICD-10-CM

## 2022-03-19 DIAGNOSIS — B9689 Other specified bacterial agents as the cause of diseases classified elsewhere: Secondary | ICD-10-CM

## 2022-03-19 DIAGNOSIS — Z01419 Encounter for gynecological examination (general) (routine) without abnormal findings: Secondary | ICD-10-CM | POA: Diagnosis not present

## 2022-03-19 DIAGNOSIS — Z1231 Encounter for screening mammogram for malignant neoplasm of breast: Secondary | ICD-10-CM

## 2022-03-19 DIAGNOSIS — F32A Depression, unspecified: Secondary | ICD-10-CM

## 2022-03-19 MED ORDER — IBUPROFEN 800 MG PO TABS: 800.0000 mg | ORAL_TABLET | Freq: Three times a day (TID) | ORAL | 5 refills | Status: DC | PRN

## 2022-03-19 MED ORDER — MELATONIN 10 MG PO TABS: 1.0000 | ORAL_TABLET | Freq: Every day | ORAL | 99 refills | Status: DC

## 2022-03-19 MED ORDER — METRONIDAZOLE 0.75 % VA GEL: 1.0000 | Freq: Two times a day (BID) | VAGINAL | 2 refills | Status: DC

## 2022-03-19 MED ORDER — ALPRAZOLAM 0.25 MG PO TABS: 0.2500 mg | ORAL_TABLET | Freq: Two times a day (BID) | ORAL | 2 refills | Status: AC

## 2022-03-19 MED ORDER — ESTRADIOL 0.1 MG/24HR TD PTWK: 0.1000 mg | MEDICATED_PATCH | TRANSDERMAL | 12 refills | Status: DC

## 2022-03-19 NOTE — Progress Notes (Signed)
Pt reports that she is experiencing insomnia, hot flashes, mood swings.  Reports that symptoms started in June Hx of BTL and partial hysterectomy

## 2022-03-19 NOTE — Progress Notes (Signed)
Subjective:        Diana Campos is a 56 y.o. female here for a routine exam.  Current complaints: Severe hot flashes, insomnia, and anxiety.  She is S/P  a supracervical hysterectomy, followed by a trachelectomy for excessive cervical discharge and spotting. Started experiencing severe hot flashes over the past several ,months.  Personal health questionnaire:  Is patient Ashkenazi Jewish, have a family history of breast and/or ovarian cancer: no Is there a family history of uterine cancer diagnosed at age < 1, gastrointestinal cancer, urinary tract cancer, family member who is a Field seismologist syndrome-associated carrier: no Is the patient overweight and hypertensive, family history of diabetes, personal history of gestational diabetes, preeclampsia or PCOS: no Is patient over 69, have PCOS,  family history of premature CHD under age 63, diabetes, smoke, have hypertension or peripheral artery disease:  no At any time, has a partner hit, kicked or otherwise hurt or frightened you?: no Over the past 2 weeks, have you felt down, depressed or hopeless?: no Over the past 2 weeks, have you felt little interest or pleasure in doing things?:no   Gynecologic History No LMP recorded. Patient has had a hysterectomy. Contraception: status post hysterectomy Last Pap: ~ 2014. Results were: normal Last mammogram: 2021. Results were: normal  Obstetric History OB History  Gravida Para Term Preterm AB Living  '4 3     1 3  '$ SAB IAB Ectopic Multiple Live Births    1     3    # Outcome Date GA Lbr Len/2nd Weight Sex Delivery Anes PTL Lv  4 Para 12/27/88    M Vag-Spont   LIV  3 Para 09/19/86    M Vag-Spont   LIV  2 IAB 04/08/82          1 Para 02/06/82    F Vag-Spont   LIV    Past Medical History:  Diagnosis Date   Acute heart failure (HCC)    Anemia    past hx before hysterectomy   Anxiety    Blood transfusion without reported diagnosis    Chest pain    Myoview 9/21: EF 68, no ischemia; low risk    Hypertension    NSTEMI (non-ST elevated myocardial infarction) ([place]) 02/26/2018   Takotsubo cardiomyopathy     Past Surgical History:  Procedure Laterality Date   ABDOMINAL HYSTERECTOMY     BACK SURGERY     CARDIAC CATHETERIZATION  02/17/2018   COLONOSCOPY     ? 2007- normal per pt- done due to eating lead and nacho chips    COLPORRHAPHY  10/24/2014   Posterior, with uterosacral ligament suspension   LEFT HEART CATH AND CORONARY ANGIOGRAPHY N/A 02/25/2018   Procedure: LEFT HEART CATH AND CORONARY ANGIOGRAPHY;  Surgeon: Belva Crome, MD;  Location: [facility];  Service: Cardiovascular;  Laterality: N/A;   TRACHELECTOMY  10/24/2014   TUBAL LIGATION     ULTRASOUND GUIDANCE FOR VASCULAR ACCESS  02/25/2018   Procedure: Ultrasound Guidance For Vascular Access;  Surgeon: Belva Crome, MD;  Location: [facility];  Service: Cardiovascular;;     Current Outpatient Medications:    ALPRAZolam (XANAX) 0.25 MG tablet, Take 1 tablet (0.25 mg total) by mouth 2 (two) times daily., Disp: 60 tablet, Rfl: 2   amitriptyline (ELAVIL) 25 MG tablet, Take 1 tablet (25 mg total) by mouth at bedtime., Disp: 90 tablet, Rfl: 3   aspirin 81 MG chewable tablet, Chew 1 tablet (81 mg  total) by mouth daily., Disp: , Rfl:    atorvastatin (LIPITOR) 40 MG tablet, Take 1 tablet (40 mg total) by mouth daily., Disp: 90 tablet, Rfl: 3   diltiazem (CARDIZEM CD) 120 MG 24 hr capsule, Take 1 capsule (120 mg total) by mouth daily. Please make overdue appt with Dr. Harrington Challenger before anymore refills. Thank you 3rd and Final Attempt, Disp: 15 capsule, Rfl: 0   estradiol (CLIMARA) 0.1 mg/24hr patch, Place 1 patch (0.1 mg total) onto the skin once a week., Disp: 4 patch, Rfl: 12   furosemide (LASIX) 20 MG tablet, Take 1 tablet (20 mg total) by mouth every Monday, Wednesday, and Friday., Disp: 36 tablet, Rfl: 3   ibuprofen (ADVIL) 800 MG tablet, Take 1 tablet (800 mg total) by mouth every 8 (eight) hours as needed., Disp:  30 tablet, Rfl: 5   metroNIDAZOLE (METROGEL) 0.75 % vaginal gel, Place 1 Applicatorful vaginally 2 (two) times daily., Disp: 70 g, Rfl: 2   nitroGLYCERIN (NITROSTAT) 0.4 MG SL tablet, PLACE 1 TABLET (0.4 MG TOTAL) UNDER THE TONGUE EVERY 5 MINUTES AS NEEDED, Disp: 25 tablet, Rfl: 3   potassium chloride (KLOR-CON M) 10 MEQ tablet, Take 1 tablet (10 mEq total) by mouth every Monday, Wednesday, and Friday., Disp: 36 tablet, Rfl: 3   spironolactone (ALDACTONE) 25 MG tablet, Take 1 tablet (25 mg total) by mouth daily. Please make overdue appt with Dr. Harrington Challenger before anymore refills. Thank you 2nd attempt, Disp: 15 tablet, Rfl: 0   topiramate (TOPAMAX) 50 MG tablet, Take 50 mg by mouth daily. (Patient not taking: Reported on 03/19/2022), Disp: , Rfl:  No Known Allergies  Social History   Tobacco Use   Smoking status: Never   Smokeless tobacco: Never  Substance Use Topics   Alcohol use: Yes    Comment: occ    Family History  Problem Relation Age of Onset   Hypertension Mother    Cancer Paternal Grandmother    Colon cancer Neg Hx    Colon polyps Neg Hx    Esophageal cancer Neg Hx    Rectal cancer Neg Hx    Stomach cancer Neg Hx       Review of Systems  Constitutional: negative for fatigue and weight loss Respiratory: negative for cough and wheezing Cardiovascular: negative for chest pain, fatigue and palpitations Gastrointestinal: negative for abdominal pain and change in bowel habits Musculoskeletal:negative for myalgias Neurological: negative for gait problems and tremors Behavioral/Psych:  positive for insomnia.negative for abusive relationship, depression Endocrine: negative for temperature intolerance    Genitourinary: positive for severe hot flashes.  negative for abnormal menstrual periods, genital lesions,sexual problems and vaginal discharge Integument/breast: negative for breast lump, breast tenderness, nipple discharge and skin lesion(s)    Objective:       BP 130/83    Pulse 76   Ht '5\' 8"'$  (1.727 m)   Wt (!) 302 lb (137 kg)   BMI 45.92 kg/m  General:   Alert and no distress  Skin:   no rash or abnormalities  Lungs:   clear to auscultation bilaterally  Heart:   regular rate and rhythm, S1, S2 normal, no murmur, click, rub or gallop  Breasts:   normal without suspicious masses, skin or nipple changes or axillary nodes  Abdomen:  normal findings: no organomegaly, soft, non-tender and no hernia  Pelvis:  External genitalia: normal general appearance Urinary system: urethral meatus normal and bladder without fullness, nontender Vaginal: normal without tenderness, induration or masses Cervix: absent Adnexa: normal  bimanual exam Uterus: absent   Lab Review Urine pregnancy test Labs reviewed yes Radiologic studies reviewed yes  /I have spent a total of 20 minutes of face-to-face time, excluding clinical staff time, reviewing notes and preparing to see patient, ordering tests and/or medications, and counseling the patient.   Assessment:    1. Encounter for annual routine gynecological examination  2. BV (bacterial vaginosis) Rx: - metroNIDAZOLE (METROGEL) 0.75 % vaginal gel; Place 1 Applicatorful vaginally 2 (two) times daily.  Dispense: 70 g; Refill: 2  3. Hot flashes due to menopause Rx: - estradiol (CLIMARA) 0.1 mg/24hr patch; Place 1 patch (0.1 mg total) onto the skin once a week.  Dispense: 4 patch; Refill: 12  4. Insomnia associated with menopause -- Melatonin recommended  5. Anxiety and depression Rx: - ALPRAZolam (XANAX) 0.25 MG tablet; Take 1 tablet (0.25 mg total) by mouth 2 (two) times daily.  Dispense: 60 tablet; Refill: 2  6. Myalgia, multiple sites Rx: - ibuprofen (ADVIL) 800 MG tablet; Take 1 tablet (800 mg total) by mouth every 8 (eight) hours as needed.  Dispense: 30 tablet; Refill: 5  7. Class 3 severe obesity without serious comorbidity with body mass index (BMI) of 45.0 to 49.9 in adult, unspecified obesity type ([place]) -  weight reduction recommended  8. Breast cancer screening by mammogram Rx: - MM 3D SCREEN BREAST BILATERAL; Future   9. Hypoestrogenemia Rx: - DG Bone Density ( DXA )    Plan:    Education reviewed: calcium supplements, depression evaluation, low fat, low cholesterol diet, safe sex/STD prevention, self breast exams, and weight bearing exercise. Hormone replacement therapy: hormone replacement therapy: Climara 0.1 mg / 24 hour patch.  Apply weekly as directrd., risks and benefits reviewed, prescription for 12 months, and follow up appointment recommended. Mammogram ordered. Follow up in: 2 months. Bone Density Study ordered    Meds ordered this encounter  Medications   estradiol (CLIMARA) 0.1 mg/24hr patch    Sig: Place 1 patch (0.1 mg total) onto the skin once a week.    Dispense:  4 patch    Refill:  12   ALPRAZolam (XANAX) 0.25 MG tablet    Sig: Take 1 tablet (0.25 mg total) by mouth 2 (two) times daily.    Dispense:  60 tablet    Refill:  2   metroNIDAZOLE (METROGEL) 0.75 % vaginal gel    Sig: Place 1 Applicatorful vaginally 2 (two) times daily.    Dispense:  70 g    Refill:  2   ibuprofen (ADVIL) 800 MG tablet    Sig: Take 1 tablet (800 mg total) by mouth every 8 (eight) hours as needed.    Dispense:  30 tablet    Refill:  5   Orders Placed This Encounter  Procedures   MM 3D SCREEN BREAST BILATERAL    INS:[employer] PF:10/22/2019 '@BCG'$ / NO PROBLEMS/ NO NEEDS/ NO BREAST SX'S/TOMO PT AWARE OF $75 NO SHOW FRR SW KIM '@FEMINA'$  NM    Standing Status:   Future    Standing Expiration Date:   03/20/2023    Order Specific Question:   Reason for Exam (SYMPTOM  OR DIAGNOSIS REQUIRED)    Answer:   Screening    Order Specific Question:   Is the patient pregnant?    Answer:   No    Order Specific Question:   Preferred imaging location?    Answer:   [place]. Jodi Mourning MD 03/19/2022

## 2022-05-02 MED ORDER — ZEPBOUND 2.5 MG/0.5ML ~~LOC~~ SOAJ: 2.5000 mg | SUBCUTANEOUS | 0 refills | Status: DC

## 2022-05-08 IMAGING — MG DIGITAL SCREENING BILAT W/ CAD
4 series · 4 of 4 positions shown · non-contrast
Comparison: Previous exam(s).

CLINICAL DATA: Screening.

EXAM:
DIGITAL SCREENING BILATERAL MAMMOGRAM WITH CAD

[L CC]
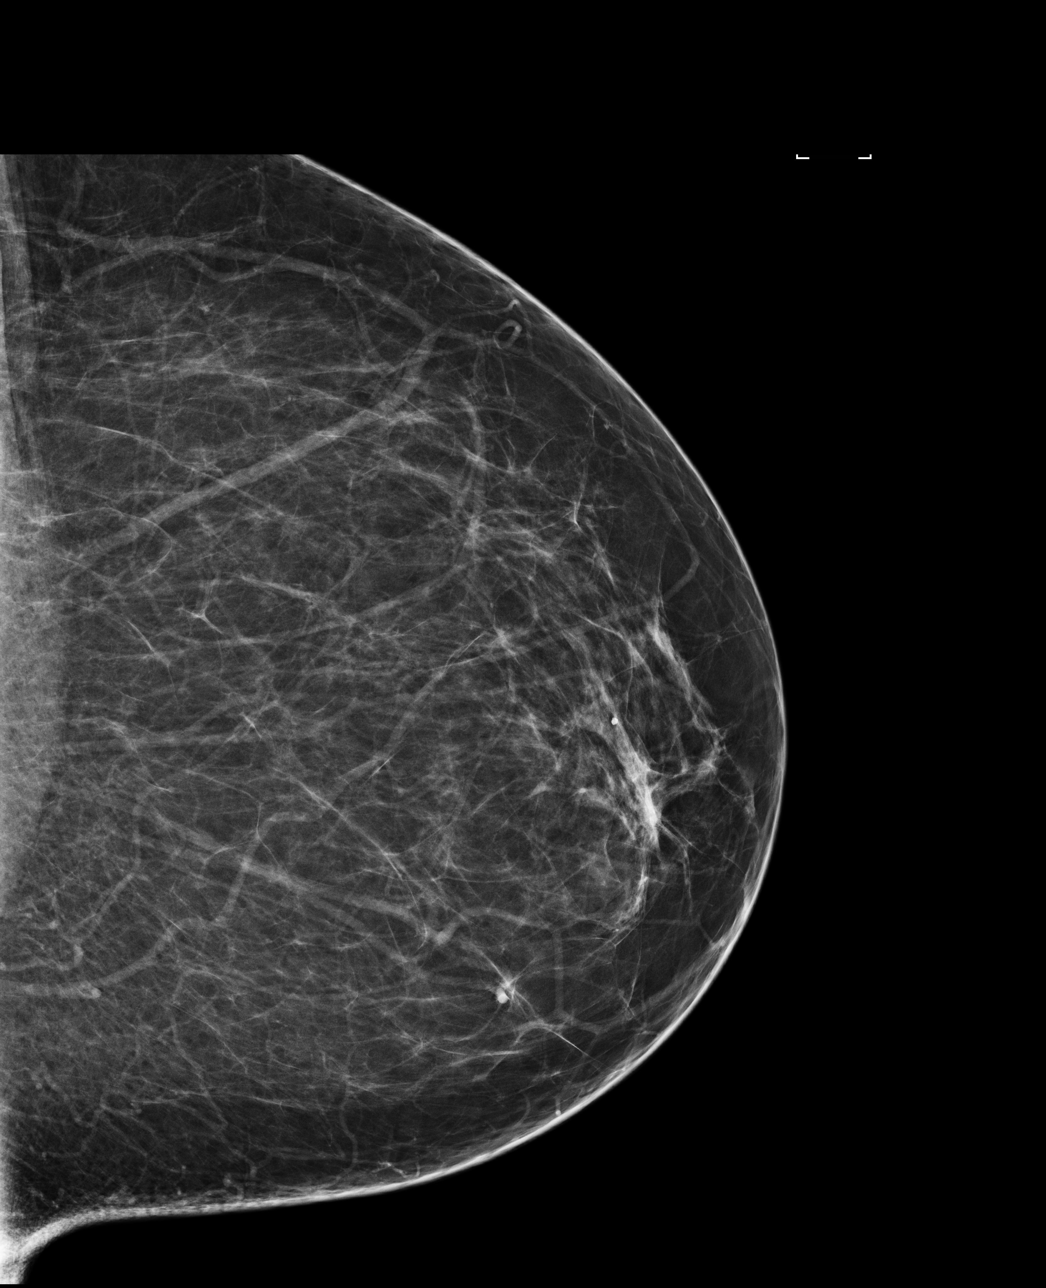

[R CC]
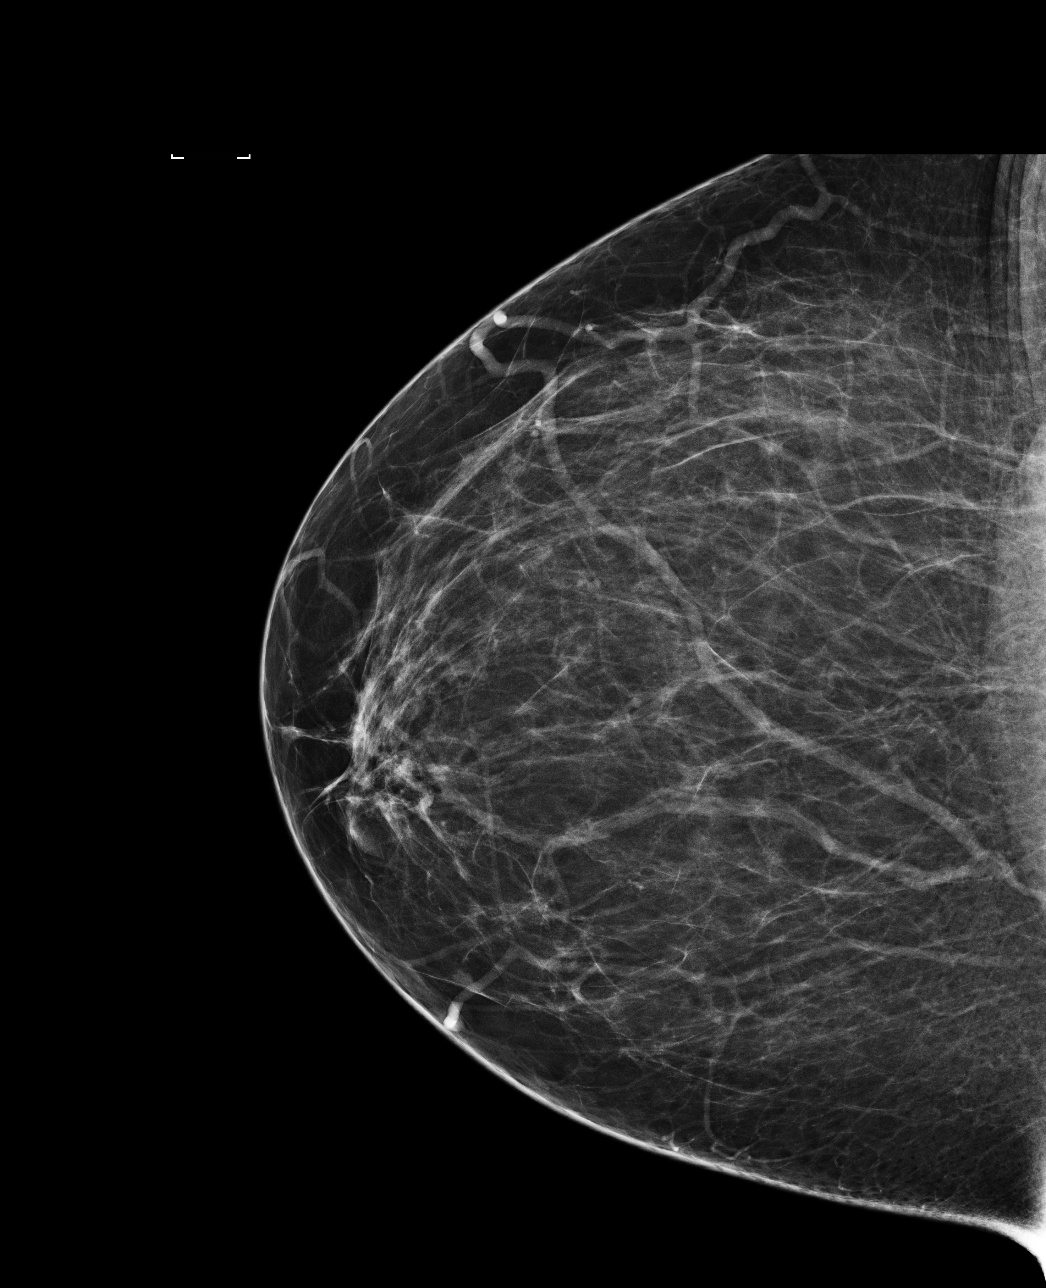

[R MLO]
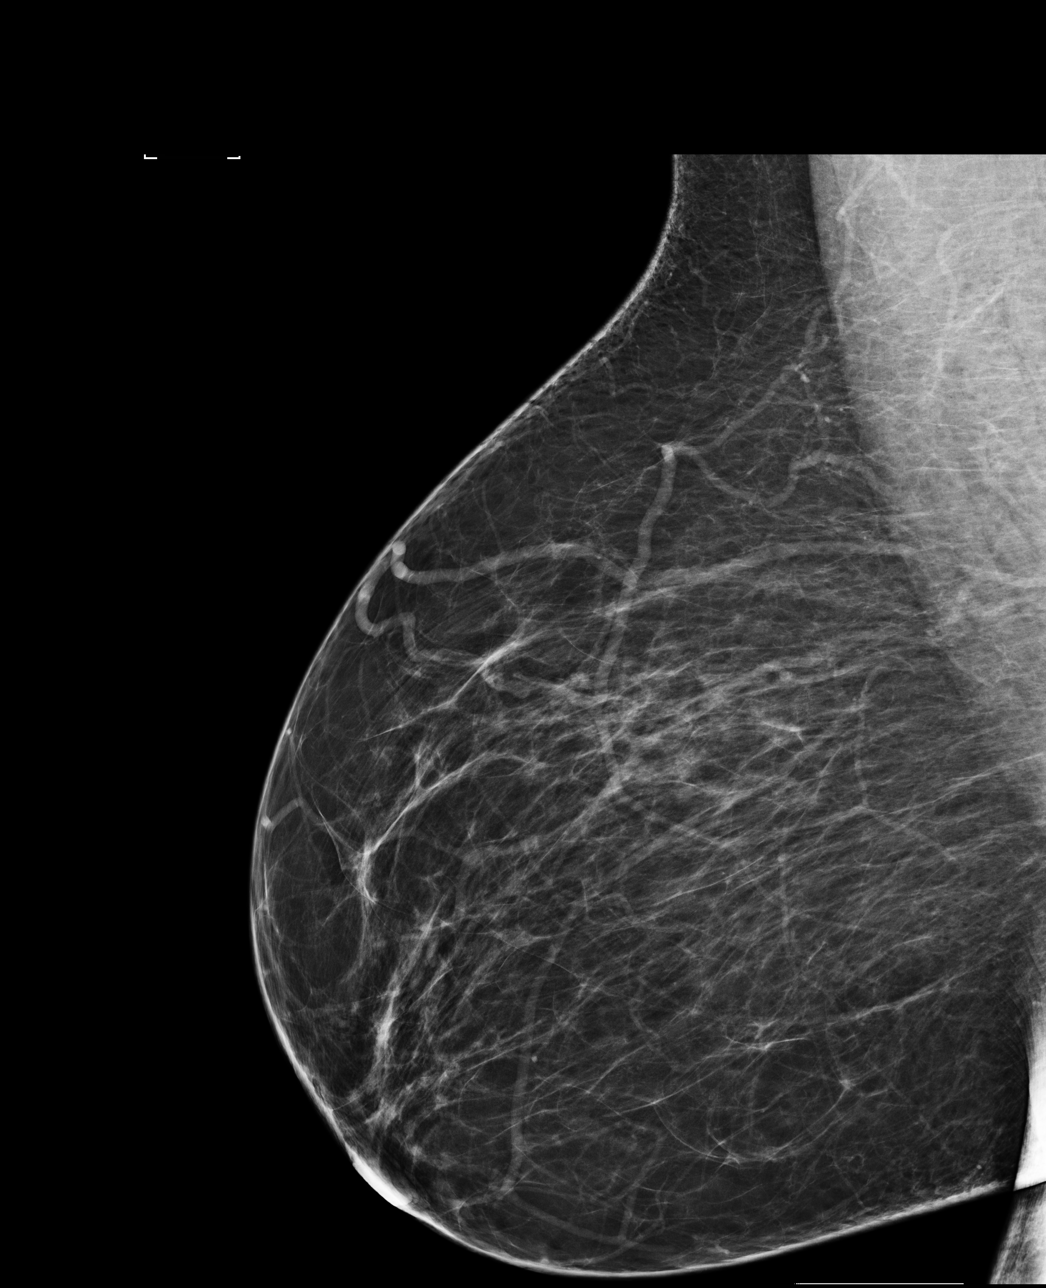

[L MLO]
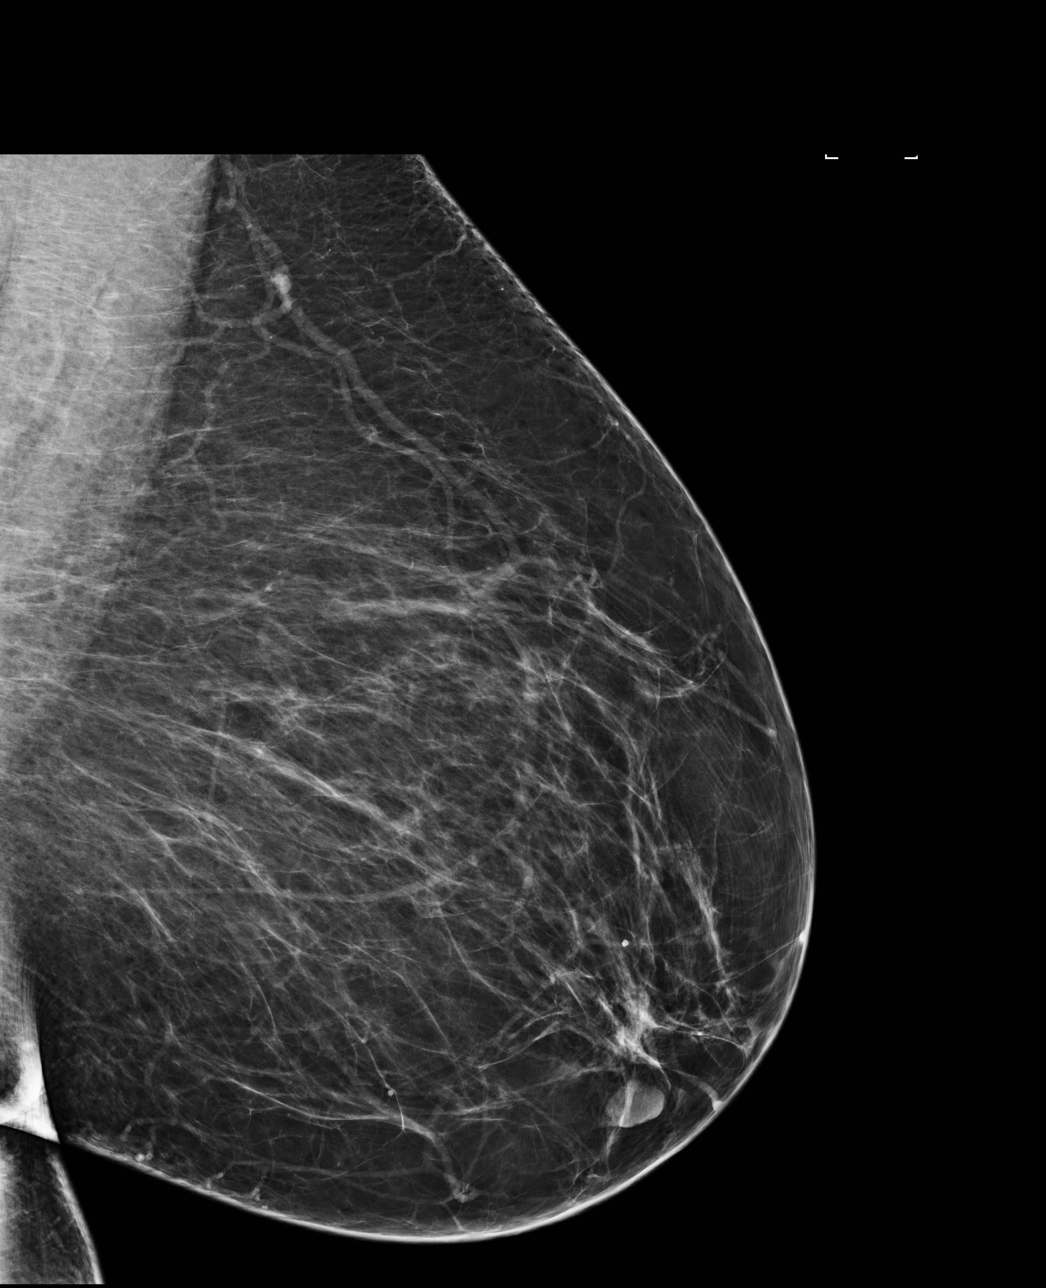

[4 of 4 positions shown; findings below may reference images not displayed]

ACR Breast Density Category b: There are scattered areas of
fibroglandular density.
FINDINGS: There are no findings suspicious for malignancy. Images were
processed with CAD.
IMPRESSION: No mammographic evidence of malignancy. A result letter of this
screening mammogram will be mailed directly to the patient.

RECOMMENDATION:
Screening mammogram in one year. (Code:AS-G-LCT)

BI-RADS CATEGORY  1: Negative.

## 2022-05-13 ENCOUNTER — Ambulatory Visit
Admission: RE | Admit: 2022-05-13 | Discharge: 2022-05-13 | Disposition: A | Discharge status: Home / Self Care | Payer: 59 | Source: Ambulatory Visit | Attending: Obstetrics | Admitting: Obstetrics

## 2022-05-13 DIAGNOSIS — Z1231 Encounter for screening mammogram for malignant neoplasm of breast: Secondary | ICD-10-CM

## 2022-05-21 ENCOUNTER — Ambulatory Visit (INDEPENDENT_AMBULATORY_CARE_PROVIDER_SITE_OTHER): Payer: 59 | Admitting: Obstetrics

## 2022-05-21 ENCOUNTER — Encounter: Payer: Self-pay | Admitting: Obstetrics

## 2022-05-21 VITALS — BP 122/83 | HR 75 | Wt 307.8 lb

## 2022-05-21 DIAGNOSIS — Z6841 Body Mass Index (BMI) 40.0 and over, adult: Secondary | ICD-10-CM

## 2022-05-21 DIAGNOSIS — F32A Depression, unspecified: Secondary | ICD-10-CM

## 2022-05-21 DIAGNOSIS — N951 Menopausal and female climacteric states: Secondary | ICD-10-CM

## 2022-05-21 DIAGNOSIS — F419 Anxiety disorder, unspecified: Secondary | ICD-10-CM

## 2022-05-21 NOTE — Progress Notes (Signed)
Patient ID: Diana Campos, female   DOB: 1966-02-18, 57 y.o.   MRN: [id number]  HPI Diana Campos is a 57 y.o. female.  Presents for follow up after starting HRT for severe hot flashes.  She is s/p hysterectomy. HPI  Past Medical History:  Diagnosis Date   Acute heart failure ([place])    Anemia    past hx before hysterectomy   Anxiety    Blood transfusion without reported diagnosis    Chest pain    Myoview 9/21: EF 68, no ischemia; low risk   Hypertension    NSTEMI (non-ST elevated myocardial infarction) ([place]) 02/26/2018   Takotsubo cardiomyopathy     Past Surgical History:  Procedure Laterality Date   ABDOMINAL HYSTERECTOMY     BACK SURGERY     CARDIAC CATHETERIZATION  02/17/2018   COLONOSCOPY     ? 2007- normal per pt- done due to eating lead and nacho chips    COLPORRHAPHY  10/24/2014   Posterior, with uterosacral ligament suspension   LEFT HEART CATH AND CORONARY ANGIOGRAPHY N/A 02/25/2018   Procedure: LEFT HEART CATH AND CORONARY ANGIOGRAPHY;  Surgeon: Belva Crome, MD;  Location: [facility];  Service: Cardiovascular;  Laterality: N/A;   TRACHELECTOMY  10/24/2014   TUBAL LIGATION     ULTRASOUND GUIDANCE FOR VASCULAR ACCESS  02/25/2018   Procedure: Ultrasound Guidance For Vascular Access;  Surgeon: Belva Crome, MD;  Location: [facility];  Service: Cardiovascular;;    Family History  Problem Relation Age of Onset   Hypertension Mother    Cancer Paternal Grandmother    Colon cancer Neg Hx    Colon polyps Neg Hx    Esophageal cancer Neg Hx    Rectal cancer Neg Hx    Stomach cancer Neg Hx     Social History Social History   Tobacco Use   Smoking status: Never    Passive exposure: Never   Smokeless tobacco: Never  Vaping Use   Vaping Use: Never used  Substance Use Topics   Alcohol use: Yes    Comment: occ   Drug use: No    No Known Allergies  Current Outpatient Medications  Medication Sig Dispense Refill   ALPRAZolam (XANAX) 0.25 MG  tablet Take 1 tablet (0.25 mg total) by mouth 2 (two) times daily. 60 tablet 2   amitriptyline (ELAVIL) 25 MG tablet Take 1 tablet (25 mg total) by mouth at bedtime. 90 tablet 3   aspirin 81 MG chewable tablet Chew 1 tablet (81 mg total) by mouth daily.     atorvastatin (LIPITOR) 40 MG tablet Take 1 tablet (40 mg total) by mouth daily. 90 tablet 3   diltiazem (CARDIZEM CD) 120 MG 24 hr capsule Take 1 capsule (120 mg total) by mouth daily. Please make overdue appt with Dr. Harrington Challenger before anymore refills. Thank you 3rd and Final Attempt 15 capsule 0   estradiol (CLIMARA) 0.1 mg/24hr patch Place 1 patch (0.1 mg total) onto the skin once a week. 4 patch 12   furosemide (LASIX) 20 MG tablet Take 1 tablet (20 mg total) by mouth every Monday, Wednesday, and Friday. 36 tablet 3   ibuprofen (ADVIL) 800 MG tablet Take 1 tablet (800 mg total) by mouth every 8 (eight) hours as needed. 30 tablet 5   Melatonin 10 MG TABS Take 1 tablet by mouth at bedtime. 100 tablet PRN   metroNIDAZOLE (METROGEL) 0.75 % vaginal gel Place 1 Applicatorful vaginally 2 (two) times daily.  70 g 2   nitroGLYCERIN (NITROSTAT) 0.4 MG SL tablet PLACE 1 TABLET (0.4 MG TOTAL) UNDER THE TONGUE EVERY 5 MINUTES AS NEEDED 25 tablet 3   potassium chloride (KLOR-CON M) 10 MEQ tablet Take 1 tablet (10 mEq total) by mouth every Monday, Wednesday, and Friday. 36 tablet 3   spironolactone (ALDACTONE) 25 MG tablet Take 1 tablet (25 mg total) by mouth daily. Please make overdue appt with Dr. Harrington Challenger before anymore refills. Thank you 2nd attempt 15 tablet 0   tirzepatide (ZEPBOUND) 2.5 MG/0.5ML Pen Inject 2.5 mg into the skin once a week. 2 mL 0   topiramate (TOPAMAX) 50 MG tablet Take 50 mg by mouth daily. (Patient not taking: Reported on 03/19/2022)     No current facility-administered medications for this visit.    Review of Systems Review of Systems Constitutional: negative for fatigue and weight loss Respiratory: negative for cough and  wheezing Cardiovascular: negative for chest pain, fatigue and palpitations Gastrointestinal: negative for abdominal pain and change in bowel habits Genitourinary: positive for severe hot flashes Integument/breast: negative for nipple discharge Musculoskeletal:negative for myalgias Neurological: negative for gait problems and tremors Behavioral/Psych: negative for abusive relationship, depression Endocrine: negative for temperature intolerance      Blood pressure 122/83, pulse 75, weight (!) 307 lb 12.8 oz (139.6 kg).  Physical Exam Physical Exam General:   alert  Skin:   no rash or abnormalities  Lungs:   clear to auscultation bilaterally  Heart:   regular rate and rhythm, S1, S2 normal, no murmur, click, rub or gallop  Pelvic Exam:  Deferred  I have spent a total of 15 minutes of face-to-face time, excluding clinical staff time, reviewing notes and preparing to see patient, ordering tests and/or medications, and counseling the patient.   Data Reviewed Labs  Assessment     1. Hot flashes due to menopause - much improved after starting Climara  2. Anxiety and depression - clinically stable  3. Class 3 severe obesity without serious comorbidity with body mass index (BMI) of 45.0 to 49.9 in adult, unspecified obesity type Diana Campos) - considering Bariatric Surgery    Plan   Follow up prn  Shelly Bombard, MD 05/21/2022 4:40 PM

## 2022-05-21 NOTE — Progress Notes (Signed)
Pt presents to folllow up hot flashes and Climara.

## 2022-05-23 NOTE — Addendum Note (Signed)
Addended by: Marcelle Overlie D on: 05/23/2022 01:30 PM   Modules accepted: Orders

## 2022-05-28 ENCOUNTER — Encounter (HOSPITAL_COMMUNITY): Payer: Self-pay

## 2022-05-28 ENCOUNTER — Emergency Department (HOSPITAL_COMMUNITY)
Admission: EM | Admit: 2022-05-28 | Discharge: 2022-05-28 | Disposition: A | Discharge status: Home / Self Care | Payer: 59 | Attending: Emergency Medicine | Admitting: Emergency Medicine

## 2022-05-28 ENCOUNTER — Other Ambulatory Visit: Payer: Self-pay

## 2022-05-28 ENCOUNTER — Emergency Department (HOSPITAL_COMMUNITY): Payer: 59

## 2022-05-28 DIAGNOSIS — R61 Generalized hyperhidrosis: Secondary | ICD-10-CM | POA: Diagnosis not present

## 2022-05-28 DIAGNOSIS — Z79899 Other long term (current) drug therapy: Secondary | ICD-10-CM | POA: Diagnosis not present

## 2022-05-28 DIAGNOSIS — R0602 Shortness of breath: Secondary | ICD-10-CM | POA: Diagnosis not present

## 2022-05-28 DIAGNOSIS — R0789 Other chest pain: Secondary | ICD-10-CM | POA: Diagnosis not present

## 2022-05-28 DIAGNOSIS — R079 Chest pain, unspecified: Secondary | ICD-10-CM

## 2022-05-28 DIAGNOSIS — I10 Essential (primary) hypertension: Secondary | ICD-10-CM | POA: Insufficient documentation

## 2022-05-28 DIAGNOSIS — Z7982 Long term (current) use of aspirin: Secondary | ICD-10-CM | POA: Insufficient documentation

## 2022-05-28 LAB — COMPREHENSIVE METABOLIC PANEL
ALT: 13 U/L (ref 0–44)
AST: 18 U/L (ref 15–41)
Albumin: 3.6 g/dL (ref 3.5–5.0)
Alkaline Phosphatase: 68 U/L (ref 38–126)
Anion gap: 8 (ref 5–15)
BUN: 12 mg/dL (ref 6–20)
CO2: 24 mmol/L (ref 22–32)
Calcium: 9.1 mg/dL (ref 8.9–10.3)
Chloride: 102 mmol/L (ref 98–111)
Creatinine, Ser: 0.98 mg/dL (ref 0.44–1.00)
GFR, Estimated: >60 mL/min (ref >60)
Glucose, Bld: 124 mg/dL — ABNORMAL HIGH (ref 70–99)
Potassium: 4 mmol/L (ref 3.5–5.1)
Sodium: 134 mmol/L — ABNORMAL LOW (ref 135–145)
Total Bilirubin: 0.4 mg/dL (ref 0.3–1.2)
Total Protein: 7.4 g/dL (ref 6.5–8.1)

## 2022-05-28 LAB — CBC
HCT: 43.5 % (ref 36.0–46.0)
Hemoglobin: 14 g/dL (ref 12.0–15.0)
MCH: 29.5 pg (ref 26.0–34.0)
MCHC: 32.2 g/dL (ref 30.0–36.0)
MCV: 91.6 fL (ref 80.0–100.0)
Platelets: 310 10*3/uL (ref 150–400)
RBC: 4.75 MIL/uL (ref 3.87–5.11)
RDW: 13.9 % (ref 11.5–15.5)
WBC: 8.8 10*3/uL (ref 4.0–10.5)
nRBC: 0 % (ref 0.0–0.2)

## 2022-05-28 LAB — TROPONIN I (HIGH SENSITIVITY)
Troponin I (High Sensitivity): 5 ng/L (ref <18)
Troponin I (High Sensitivity): 5 ng/L (ref <18)

## 2022-05-28 LAB — I-STAT BETA HCG BLOOD, ED (MC, WL, AP ONLY): I-stat hCG, quantitative: <5 m[IU]/mL (ref <5)

## 2022-05-28 LAB — D-DIMER, QUANTITATIVE: D-Dimer, Quant: <0.27 ug/mL-FEU (ref 0.00–0.50)

## 2022-05-28 NOTE — Telephone Encounter (Signed)
Pt is currently being seen in the ED.

## 2022-05-28 NOTE — ED Provider Notes (Signed)
[facility] Provider Note   CSN: [id number] Arrival date & time: 05/28/22  1022     History  Chief Complaint  Patient presents with   Chest Pain    Andreya Wingrove Mccullum is a 57 y.o. female.  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  Patient reports that she started having sharp chest pains this morning around 7 AM.  Describes them as nonpleuritic.  Nonexertional.  Says that they were left-sided and radiated up to her jaw.  Also had associated shortness of breath with diaphoresis.  No vomiting.  Took several nitroglycerin with improvement of her chest discomfort.  Has not yet taken aspirin today.  Says that she had an NSTEMI due to her Takotsubo cardiomyopathy but has no stents in her heart.  Says that she has been under significant amount of stress due to a difficult family situation recently. No smoking history. No family hx of MI.        Home Medications Prior to Admission medications   Medication Sig Start Date End Date Taking? Authorizing Provider  ALPRAZolam (XANAX) 0.25 MG tablet Take 1 tablet (0.25 mg total) by mouth 2 (two) times daily. 03/19/22   Shelly Bombard, MD  amitriptyline (ELAVIL) 25 MG tablet Take 1 tablet (25 mg total) by mouth at bedtime. 10/22/21   Anyanwu, Sallyanne Havers, MD  aspirin 81 MG chewable tablet Chew 1 tablet (81 mg total) by mouth daily. 03/01/18   Kroeger, Lorelee Cover., PA-C  atorvastatin (LIPITOR) 40 MG tablet Take 1 tablet (40 mg total) by mouth daily. 11/20/21   Richardson Dopp T, PA-C  diltiazem (CARDIZEM CD) 120 MG 24 hr capsule Take 1 capsule (120 mg total) by mouth daily. Please make overdue appt with Dr. Harrington Challenger before anymore refills. Thank you 3rd and Final Attempt 04/16/21   Fay Records, MD  estradiol [facility]) 0.1 mg/24hr patch Place 1 patch (0.1 mg total) onto the skin once a week.  03/19/22   Shelly Bombard, MD  furosemide (LASIX) 20 MG tablet Take 1 tablet (20 mg total) by mouth every Monday, Wednesday, and Friday. 10/24/21   Richardson Dopp T, PA-C  ibuprofen (ADVIL) 800 MG tablet Take 1 tablet (800 mg total) by mouth every 8 (eight) hours as needed. 03/19/22   Shelly Bombard, MD  Melatonin 10 MG TABS Take 1 tablet by mouth at bedtime. 03/19/22   Shelly Bombard, MD  metroNIDAZOLE (METROGEL) 0.75 % vaginal gel Place 1 Applicatorful vaginally 2 (two) times daily. 03/19/22   Shelly Bombard, MD  nitroGLYCERIN (NITROSTAT) 0.4 MG SL tablet PLACE 1 TABLET (0.4 MG TOTAL) UNDER THE TONGUE EVERY 5 MINUTES AS NEEDED 10/23/21   Richardson Dopp T, PA-C  potassium chloride (KLOR-CON M) 10 MEQ tablet Take 1 tablet (10 mEq total) by mouth every Monday, Wednesday, and Friday. 10/24/21   Richardson Dopp T, PA-C  spironolactone (ALDACTONE) 25 MG tablet Take 1 tablet (25 mg total) by mouth daily. Please make overdue appt with Dr. Harrington Challenger before anymore refills. Thank you 2nd attempt 04/27/21   Fay Records, MD  topiramate (TOPAMAX) 50 MG tablet Take 50 mg by mouth daily. Patient not taking: Reported on 03/19/2022 03/17/21   [provider]  isosorbide mononitrate (IMDUR) 30 MG 24 hr tablet Take 0.5 tablets (15 mg total) by mouth daily. 01/04/20 05/25/20  Richardson Dopp T, PA-C  Allergies    Patient has no known allergies.    Review of Systems   Review of Systems  Physical Exam Updated Vital Signs BP 130/76   Pulse 67   Temp 98 F (36.7 C) (Oral)   Resp 13   Ht 5' 8"$  (1.727 m)   Wt (!) 139.3 kg   SpO2 100%   BMI 46.68 kg/m  Physical Exam Vitals and nursing note reviewed.  Constitutional:      General: She is not in acute distress.    Appearance: She is well-developed.  HENT:     Head: Normocephalic and atraumatic.     Right Ear: External ear normal.     Left Ear: External ear normal.     Nose: Nose normal.  Eyes:     Extraocular Movements: Extraocular movements  intact.     Conjunctiva/sclera: Conjunctivae normal.     Pupils: Pupils are equal, round, and reactive to light.  Cardiovascular:     Rate and Rhythm: Normal rate and regular rhythm.     Heart sounds: No murmur heard. Pulmonary:     Effort: Pulmonary effort is normal. No respiratory distress.     Breath sounds: Normal breath sounds.  Musculoskeletal:     Cervical back: Normal range of motion and neck supple.     Right lower leg: No edema.     Left lower leg: No edema.  Skin:    General: Skin is warm and dry.  Neurological:     Mental Status: She is alert and oriented to person, place, and time. Mental status is at baseline.  Psychiatric:        Mood and Affect: Mood normal.     ED Results / Procedures / Treatments   Labs (all labs ordered are listed, but only abnormal results are displayed) Labs Reviewed  COMPREHENSIVE METABOLIC PANEL - Abnormal; Notable for the following components:      Result Value   Sodium 134 (*)    Glucose, Bld 124 (*)    All other components within normal limits  CBC  D-DIMER, QUANTITATIVE  I-STAT BETA HCG BLOOD, ED ([facility], [facility], [facility] ONLY)  TROPONIN I (HIGH SENSITIVITY)  TROPONIN I (HIGH SENSITIVITY)    EKG EKG Interpretation  Date/Time:  Tuesday May 28 2022 10:34:04 EST Ventricular Rate:  79 PR Interval:  162 QRS Duration: 90 QT Interval:  394 QTC Calculation: 452 R Axis:   8 Text Interpretation: Sinus rhythm Low voltage, precordial leads Confirmed by Margaretmary Eddy [phone]) on 05/28/2022 10:42:10 AM  Radiology DG Chest 2 View  Result Date: 05/28/2022 CLINICAL DATA:  Provided history: Chest pain. EXAM: CHEST - 2 VIEW COMPARISON:  Prior chest radiographs 02/25/2018. FINDINGS: Heart size within normal limits. Minimal atelectasis within the lateral left lung base. No appreciable airspace consolidation. No evidence of pleural effusion or pneumothorax. No acute osseous abnormality identified. Dextrocurvature of the thoracic spine. IMPRESSION:  Minimal atelectasis within the lateral left lung base. Otherwise, no evidence of acute cardiopulmonary abnormality. Electronically Signed   By: Kellie Simmering D.O.   On: 05/28/2022 11:21    Procedures Procedures   Medications Ordered in ED Medications - No data to display  ED Course/ Medical Decision Making/ A&P Clinical Course as of 05/28/22 1610  Tue May 28, 2022  1532 Heart score 4.  Patient requesting go home after risk-benefit discussion with the patient. [RP]    Clinical Course User Index [RP] Fransico Meadow, MD  Medical Decision Making Amount and/or Complexity of Data Reviewed Labs: ordered. Radiology: ordered.   Jordy Caronia Kiedrowski is a 57 y.o. female with comorbidities that complicate the patient evaluation including with a history of nonocclusive MI, Takotsubo's cardiomyopathy, hypertension, hyperlipidemia, and anxiety who presented to the emergency department chest pain and shortness of breath  Initial Ddx:  MI, PE, panic attack, infection/pleurisy  MDM:  Concerned about possible MI given the patient's risk factors and pain responsive to nitroglycerin.  Also considered PE so obtain D-dimer specially with the patient's shortness of breath.  If her workup is reassuring we will consider panic attack.  No infectious symptoms that would suggest pleurisy but will obtain chest x-ray to further evaluate.  Plan:  Labs Troponin D-dimer Chest x-ray EKG  ED Summary/Re-evaluation:  Patient reassessed and had resolution of her chest pain.  Initial troponin and EKG WNL.  D-dimer WNL.  Chest x-ray without abnormality.  Patient has heart score of 4 and discussed admission versus discharge with her and she requests to go home with outpatient cardiology follow-up.  This patient presents to the ED for concern of complaints listed in HPI, this involves an extensive number of treatment options, and is a complaint that carries with it a high risk of complications  and morbidity. Disposition including potential need for admission considered.   Dispo: DC Home. Return precautions discussed including, but not limited to, those listed in the AVS. Allowed pt time to ask questions which were answered fully prior to dc.  Records reviewed Outpatient Clinic Notes The following labs were independently interpreted: Serial Troponins and show no acute abnormality I independently reviewed the following imaging with scope of interpretation limited to determining acute life threatening conditions related to emergency care: Chest x-ray and agree with the radiologist interpretation with the following exceptions: none I personally reviewed and interpreted cardiac monitoring: normal sinus rhythm  I personally reviewed and interpreted the pt's EKG: see above for interpretation  I have reviewed the patients home medications and made adjustments as needed  Final Clinical Impression(s) / ED Diagnoses Final diagnoses:  Chest pain, unspecified type    Rx / DC Orders ED Discharge Orders          Ordered    Ambulatory referral to Cardiology        05/28/22 1608              Fransico Meadow, MD 05/28/22 1610

## 2022-05-28 NOTE — Telephone Encounter (Signed)
Called patient about her message. Informed patient that if she has taken 3 nitroglycerin to call 911. Patient  stated she took them 5 minutes apart and the 3rd dose helped her chest pain. Informed patient that MyChart is not for urgent messages, but to please call 911 now and let them know she has taken 3 nitro. Patient agreed to plan.

## 2022-05-28 NOTE — ED Triage Notes (Signed)
Pt reports midsternal chest pain radiating to throat and right shoulder starting around 0900 today. Took 3 doses of sublingual nito and pain has resolved. Hx MI 4 years ago and states pain feels similar. Pt alert and oriented. Denies SOB, dizziness at this time.

## 2022-05-28 NOTE — ED Notes (Signed)
Reviewed discharge instructions with patient. Follow-up care reviewed. Patient verbalized understanding. Patient A&Ox4, VSS, and ambulatory with steady gait upon discharge.  

## 2022-05-28 NOTE — ED Notes (Signed)
2 gold tops sent w/ other labs

## 2022-05-28 NOTE — Discharge Instructions (Signed)
You were seen for your chest pain in the emergency department.   At home, please take Tylenol and aspirin for your chest discomfort.    Follow-up with your primary doctor in 2-3 days regarding your visit.  Cardiology will be calling you regarding an appointment within the next 72 hours.  You may contact them if you do not hear from them in that time using the information in this packet.  Return immediately to the emergency department if you experience any of the following: Worsening pain, difficulty breathing, unexplained vomiting or sweating, or any other concerning symptoms.    Thank you for visiting our Emergency Department. It was a pleasure taking care of you today.

## 2022-06-07 ENCOUNTER — Telehealth: Payer: Self-pay | Admitting: *Deleted

## 2022-06-07 NOTE — Telephone Encounter (Signed)
1st attempt to reach pt regarding surgical clearance and the need for an appointment. °Left a message for pt to call back.  °

## 2022-06-07 NOTE — Telephone Encounter (Signed)
   Name: Diana Campos  DOB: [birth year]  MRN: [id number]  Primary Cardiologist: Dorris Carnes, MD  Chart reviewed as part of pre-operative protocol coverage. Because of Diana Campos's past medical history and time since last visit, she will require a follow-up telephone visit in order to better assess preoperative cardiovascular risk.  Pre-op covering staff: - Please schedule appointment and call patient to inform them. If patient already had an upcoming appointment within acceptable timeframe, please add "pre-op clearance" to the appointment notes so provider is aware. - Please contact requesting surgeon's office via preferred method (i.e, phone, fax) to inform them of need for appointment prior to surgery.  She can hold asa x 5-7 days before her procedure if she does not have any symptoms at the time of the phone call.   Elgie Collard, PA-C  06/07/2022, 1:17 PM

## 2022-06-07 NOTE — Telephone Encounter (Signed)
Pt has appointment with Dr. Harrington Challenger in April, just [facility] for preop, as pt had recent chest pain.     Pre-operative Risk Assessment    Patient Name: Diana Campos  DOB: 29-May-1965 MRN: [id number]      Request for Surgical Clearance    Procedure:   BARIATRIC SURGERY  Date of Surgery:  Clearance TBD                                 Surgeon:  Greer Pickerel, MD Surgeon's Group or Practice Name:  [facility] Phone number:  [phone] Fax number:  [phone]   Type of Clearance Requested:   - Medical  - Pharmacy:  Hold Aspirin NOT INDICATED HOW LONG   Type of Anesthesia:  General    Additional requests/questions:    Astrid Divine   06/07/2022, 11:30 AM

## 2022-06-07 NOTE — Telephone Encounter (Signed)
Pt is scheduled to see Dr. Harrington Challenger 07/18/22, per pt, she wants to wait and see Dr. Leonard Downing and clearance will be addressed at that time.  Will route back to the requesting surgeon's office to make them aware.

## 2022-06-10 ENCOUNTER — Other Ambulatory Visit (HOSPITAL_COMMUNITY): Payer: Self-pay | Admitting: General Surgery

## 2022-06-13 ENCOUNTER — Encounter: Payer: 59 | Attending: General Surgery | Admitting: Dietician

## 2022-06-13 ENCOUNTER — Encounter: Payer: Self-pay | Admitting: Dietician

## 2022-06-13 VITALS — Ht 68.0 in | Wt 304.3 lb

## 2022-06-13 DIAGNOSIS — Z6841 Body Mass Index (BMI) 40.0 and over, adult: Secondary | ICD-10-CM | POA: Insufficient documentation

## 2022-06-13 DIAGNOSIS — Z713 Dietary counseling and surveillance: Secondary | ICD-10-CM | POA: Diagnosis not present

## 2022-06-13 DIAGNOSIS — E669 Obesity, unspecified: Secondary | ICD-10-CM

## 2022-06-13 NOTE — Progress Notes (Signed)
Nutrition Assessment for Bariatric Surgery: Pre-Surgery Behavioral and Nutrition Intervention Program   Medical Nutrition Therapy  Appt Start Time: 2:58    End Time: 3:25  Patient was seen on 06/13/2022 for Pre-Operative Nutrition Assessment. Purpose of todays visit  enhance perioperative outcomes along with a healthy weight maintenance   Referral stated Supervised Weight Loss (SWL) visits needed: 3 months  Planned surgery: Sleeve Gastrectomy Pt expectation of surgery: to lose at least 150 lbs., to be able to go up and down steps, and get off blood pressure medication Pt expectation of dietitian: to get a meal plan.    NUTRITION ASSESSMENT   Anthropometrics  Start weight at [facility]: 304.3 lbs (date: 06/13/2022)  Height: 68 in BMI: 46.27 kg/m2     Clinical   Pharmacotherapy: History of weight loss medication used: phentermine, tompiramate   Medical hx: myocardial infarction, HTN, anemia, anxiety, obesity, hypercholesterolemia Medications: lasix, aspirin, xanax, diltiazem, K, estritol patch, spironolactone Labs: HDL  Notable signs/symptoms: none noted Any previous deficiencies? No  Evaluation of Nutritional Deficiencies: Micronutrient Nutrition Focused Physical Exam: Hair: No issues observed Eyes: No issues observed Mouth: No issues observed Neck: No issues observed Nails: No issues observed Skin: No issues observed  Lifestyle & Dietary Hx  Pt states she is doing [employer], stating she is counting points.  Pt states that what she is learning in this visit will complement her tracking with Weight Watchers.   Current Physical Activity Recommendations state 150 minutes per week of moderate to vigorous movement including Cardio and 1-2 days of resistance activities as well as flexibility/balance activities:  Pts current physical activity: not recently, but planning to use treadmill, row machine (came today), stepper, with 0% recommendation reached   Sleep Hygiene: duration  and quality: 5 hours a night, sleeps well when sleep  Current Patient Perceived Stress Level as stated by pt on a scale of 1-10:  7-8       Stress Management Techniques: watch TV, audio books  According to the Dietary Guidelines for Americans Recommendation: equivalent 1.5-2 cups fruits per day, equivalent 2-3 cups vegetables per day and at least half all grains whole  Fruit servings per day (on average): 1, meeting 33% recommendation  Non-starchy vegetable servings per day (on average): 1, meeting 33% recommendation  Whole Grains per day (on average): 0  Number of meals missed/skipped per week out of 21: 7  24-Hr Dietary Recall First Meal: skip, coffee with functional coffee, with 2 tablespoon of sugar and 3  tablespoons of cream Snack: chips Second Meal: cutie tangerine, almonds (honey roasted) Snack:  Third Meal: salmon, rice, vegetable medley or chicken wraps with peppers and cheese Snack: almonds Beverages: coffee, water, water with lemon, lemonade  Alcoholic beverages per week: 0-1 (socially 1 glass about once a month)   Estimated Energy Needs Calories: 1500   NUTRITION DIAGNOSIS  Overweight/obesity ([place]-3.3) related to past poor dietary habits and physical inactivity as evidenced by patient w/ planned sleeve surgery following dietary guidelines for continued weight loss.    NUTRITION INTERVENTION  Nutrition counseling (C-1) and education (E-2) to facilitate bariatric surgery goals.  Educated pt on micronutrient deficiencies post-surgery and behavioral/dietary strategies to start in order to mitigate that risk   Behavioral and Dietary Interventions Pre-Op Goals Reviewed with the Patient Nutrition: Healthy Eating Behaviors Switch to non-caloric, non-carbonated and non-caffeinated beverages such as  water, unsweetened tea, Crystal Light and zero calorie beverages (aim for 64 oz. per day) Cut out grazing between meals or at night  Find a protein shake you like Eat every 3-5  hours        Eliminate distractions while eating (TV, computer, reading, driving, texting) Take 20-30 minutes to eat a meal  Decrease high sugar foods/decrease high fat/fried foods Eliminate alcoholic beverages Increase protein intake (eggs, fish, chicken, yogurt) before surgery Eat non starchy vegetables 2 times a day 7 days a week Eat complex carbohydrates such as whole grains and fruits   Behavioral Modification: Physical Activity Increase my usual daily activity (use stairs, park farther, etc.) Engage in _______________________  activity  _______ minutes ______ times per week  Other:    _________________________________________________________________     Problem Solving I will think about my usual eating patterns and how to tweak them How can my friends and family support me Barriers to starting my changes Learn and understand appetite verses hunger   Healthy Coping Allow for ___________ activities per week to help me manage stress Reframe negative thoughts I will keep a picture of someone or something that is my inspiration & look at it daily   Monitoring  Weigh myself once a week  Measure my progress by monitoring how my clothes fit Keep a food record of what I eat and drink for the next ________ (time period) Take pictures of what I eat and drink for the next ________ (time period) Use an app to count steps/day for the next_______ (time period) Measure my progress such as increased energy and more restful sleep Monitor your acid reflux and bowel habits, are they getting better?   *Goals are in bold above  Handouts Provided Include  Bariatric Surgery handouts (Nutrition Visits, Pre Surgery Behavioral Change Goals, Protein Shakes Brands to Choose From, Vitamins & Mineral Supplementation)  Learning Style & Readiness for Change Teaching method utilized: Visual, Auditory, and hands on  Demonstrated degree of understanding via: Teach Back  Readiness Level:  preparation Barriers to learning/adherence to lifestyle change: nothing identified  RD's Notes for Next Visit Patient progress toward chosen goals.     MONITORING & EVALUATION Dietary intake, weekly physical activity, body weight, and preoperative behavioral change goals   Next Steps  Patient is to follow up at [place] for fist SWL visit.

## 2022-06-21 ENCOUNTER — Other Ambulatory Visit: Payer: Self-pay | Admitting: *Deleted

## 2022-06-21 DIAGNOSIS — N3000 Acute cystitis without hematuria: Secondary | ICD-10-CM

## 2022-06-21 MED ORDER — NITROFURANTOIN MONOHYD MACRO 100 MG PO CAPS: 100.0000 mg | ORAL_CAPSULE | Freq: Two times a day (BID) | ORAL | 1 refills | Status: DC

## 2022-06-21 NOTE — Progress Notes (Signed)
TC from pt reporting UTI symptoms. Dr. Jodi Mourning was consulted and gave verbal order for Macrobid 100 mg BID X 7 days. RX sent.

## 2022-06-27 ENCOUNTER — Encounter: Payer: Self-pay | Admitting: Dietician

## 2022-06-27 ENCOUNTER — Encounter: Payer: 59 | Admitting: Dietician

## 2022-06-27 VITALS — Ht 68.0 in | Wt 299.7 lb

## 2022-06-27 DIAGNOSIS — E669 Obesity, unspecified: Secondary | ICD-10-CM

## 2022-06-27 NOTE — Progress Notes (Signed)
Supervised Weight Loss Visit Bariatric Nutrition Education Appt Start Time: 4:57    End Time: 5:29  Planned surgery: Sleeve Gastrectomy Pt expectation of surgery: to lose at least 150 lbs., to be able to go up and down steps, and get off blood pressure medication  1 out of 3 SWL Appointments    NUTRITION ASSESSMENT   Anthropometrics  Start weight at [facility]: 304.3 lbs (date: 06/13/2022)  Height: 68 in. Weight today: 299.7 lbs. BMI: 45.57 kg/m2     Clinical   Pharmacotherapy: History of weight loss medication used: phentermine, tompiramate   Medical hx: myocardial infarction, HTN, anemia, anxiety, obesity, hypercholesterolemia Medications: lasix, aspirin, xanax, diltiazem, K, estritol patch, spironolactone Labs: HDL  Notable signs/symptoms: none noted Any previous deficiencies? No  Lifestyle & Dietary Hx  Pt states she has been finding a protein shake that she likes, stating they are too sweet. Pt states she is now eating breakfast. Pt states she is doing [employer], stating she is counting points.  Pt states that what she is learning in this visit will complement her tracking with Weight Watchers. Pt states she got her row machine and has a treadmill.  Estimated daily fluid intake: 32 oz Supplements: none Current average weekly physical activity: 10-15 minutes daily on treadmill, trying light weights and pedals under her desk while she works.  24-Hr Dietary Recall First Meal: oatmeal, grapes  Snack:  Second Meal: cuties (tangerines) or cantaloupe, almonds (honey roasted), cheese sticks, beef jerky Snack:  Third Meal: salad with chicken or chicken with vegetables or salmon Snack: larna doon shortbread cookies Beverages: water, sweet tea  Estimated Energy Needs Calories: 1500  NUTRITION DIAGNOSIS  Overweight/obesity ([place]-3.3) related to past poor dietary habits and physical inactivity as evidenced by patient w/ planned sleeve surgery following dietary guidelines for  continued weight loss.   NUTRITION INTERVENTION  Nutrition counseling (C-1) and education (E-2) to facilitate bariatric surgery goals.  Purpose of hydration: Water makes up over 50% of your total body water, and is part of many organs throughout the body. Water is essential to transport digested nutrients, regulate body temperature, rid the body of waste products, and protects joints and the spinal cord. When not properly hydrated you will begin to experience headaches, cramps and dizziness. Further dehydration can result in rapid heart rate, shock, oliguria, and may cause seizures.  https://www.merckmanuals.com/home/hormonal-and-metabolic-disordehttps://www.usgs.gov/special-topic/water-science-school/science/water-you-water-and-human-body?qt-science_center_objects=0#qt-science_center_objectsrs/water-balance/about-body-water HistoricalGrowth.gl https://www.stevens.org/ PimpTShirt.fi https://www.health.InvestmentBrowse.at  Pre-Op Goals Progress & New Goals Continue: Avoid skipping meals; aim to eat every 3-5 hours Continue: Eat slow, chew well; aim for 20-30 minutes per meal/snack New: practice not drinking with meals New: avoid sugar sweetened beverages New: track fluid; aim for 64 oz of fluids, mostly plain water  Handouts Provided Include    Learning Style & Readiness for Change Teaching method utilized: Visual & Auditory  Demonstrated degree of understanding via: Teach Back  Readiness Level: preparation Barriers to learning/adherence to lifestyle change: nothing identified  RD's Notes for next Visit  Patient progress toward chosen goals.   MONITORING & EVALUATION Dietary intake, weekly physical activity, body weight, and pre-op goals in 1 month.   Next Steps  Patient is to return to [facility]  in 1 month for next SWL visit.

## 2022-07-05 ENCOUNTER — Other Ambulatory Visit (HOSPITAL_COMMUNITY): Payer: 59

## 2022-07-18 ENCOUNTER — Encounter: Payer: Self-pay | Admitting: Internal Medicine

## 2022-07-18 ENCOUNTER — Ambulatory Visit (INDEPENDENT_AMBULATORY_CARE_PROVIDER_SITE_OTHER): Payer: 59 | Admitting: Licensed Clinical Social Worker

## 2022-07-18 ENCOUNTER — Ambulatory Visit: Payer: 59 | Attending: Internal Medicine | Admitting: Internal Medicine

## 2022-07-18 DIAGNOSIS — E7800 Pure hypercholesterolemia, unspecified: Secondary | ICD-10-CM

## 2022-07-18 DIAGNOSIS — F4320 Adjustment disorder, unspecified: Secondary | ICD-10-CM

## 2022-07-18 DIAGNOSIS — I5032 Chronic diastolic (congestive) heart failure: Secondary | ICD-10-CM | POA: Diagnosis not present

## 2022-07-18 DIAGNOSIS — I10 Essential (primary) hypertension: Secondary | ICD-10-CM | POA: Diagnosis not present

## 2022-07-18 DIAGNOSIS — I5181 Takotsubo syndrome: Secondary | ICD-10-CM | POA: Diagnosis not present
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
## 2022-07-18 NOTE — Progress Notes (Signed)
Cardiology Office Note   Date:  07/18/2022   ID:  Gunnar Bulla, DOB 12-11-1965, MRN [id number]  PCP:  Salli Real, MD  Cardiologist:   Dietrich Pates, MD   Pt presents for follow up of Takosubo's CM (hx of )    History of Present Illness: Diana Campos is a 57 y.o. female with a history of Takotsubo's CM  (s/p NSTEMI in 2019  LHC with normal coronary arteries   LVEF 35%; echo showed LVEF 55%   Limited echo in Nov 2019 LVEF 60 to 65%     Myoview in 2021 low risk The pt also has a hx of HTN, HL, obestity    She was last seen in cardiology clinic in July 2023 by Wende Mott   The pt is doing OK   She denies CP   Breathing is OK   She does some walking     She is being evaluated for bariatric surgery        Current Meds  Medication Sig   ALPRAZolam (XANAX) 0.25 MG tablet Take 1 tablet (0.25 mg total) by mouth 2 (two) times daily. (Patient taking differently: Take 0.25 mg by mouth as needed for anxiety.)   amitriptyline (ELAVIL) 25 MG tablet Take 1 tablet (25 mg total) by mouth at bedtime. (Patient taking differently: Take 25 mg by mouth as needed for sleep.)   aspirin 81 MG chewable tablet Chew 1 tablet (81 mg total) by mouth daily.   atorvastatin (LIPITOR) 40 MG tablet Take 1 tablet (40 mg total) by mouth daily.   diltiazem (CARDIZEM CD) 120 MG 24 hr capsule Take 1 capsule (120 mg total) by mouth daily. Please make overdue appt with Dr. Tenny Craw before anymore refills. Thank you 3rd and Final Attempt   estradiol (CLIMARA) 0.1 mg/24hr patch Place 1 patch (0.1 mg total) onto the skin once a week.   furosemide (LASIX) 20 MG tablet Take 1 tablet (20 mg total) by mouth every Monday, Wednesday, and Friday.   ibuprofen (ADVIL) 800 MG tablet Take 1 tablet (800 mg total) by mouth every 8 (eight) hours as needed.   metroNIDAZOLE (METROGEL) 0.75 % vaginal gel Place 1 Applicatorful vaginally 2 (two) times daily. (Patient taking differently: Place 1 Applicatorful vaginally as needed (flare ups).)    nitroGLYCERIN (NITROSTAT) 0.4 MG SL tablet PLACE 1 TABLET (0.4 MG TOTAL) UNDER THE TONGUE EVERY 5 MINUTES AS NEEDED   potassium chloride (KLOR-CON M) 10 MEQ tablet Take 1 tablet (10 mEq total) by mouth every Monday, Wednesday, and Friday.   spironolactone (ALDACTONE) 25 MG tablet Take 1 tablet (25 mg total) by mouth daily. Please make overdue appt with Dr. Tenny Craw before anymore refills. Thank you 2nd attempt   [DISCONTINUED] Melatonin 10 MG TABS Take 1 tablet by mouth at bedtime.   [DISCONTINUED] nitrofurantoin, macrocrystal-monohydrate, (MACROBID) 100 MG capsule Take 1 capsule (100 mg total) by mouth 2 (two) times daily.   [DISCONTINUED] topiramate (TOPAMAX) 50 MG tablet Take 50 mg by mouth daily.     Allergies:   Patient has no known allergies.   Past Medical History:  Diagnosis Date   Acute heart failure    Anemia    past hx before hysterectomy   Anxiety    Blood transfusion without reported diagnosis    Chest pain    Myoview 9/21: EF 68, no ischemia; low risk   Hypertension    NSTEMI (non-ST elevated myocardial infarction) 02/26/2018   Takotsubo cardiomyopathy     Past  Surgical History:  Procedure Laterality Date   ABDOMINAL HYSTERECTOMY     BACK SURGERY     CARDIAC CATHETERIZATION  02/17/2018   COLONOSCOPY     ? 2007- normal per pt- done due to eating lead and nacho chips    COLPORRHAPHY  10/24/2014   Posterior, with uterosacral ligament suspension   LEFT HEART CATH AND CORONARY ANGIOGRAPHY N/A 02/25/2018   Procedure: LEFT HEART CATH AND CORONARY ANGIOGRAPHY;  Surgeon: Lyn Records, MD;  Location: [facility] INVASIVE CV LAB;  Service: Cardiovascular;  Laterality: N/A;   TRACHELECTOMY  10/24/2014   TUBAL LIGATION     ULTRASOUND GUIDANCE FOR VASCULAR ACCESS  02/25/2018   Procedure: Ultrasound Guidance For Vascular Access;  Surgeon: Lyn Records, MD;  Location: [facility] INVASIVE CV LAB;  Service: Cardiovascular;;     Social History:  The patient  reports that she has never smoked.  She has never been exposed to tobacco smoke. She has never used smokeless tobacco. She reports current alcohol use. She reports that she does not use drugs.   Family History:  The patient's family history includes Cancer in her paternal grandmother; Hypertension in her mother.    ROS:  Please see the history of present illness. All other systems are reviewed and  Negative to the above problem except as noted.    PHYSICAL EXAM: VS:  BP 115/62   Pulse 76   Ht 5\' 8"  (1.727 m)   Wt 294 lb (133.4 kg)   SpO2 98%   BMI 44.70 kg/m   GEN: Morbidly obese 57 yo  no acute distress  HEENT: normal  Neck: no JVD, carotid bruits Cardiac: RRR; no murmur   No LE  edema  Respiratory:  clear to auscultation  GI: soft, nontender, nondistended,   EKG:  EKG is ordered today.  NSR 76 bpm    Lipid Panel    Component Value Date/Time   CHOL 137 02/22/2022 0712   TRIG 80 02/22/2022 0712   HDL 34 (L) 02/22/2022 0712   CHOLHDL 4.0 02/22/2022 0712   CHOLHDL 5.9 02/25/2018 0532   VLDL 13 02/25/2018 0532   LDLCALC 87 02/22/2022 0712      Wt Readings from Last 3 Encounters:  07/18/22 294 lb (133.4 kg)  06/27/22 299 lb 11.2 oz (135.9 kg)  06/13/22 (!) 304 lb 4.8 oz (138 kg)      ASSESSMENT AND PLAN:  1  Hx Takotsubo's CM   2019   LVEF normal in late November 2019    DOing well   No symptoms of CP       2   HTN  BP is well controlled     3  HL  LDL 87  HDL 34  Trig 80   Keep on lipitor   Follow after surgery   4  Morbid obesity   Pt currently being evaluated for bariatric surgery    Frm a cardiac standpoint I think she is low risk for a major cardiac event   OK to proceed    Plan for follow up next winter    Will be available as needed in perioperative period      Current medicines are reviewed at length with the patient today.  The patient does not have concerns regarding medicines.  Signed, Dietrich Pates, MD  07/18/2022 9:17 PM    [facility] [address] [place],  [place], [place]  [address] Phone: [phone]; Fax: [phone]

## 2022-07-18 NOTE — Patient Instructions (Signed)
Medication Instructions:   *If you need a refill on your cardiac medications before your next appointment, please call your pharmacy*   Lab Work:  If you have labs (blood work) drawn today and your tests are completely normal, you will receive your results only by: MyChart Message (if you have MyChart) OR A paper copy in the mail If you have any lab test that is abnormal or we need to change your treatment, we will call you to review the results.   Testing/Procedures:    Follow-Up: At [facility], you and your health needs are our priority.  As part of our continuing mission to provide you with exceptional heart care, we have created designated Provider Care Teams.  These Care Teams include your primary Cardiologist (physician) and Advanced Practice Providers (APPs -  Physician Assistants and Nurse Practitioners) who all work together to provide you with the care you need, when you need it.  We recommend signing up for the patient portal called "MyChart".  Sign up information is provided on this After Visit Summary.  MyChart is used to connect with patients for Virtual Visits (Telemedicine).  Patients are able to view lab/test results, encounter notes, upcoming appointments, etc.  Non-urgent messages can be sent to your provider as well.   To learn more about what you can do with MyChart, go to [email or web address].    Your next appointment:   7 month(s)  Provider:   Paula Ross, MD     Other Instructions   

## 2022-07-23 ENCOUNTER — Encounter: Payer: Self-pay | Admitting: Dietician

## 2022-07-23 ENCOUNTER — Encounter: Payer: 59 | Attending: General Surgery | Admitting: Dietician

## 2022-07-23 VITALS — Ht 68.0 in | Wt 293.7 lb

## 2022-07-23 DIAGNOSIS — E669 Obesity, unspecified: Secondary | ICD-10-CM | POA: Insufficient documentation

## 2022-07-23 NOTE — Progress Notes (Signed)
Supervised Weight Loss Visit Bariatric Nutrition Education Appt Start Time: 5:03    End Time: 5:29  Planned surgery: Sleeve Gastrectomy Pt expectation of surgery: to lose at least 150 lbs., to be able to go up and down steps, and get off blood pressure medication  2 out of 3 SWL Appointments    NUTRITION ASSESSMENT   Anthropometrics  Start weight at [facility]: 304.3 lbs (date: 06/13/2022)  Height: 68 in. Weight today: 293.7 lbs. BMI: 44.66 kg/m2     Clinical   Pharmacotherapy: History of weight loss medication used: phentermine, tompiramate   Medical hx: myocardial infarction, HTN, anemia, anxiety, obesity, hypercholesterolemia Medications: lasix, aspirin, xanax, diltiazem, K, estritol patch, spironolactone Labs: HDL  Notable signs/symptoms: none noted Any previous deficiencies? No  Lifestyle & Dietary Hx  Pt states she is doing better with fluid intake and increased her protein intake, stating she is getting at least 60 grams of protein a day. Pt states she is doing well not drinking with her meals and cutting back on her portion sizes. Pt states she cut out sweet tea and soda. Pt states she is not eating as much short bread cookies.  Estimated daily fluid intake: 48 oz Supplements: none Current average weekly physical activity: 10-15 minutes daily on treadmill, trying light weights and pedals under her desk while she works.  24-Hr Dietary Recall First Meal: oatmeal, grapes  Snack:  Second Meal: cuties (tangerines) or cantaloupe, almonds (honey roasted), cheese sticks, beef jerky Snack:  Third Meal: salad with chicken or chicken with vegetables or salmon Snack: larna doon shortbread cookies Beverages: water, water with flavorings  Estimated Energy Needs Calories: 1500  NUTRITION DIAGNOSIS  Overweight/obesity ([place]-3.3) related to past poor dietary habits and physical inactivity as evidenced by patient w/ planned sleeve surgery following dietary guidelines for continued  weight loss.   NUTRITION INTERVENTION  Nutrition counseling (C-1) and education (E-2) to facilitate bariatric surgery goals.  Purpose of hydration: Water makes up over 50% of your total body water, and is part of many organs throughout the body. Water is essential to transport digested nutrients, regulate body temperature, rid the body of waste products, and protects joints and the spinal cord. When not properly hydrated you will begin to experience headaches, cramps and dizziness. Further dehydration can result in rapid heart rate, shock, oliguria, and may cause seizures.  https://www.merckmanuals.com/home/hormonal-and-metabolic-disordehttps://www.usgs.gov/special-topic/water-science-school/science/water-you-water-and-human-body?qt-science_center_objects=0#qt-science_center_objectsrs/water-balance/about-body-water FriendLock.it InvestmentInstructor.com.cy FurEliminator.es https://www.health.BasicFM.no Encouraged patient to honor their body's internal hunger and fullness cues.  Throughout the day, check in mentally and rate hunger. Stop eating when satisfied not full regardless of how much food is left on the plate.  Get more if still hungry 20-30 minutes later.  The key is to honor satisfaction so throughout the meal, rate fullness factor and stop when comfortably satisfied not physically full. The key is to honor hunger and fullness without any feelings of guilt or shame.  Pay attention to what the internal cues are, rather than any external factors. This will enhance the confidence you have in listening to your own body and following those internal cues enabling you to increase how often you eat when you are hungry not out of appetite and stop when you are satisfied not full.  Encouraged pt to continue  to eat balanced meals inclusive of non starchy vegetables 2 times a day 7 days a week Encouraged pt to choose lean protein sources: limiting beef, pork, sausage, hotdogs, and lunch meat Encourage pt to choose healthy fats such as plant based limiting animal fats Encouraged pt  to continue to drink a minium 64 fluid ounces with half being plain water to satisfy proper hydration    Pre-Op Goals Progress & New Goals Continue: Avoid skipping meals; aim to eat every 3-5 hours Continue: Eat slow, chew well; aim for 20-30 minutes per meal/snack Continue: practice not drinking with meals Continue: avoid sugar sweetened beverages Continue: track fluid; aim for 64 oz of fluids, mostly plain water New: Aim for satisfaction before fullness  Handouts Provided Include  Meal Ideas Handout  Learning Style & Readiness for Change Teaching method utilized: Visual & Auditory  Demonstrated degree of understanding via: Teach Back  Readiness Level: preparation Barriers to learning/adherence to lifestyle change: nothing identified  RD's Notes for next Visit  Patient progress toward chosen goals.   MONITORING & EVALUATION Dietary intake, weekly physical activity, body weight, and pre-op goals in 1 month.   Next Steps  Patient is to return to [facility] in 1 month for next SWL visit.

## 2022-08-14 ENCOUNTER — Ambulatory Visit (HOSPITAL_COMMUNITY)
Admission: RE | Admit: 2022-08-14 | Discharge: 2022-08-14 | Disposition: A | Discharge status: Home / Self Care | Payer: 59 | Source: Ambulatory Visit | Attending: General Surgery | Admitting: General Surgery

## 2022-08-16 ENCOUNTER — Telehealth: Payer: Self-pay

## 2022-08-16 NOTE — Telephone Encounter (Signed)
Spoke with the patient. Advised Dr Andrey Campanile has asked she have an EGD for evaluation of the esophagus before her scheduled surgery. Patient agrees to the following; Pre-visit 08/19/22 arrive at 12:30 pm per Darcey Nora, RN EGD 08/22/22 arrive at 10:30 am per Dr Lavon Paganini.

## 2022-08-17 ENCOUNTER — Encounter: Payer: Self-pay | Admitting: Certified Registered Nurse Anesthetist

## 2022-08-19 ENCOUNTER — Encounter: Payer: Self-pay | Admitting: Gastroenterology

## 2022-08-19 ENCOUNTER — Ambulatory Visit (AMBULATORY_SURGERY_CENTER): Payer: 59

## 2022-08-19 VITALS — Ht 68.0 in | Wt 293.0 lb

## 2022-08-19 DIAGNOSIS — Z01818 Encounter for other preprocedural examination: Secondary | ICD-10-CM

## 2022-08-19 NOTE — Progress Notes (Signed)
Patient weight on PV day =293 lbsNo egg or soy allergy known to patient;  No issues known to pt with past sedation with any surgeries or procedures Patient denies ever being told they had issues or difficulty with intubation;  No FH of Malignant Hyperthermia; Pt is not on diet pills; Pt is not on home 02;  Pt is not on blood thinners;  Pt reports issues with constipation- takes Milk of Magnesia twice a week to "stay regular" No A fib or A flutter; Have any cardiac testing pending--NO Pt instructed to use Singlecare.com or GoodRx for a price reduction on prep;   Insurance verified during PV appt=[employer]  Patient's chart reviewed by Cathlyn Parsons CNRA prior to previsit and patient appropriate for the [facility].  Previsit completed and red dot placed by patient's name on their procedure day (on provider's schedule).    Instructions printed for patient during PV appt;

## 2022-08-22 ENCOUNTER — Ambulatory Visit (AMBULATORY_SURGERY_CENTER): Payer: 59 | Admitting: Gastroenterology

## 2022-08-22 ENCOUNTER — Ambulatory Visit: Payer: 59 | Admitting: Dietician

## 2022-08-22 ENCOUNTER — Encounter: Payer: Self-pay | Admitting: Gastroenterology

## 2022-08-22 VITALS — BP 105/70 | HR 73 | Temp 97.7°F | Resp 14 | Ht 68.0 in | Wt 293.0 lb

## 2022-08-22 DIAGNOSIS — K2970 Gastritis, unspecified, without bleeding: Secondary | ICD-10-CM | POA: Diagnosis not present

## 2022-08-22 DIAGNOSIS — K219 Gastro-esophageal reflux disease without esophagitis: Secondary | ICD-10-CM | POA: Diagnosis not present

## 2022-08-22 DIAGNOSIS — Z01818 Encounter for other preprocedural examination: Secondary | ICD-10-CM | POA: Diagnosis present

## 2022-08-22 DIAGNOSIS — R1310 Dysphagia, unspecified: Secondary | ICD-10-CM

## 2022-08-22 DIAGNOSIS — R933 Abnormal findings on diagnostic imaging of other parts of digestive tract: Secondary | ICD-10-CM

## 2022-08-22 DIAGNOSIS — K259 Gastric ulcer, unspecified as acute or chronic, without hemorrhage or perforation: Secondary | ICD-10-CM

## 2022-08-22 MED ORDER — SODIUM CHLORIDE 0.9 % IV SOLN: 500.0000 mL | Freq: Once | INTRAVENOUS | Status: DC

## 2022-08-22 MED ORDER — PANTOPRAZOLE SODIUM 40 MG PO TBEC: 40.0000 mg | DELAYED_RELEASE_TABLET | Freq: Two times a day (BID) | ORAL | 6 refills | Status: DC

## 2022-08-22 NOTE — Patient Instructions (Addendum)
YOU HAD AN ENDOSCOPIC PROCEDURE TODAY AT THE [facility]:   Refer to the procedure report that was given to you for any specific questions about what was found during the examination.  If the procedure report does not answer your questions, please call your gastroenterologist to clarify.  If you requested that your care partner not be given the details of your procedure findings, then the procedure report has been included in a sealed envelope for you to review at your convenience later.  YOU SHOULD EXPECT: Some feelings of bloating in the abdomen. Passage of more gas than usual.  Walking can help get rid of the air that was put into your GI tract during the procedure and reduce the bloating. If you had a lower endoscopy (such as a colonoscopy or flexible sigmoidoscopy) you may notice spotting of blood in your stool or on the toilet paper. If you underwent a bowel prep for your procedure, you may not have a normal bowel movement for a few days.  Please Note:  You might notice some irritation and congestion in your nose or some drainage.  This is from the oxygen used during your procedure.  There is no need for concern and it should clear up in a day or so.  SYMPTOMS TO REPORT IMMEDIATELY:   Following upper endoscopy (EGD)  Vomiting of blood or coffee ground material  New chest pain or pain under the shoulder blades  Painful or persistently difficult swallowing  New shortness of breath  Fever of 100F or higher  Black, tarry-looking stools  For urgent or emergent issues, a gastroenterologist can be reached at any hour by calling [phone]. Do not use MyChart messaging for urgent concerns.    DIET:  We do recommend a small meal at first, but then you may proceed to your regular diet.  Drink plenty of fluids but you should avoid alcoholic beverages for 24 hours.  MEDICATIONS: Continue present medications. Use Protonix (pantoprazole) 40 mg by mouth twice daily for 3 months, then  followed by once daily. NO Ibuprofen, Naproxen, or other non-steroidal anti-inflammatory drugs.  Please see handouts given to you by your recovery nurse: Gastritis, Esophageal stricture.  FOLLOW UP: Await pathology results for further advisement.  Thank you for allowing [place] to provide for your healthcare needs today.  ACTIVITY:  You should plan to take it easy for the rest of today and you should NOT DRIVE or use heavy machinery until tomorrow (because of the sedation medicines used during the test).    FOLLOW UP: Our staff will call the number listed on your records the next business day following your procedure.  We will call around 7:15- 8:00 am to check on you and address any questions or concerns that you may have regarding the information given to you following your procedure. If we do not reach you, we will leave a message.     If any biopsies were taken you will be contacted by phone or by letter within the next 1-3 weeks.  Please call us at [phone] if you have not heard about the biopsies in 3 weeks.    SIGNATURES/CONFIDENTIALITY: You and/or your care partner have signed paperwork which will be entered into your electronic medical record.  These signatures attest to the fact that that the information above on your After Visit Summary has been reviewed and is understood.  Full responsibility of the confidentiality of this discharge information lies with you and/or your care-partner.

## 2022-08-22 NOTE — Progress Notes (Signed)
Called to room to assist during endoscopic procedure.  Patient ID and intended procedure confirmed with present staff. Received instructions for my participation in the procedure from the performing physician.  

## 2022-08-22 NOTE — Progress Notes (Signed)
Report given to PACU, vss 

## 2022-08-22 NOTE — Progress Notes (Signed)
1146 Robinul 0.1 mg IV given due large amount of secretions upon assessment.  MD made aware, vss  

## 2022-08-22 NOTE — Progress Notes (Signed)
[facility] History and Physical   Primary Care Physician:  Salli Real, MD   Reason for Procedure:  Abnormal esophagogram, dysphagia  Plan:    EGD  with possible interventions as needed     HPI: Diana Campos is a very pleasant 57 y.o. female here for EGD for evaluation of abnormal esophagogram during pre op eval for bariatric surgery.  The risks and benefits as well as alternatives of endoscopic procedure(s) have been discussed and reviewed. All questions answered. The patient agrees to proceed.    Past Medical History:  Diagnosis Date   Acute heart failure (HCC)    Anemia    past hx before hysterectomy   Anxiety    Blood transfusion without reported diagnosis 2014   Chest pain    Myoview 9/21: EF 68, no ischemia; low risk   Hyperlipidemia    on meds   Hypertension    on meds   NSTEMI (non-ST elevated myocardial infarction) (HCC) 2019   Takotsubo cardiomyopathy     Past Surgical History:  Procedure Laterality Date   ABDOMINAL HYSTERECTOMY  2016   BACK SURGERY  2002   CARDIAC CATHETERIZATION  02/17/2018   COLONOSCOPY  2007   ? normal per pt- done due to eating lead and nacho chips   COLONOSCOPY  2021   KN-MAC-prep good-tics/int hems   COLPORRHAPHY  10/24/2014   Posterior, with uterosacral ligament suspension   LEFT HEART CATH AND CORONARY ANGIOGRAPHY N/A 02/25/2018   Procedure: LEFT HEART CATH AND CORONARY ANGIOGRAPHY;  Surgeon: Lyn Records, MD;  Location: [facility] INVASIVE CV LAB;  Service: Cardiovascular;  Laterality: N/A;   TRACHELECTOMY  10/24/2014   removed cervix   TUBAL LIGATION  1990   ULTRASOUND GUIDANCE FOR VASCULAR ACCESS  02/25/2018   Procedure: Ultrasound Guidance For Vascular Access;  Surgeon: Lyn Records, MD;  Location: [facility] INVASIVE CV LAB;  Service: Cardiovascular;;    Prior to Admission medications   Medication Sig Start Date End Date Taking? Authorizing Provider  amitriptyline (ELAVIL) 25 MG tablet Take 1 tablet (25 mg total) by  mouth at bedtime. Patient taking differently: Take 25 mg by mouth as needed (interstitial cystitis). 10/22/21  Yes Anyanwu, Jethro Bastos, MD  aspirin 81 MG chewable tablet Chew 1 tablet (81 mg total) by mouth daily. 03/01/18  Yes Kroeger, Dot Lanes M., PA-C  diltiazem (CARDIZEM CD) 120 MG 24 hr capsule Take 1 capsule (120 mg total) by mouth daily. Please make overdue appt with Dr. Tenny Craw before anymore refills. Thank you 3rd and Final Attempt 04/16/21  Yes Pricilla Riffle, MD  estradiol [facility]) 0.1 mg/24hr patch Place 1 patch (0.1 mg total) onto the skin once a week. 03/19/22  Yes Brock Bad, MD  furosemide (LASIX) 20 MG tablet Take 1 tablet (20 mg total) by mouth every Monday, Wednesday, and Friday. 10/24/21  Yes Weaver, Scott T, PA-C  potassium chloride (KLOR-CON) 10 MEQ tablet Take 10 mEq by mouth 3 (three) times a week. 08/06/22  Yes [provider]  spironolactone (ALDACTONE) 25 MG tablet Take 1 tablet (25 mg total) by mouth daily. Please make overdue appt with Dr. Tenny Craw before anymore refills. Thank you 2nd attempt Patient taking differently: Take 25 mg by mouth daily. 04/27/21  Yes Pricilla Riffle, MD  ALPRAZolam [facility]ud[facility] Feeler) 0.25 MG tablet Take 1 tablet (0.25 mg total) by mouth 2 (two) times daily. Patient taking differently: Take 0.25 mg by mouth as needed for anxiety. 03/19/22   Brock Bad, MD  atorvastatin (LIPITOR) 40 MG tablet Take 1 tablet (40 mg total) by mouth daily. Patient not taking: Reported on 08/19/2022 11/20/21   Tereso Newcomer T, PA-C  ibuprofen (ADVIL) 800 MG tablet Take 1 tablet (800 mg total) by mouth every 8 (eight) hours as needed. 03/19/22   Brock Bad, MD  Magnesium Hydroxide (MILK OF MAGNESIA PO) Take 1 Dose by mouth 2 (two) times a week.    [provider]  metroNIDAZOLE (METROGEL) 0.75 % vaginal gel Place 1 Applicatorful vaginally 2 (two) times daily. Patient not taking: Reported on 08/19/2022 03/19/22   Brock Bad, MD  nitroGLYCERIN (NITROSTAT)  0.4 MG SL tablet PLACE 1 TABLET (0.4 MG TOTAL) UNDER THE TONGUE EVERY 5 MINUTES AS NEEDED Patient not taking: Reported on 08/19/2022 10/23/21   Beatrice Lecher, PA-C    Current Outpatient Medications  Medication Sig Dispense Refill   amitriptyline (ELAVIL) 25 MG tablet Take 1 tablet (25 mg total) by mouth at bedtime. (Patient taking differently: Take 25 mg by mouth as needed (interstitial cystitis).) 90 tablet 3   aspirin 81 MG chewable tablet Chew 1 tablet (81 mg total) by mouth daily.     diltiazem (CARDIZEM CD) 120 MG 24 hr capsule Take 1 capsule (120 mg total) by mouth daily. Please make overdue appt with Dr. Tenny Craw before anymore refills. Thank you 3rd and Final Attempt 15 capsule 0   estradiol (CLIMARA) 0.1 mg/24hr patch Place 1 patch (0.1 mg total) onto the skin once a week. 4 patch 12   furosemide (LASIX) 20 MG tablet Take 1 tablet (20 mg total) by mouth every Monday, Wednesday, and Friday. 36 tablet 3   potassium chloride (KLOR-CON) 10 MEQ tablet Take 10 mEq by mouth 3 (three) times a week.     spironolactone (ALDACTONE) 25 MG tablet Take 1 tablet (25 mg total) by mouth daily. Please make overdue appt with Dr. Tenny Craw before anymore refills. Thank you 2nd attempt (Patient taking differently: Take 25 mg by mouth daily.) 15 tablet 0   ALPRAZolam (XANAX) 0.25 MG tablet Take 1 tablet (0.25 mg total) by mouth 2 (two) times daily. (Patient taking differently: Take 0.25 mg by mouth as needed for anxiety.) 60 tablet 2   atorvastatin (LIPITOR) 40 MG tablet Take 1 tablet (40 mg total) by mouth daily. (Patient not taking: Reported on 08/19/2022) 90 tablet 3   ibuprofen (ADVIL) 800 MG tablet Take 1 tablet (800 mg total) by mouth every 8 (eight) hours as needed. 30 tablet 5   Magnesium Hydroxide (MILK OF MAGNESIA PO) Take 1 Dose by mouth 2 (two) times a week.     metroNIDAZOLE (METROGEL) 0.75 % vaginal gel Place 1 Applicatorful vaginally 2 (two) times daily. (Patient not taking: Reported on 08/19/2022) 70 g 2    nitroGLYCERIN (NITROSTAT) 0.4 MG SL tablet PLACE 1 TABLET (0.4 MG TOTAL) UNDER THE TONGUE EVERY 5 MINUTES AS NEEDED (Patient not taking: Reported on 08/19/2022) 25 tablet 3   Current Facility-Administered Medications  Medication Dose Route Frequency Provider Last Rate Last Admin   0.9 %  sodium chloride infusion  500 mL Intravenous Once Napoleon Form, MD        Allergies as of 08/22/2022   (No Known Allergies)    Family History  Problem Relation Age of Onset   Hypertension Mother    Rectal cancer Brother 30   Colon cancer Brother 40   Cancer Paternal Grandmother    Colon polyps Neg Hx    Esophageal cancer Neg  Hx    Stomach cancer Neg Hx     Social History   Socioeconomic History   Marital status: Divorced    Spouse name: Not on file   Number of children: Not on file   Years of education: Not on file   Highest education level: Not on file  Occupational History   Not on file  Tobacco Use   Smoking status: Never    Passive exposure: Never   Smokeless tobacco: Never  Vaping Use   Vaping Use: Never used  Substance and Sexual Activity   Alcohol use: Not Currently    Comment: occ   Drug use: No   Sexual activity: Yes    Partners: Male  Other Topics Concern   Not on file  Social History Narrative   Not on file   Social Determinants of Health   Financial Resource Strain: Not on file  Food Insecurity: Not on file  Transportation Needs: Not on file  Physical Activity: Not on file  Stress: Not on file  Social Connections: Not on file  Intimate Partner Violence: Not on file    Review of Systems:  All other review of systems negative except as mentioned in the HPI.  Physical Exam: Vital signs in last 24 hours: Blood Pressure 105/63   Pulse 75   Temperature 97.7 F (36.5 C)   Height 5\' 8"  (1.727 m)   Weight 293 lb (132.9 kg)   Oxygen Saturation 94%   Body Mass Index 44.55 kg/m  General:   Alert, NAD Lungs:  Clear .   Heart:  Regular rate and  rhythm Abdomen:  Soft, nontender and nondistended. Neuro/Psych:  Alert and cooperative. Normal mood and affect. A and O x 3  Reviewed labs, radiology imaging, old records and pertinent past GI work up  Patient is appropriate for planned procedure(s) and anesthesia in an ambulatory setting   K. Scherry Ran , MD [phone]

## 2022-08-22 NOTE — Op Note (Signed)
[facility] Patient Name: Diana Campos Procedure Date: 08/22/2022 11:42 AM MRN: [id number] Endoscopist: Napoleon Form , MD, [id number] Age: 57 Referring MD:  Date of Birth: March 02, 1966 Gender: Female Account #: [id number] Procedure:                Upper GI endoscopy Indications:              Dysphagia, Abnormal cine-esophagram Medicines:                Monitored Anesthesia Care Procedure:                Pre-Anesthesia Assessment:                           - Prior to the procedure, a History and Physical                            was performed, and patient medications and                            allergies were reviewed. The patient's tolerance of                            previous anesthesia was also reviewed. The risks                            and benefits of the procedure and the sedation                            options and risks were discussed with the patient.                            All questions were answered, and informed consent                            was obtained. Prior Anticoagulants: The patient has                            taken no anticoagulant or antiplatelet agents. ASA                            Grade Assessment: III - A patient with severe                            systemic disease. After reviewing the risks and                            benefits, the patient was deemed in satisfactory                            condition to undergo the procedure.                           After obtaining informed consent, the endoscope was  passed under direct vision. Throughout the                            procedure, the patient's blood pressure, pulse, and                            oxygen saturations were monitored continuously. The                            GIF [id number] #[id number] was introduced through the                            mouth, and advanced to the second part of duodenum.                            The upper  GI endoscopy was accomplished without                            difficulty. The patient tolerated the procedure                            well. Scope In: Scope Out: Findings:                 The Z-line was regular and was found 40 cm from the                            incisors.                           LA Grade C (one or more mucosal breaks continuous                            between tops of 2 or more mucosal folds, less than                            75% circumference) esophagitis with no bleeding was                            found 38 to 39 cm from the incisors. Biopsies were                            taken with a cold forceps for histology.                           No endoscopic abnormality was evident in the                            esophagus to explain the patient's complaint of                            dysphagia. It was decided, however, to proceed with  dilation of the entire esophagus. The scope was                            withdrawn. Dilation was performed with a Maloney                            dilator with no resistance at 54 Fr. The dilation                            site was examined following endoscope reinsertion                            and showed no change.                           One non-bleeding cratered gastric ulcer with no                            stigmata of bleeding was found in the prepyloric                            region of the stomach. The lesion was 6 mm in                            largest dimension.                           Patchy moderate inflammation characterized by                            congestion (edema), erosions, erythema and                            friability was found in the gastric antrum and in                            the prepyloric region of the stomach. Biopsies were                            taken with a cold forceps for Helicobacter pylori                             testing.                           The cardia and gastric fundus were normal on                            retroflexion.                           The examined duodenum was normal. Complications:            No immediate complications. Estimated Blood Loss:     Estimated blood loss was minimal. Impression:               -  Z-line regular, 40 cm from the incisors.                           - LA Grade C reflux esophagitis with no bleeding.                            Biopsied.                           - No endoscopic esophageal abnormality to explain                            patient's dysphagia. Esophagus dilated. Dilated.                           - Non-bleeding gastric ulcer with no stigmata of                            bleeding.                           - Gastritis. Biopsied.                           - Normal examined duodenum. Recommendation:           - Use Protonix (pantoprazole) 40 mg PO BID for 3                            months followed by once daily.                           - Resume previous diet.                           - Continue present medications.                           - Await pathology results.                           - No ibuprofen, naproxen, or other non-steroidal                            anti-inflammatory drugs. Napoleon Form, MD 08/22/2022 12:22:53 PM This report has been signed electronically.

## 2022-08-23 ENCOUNTER — Telehealth: Payer: Self-pay

## 2022-08-23 NOTE — Telephone Encounter (Signed)
  Follow up Call-     08/22/2022   10:42 AM  Call back number  Post procedure Call Back phone  # [phone]  Permission to leave phone message Yes     Patient questions:  Do you have a fever, pain , or abdominal swelling? No. Pain Score  0 *  Have you tolerated food without any problems? Yes.    Have you been able to return to your normal activities? Yes.    Do you have any questions about your discharge instructions: Diet   No. Medications  No. Follow up visit  No.  Do you have questions or concerns about your Care? No.  Actions: * If pain score is 4 or above: No action needed, pain <4.

## 2022-08-26 ENCOUNTER — Encounter: Payer: Self-pay | Admitting: Dietician

## 2022-08-26 ENCOUNTER — Encounter: Payer: 59 | Attending: General Surgery | Admitting: Dietician

## 2022-08-26 VITALS — Ht 68.0 in | Wt 294.8 lb

## 2022-08-26 DIAGNOSIS — E669 Obesity, unspecified: Secondary | ICD-10-CM

## 2022-08-26 NOTE — Progress Notes (Signed)
Supervised Weight Loss Visit Bariatric Nutrition Education Appt Start Time: 4:52    End Time: 5:15  Planned surgery: RYGB Pt expectation of surgery: to lose at least 150 lbs., to be able to go up and down steps, and get off blood pressure medication  3 out of 3 SWL Appointments   Pt completed visits.   Pt has cleared nutrition requirements.    NUTRITION ASSESSMENT   Anthropometrics  Start weight at [facility]: 304.3 lbs (date: 06/13/2022)  Height: 68 in. Weight today: 294.8 lbs.  BMI: 44.82 kg/m2     Clinical   Pharmacotherapy: History of weight loss medication used: phentermine, tompiramate   Medical hx: myocardial infarction, HTN, anemia, anxiety, obesity, hypercholesterolemia Medications: lasix, aspirin, xanax, diltiazem, K, estritol patch, spironolactone Labs: HDL  Notable signs/symptoms: none noted Any previous deficiencies? No  Lifestyle & Dietary Hx  Pt states she has confirmed that she is doing the by-pass surgery. Pt states she is counting her chews when eating, stating she is slowing down when eating and separating fluids from meals and snacks. Pt states she has been more aware and mindful of what she is eating, and paying attention to the nutrition facts. Pt states she wants to focus more on physical activity as she loses weight. Pt states she has a new toy, stating it is vibration plate.  Pt states she has a row machine and hula hoop machine. Pt states she has increase her water intake, stating she has also decrease the amount of water flavorings she uses. Pt states she just needs a sprinkle of the powder now.  Estimated daily fluid intake: 64 oz Supplements: none Current average weekly physical activity: 10-15 minutes daily on treadmill, trying light weights and pedals under her desk while she works.  24-Hr Dietary Recall First Meal: oatmeal, grapes  Snack:  Second Meal: cuties (tangerines) or cantaloupe, almonds (honey roasted), cheese sticks, beef  jerky Snack:  Third Meal: salad with chicken or chicken with vegetables or salmon Snack: larna doon shortbread cookies Beverages: water, water with flavorings  Estimated Energy Needs Calories: 1500  NUTRITION DIAGNOSIS  Overweight/obesity (Finley-3.3) related to past poor dietary habits and physical inactivity as evidenced by patient w/ planned sleeve surgery following dietary guidelines for continued weight loss.  NUTRITION INTERVENTION  Nutrition counseling (C-1) and education (E-2) to facilitate bariatric surgery goals.  Purpose of hydration: Water makes up over 50% of your total body water, and is part of many organs throughout the body. Water is essential to transport digested nutrients, regulate body temperature, rid the body of waste products, and protects joints and the spinal cord. When not properly hydrated you will begin to experience headaches, cramps and dizziness. Further dehydration can result in rapid heart rate, shock, oliguria, and may cause seizures.  https://www.merckmanuals.com/home/hormonal-and-metabolic-disordehttps://www.usgs.gov/special-topic/water-science-school/science/water-you-water-and-human-body?qt-science_center_objects=0#qt-science_center_objectsrs/water-balance/about-body-water FriendLock.it InvestmentInstructor.com.cy FurEliminator.es https://www.health.BasicFM.no Encouraged patient to honor their body's internal hunger and fullness cues.  Throughout the day, check in mentally and rate hunger. Stop eating when satisfied not full regardless of how much food is left on the plate.  Get more if still hungry 20-30 minutes later.  The key is to honor satisfaction so throughout the meal, rate fullness factor and stop when comfortably satisfied not physically full. The key is  to honor hunger and fullness without any feelings of guilt or shame.  Pay attention to what the internal cues are, rather than any external factors. This will enhance the confidence you have in listening to your own body and following those internal cues enabling you to increase  how often you eat when you are hungry not out of appetite and stop when you are satisfied not full.  Encouraged pt to continue to eat balanced meals inclusive of non starchy vegetables 2 times a day 7 days a week Encouraged pt to choose lean protein sources: limiting beef, pork, sausage, hotdogs, and lunch meat Encourage pt to choose healthy fats such as plant based limiting animal fats Encouraged pt to continue to drink a minium 64 fluid ounces with half being plain water to satisfy proper hydration    Pre-Op Goals Progress & New Goals Continue: Avoid skipping meals; aim to eat every 3-5 hours Continue: Eat slow, chew well; aim for 20-30 minutes per meal/snack Continue: practice not drinking with meals Continue: avoid sugar sweetened beverages Continue: track fluid; aim for 64 oz of fluids, mostly plain water Continue: Aim for satisfaction before fullness  Handouts Provided Include   Learning Style & Readiness for Change Teaching method utilized: Visual & Auditory  Demonstrated degree of understanding via: Teach Back  Readiness Level: preparation Barriers to learning/adherence to lifestyle change: nothing identified  RD's Notes for next Visit  Patient progress toward chosen goals.  MONITORING & EVALUATION Dietary intake, weekly physical activity, body weight, and pre-op goals in 1 month.   Next Steps  Pt has completed visits. No further supervised visits required/recommended. Patient is to return to [facility] for pre-op class >2 weeks prior to scheduled surgery.

## 2022-09-03 ENCOUNTER — Encounter: Payer: Self-pay | Admitting: Gastroenterology

## 2022-09-04 ENCOUNTER — Ambulatory Visit (HOSPITAL_COMMUNITY): Payer: 59

## 2022-09-16 ENCOUNTER — Ambulatory Visit
Admission: RE | Admit: 2022-09-16 | Discharge: 2022-09-16 | Disposition: A | Discharge status: Home / Self Care | Payer: 59 | Source: Ambulatory Visit | Attending: Obstetrics | Admitting: Obstetrics

## 2022-09-16 DIAGNOSIS — E2839 Other primary ovarian failure: Secondary | ICD-10-CM

## 2022-09-23 ENCOUNTER — Encounter: Payer: 59 | Attending: General Surgery | Admitting: Skilled Nursing Facility1

## 2022-09-23 ENCOUNTER — Encounter: Payer: Self-pay | Admitting: Skilled Nursing Facility1

## 2022-09-23 NOTE — Progress Notes (Unsigned)
Pre-Operative Nutrition Class:    Patient was seen on 09/23/2022 for Pre-Operative Bariatric Surgery Education at [facility].    Surgery date:  Surgery type: RYGB Start weight at [facility]: 304 Weight today: 298.6  Samples given per MNT protocol. Patient educated on appropriate usage: procare Multivitamin Lot # [id number] Exp:06/26  Procare Calcium  Lot # [id number] Exp: 03/31/2023   The following the learning objectives were met by the patient during this course: Identify Pre-Op Dietary Goals and will begin 2 weeks pre-operatively Identify appropriate sources of fluids and proteins  State protein recommendations and appropriate sources pre and post-operatively Identify Post-Operative Dietary Goals and will follow for 2 weeks post-operatively Identify appropriate multivitamin and calcium sources Describe the need for physical activity post-operatively and will follow MD recommendations State when to call healthcare provider regarding medication questions or post-operative complications When having a diagnosis of diabetes understanding hypoglycemia symptoms and the inclusion of 1 complex carbohydrate per meal  Handouts given during class include: Pre-Op Bariatric Surgery Diet Handout Protein Shake Handout Post-Op Bariatric Surgery Nutrition Handout BELT Program Information Flyer Support Group Information Flyer [facility] Bariatric Supplements Price List  Follow-Up Plan: Patient will follow-up at [facility] 2 weeks post operatively for diet advancement per MD.

## 2022-10-07 NOTE — Progress Notes (Signed)
Sent message, via epic in basket, requesting orders in epic from surgeon.  

## 2022-10-14 NOTE — Patient Instructions (Signed)
SURGICAL WAITING ROOM VISITATION  Patients having surgery or a procedure may have no more than 2 support people in the waiting area - these visitors may rotate.    Children under the age of 57 must have an adult with them who is not the patient.  Due to an increase in RSV and influenza rates and associated hospitalizations, children ages 24 and under may not visit patients in [facility] hospitals.  If the patient needs to stay at the hospital during part of their recovery, the visitor guidelines for inpatient rooms apply. Pre-op nurse will coordinate an appropriate time for 1 support person to accompany patient in pre-op.  This support person may not rotate.    Please refer to the [facility] website for the visitor guidelines for Inpatients (after your surgery is over and you are in a regular room).       Your procedure is scheduled on:  10/21/2022    Report to [facility] Main Entrance    Report to admitting at  0515   AM   Call this number if you have problems the morning of surgery [phone]   Do not eat food :After Midnight.   After Midnight you may have the following liquids until ______ AM/ PM DAY OF SURGERY  Water Non-Citrus Juices (without pulp, NO RED-Apple, White grape, White cranberry) Black Coffee (NO MILK/CREAM OR CREAMERS, sugar ok)  Clear Tea (NO MILK/CREAM OR CREAMERS, sugar ok) regular and decaf                             Plain Jell-O (NO RED)                                           Fruit ices (not with fruit pulp, NO RED)                                     Popsicles (NO RED)                                                               Sports drinks like Gatorade (NO RED)              Drink 2 Ensure/G2 drinks AT 10:00 PM the night before surgery.        The day of surgery:  Drink ONE (1) Pre-Surgery Clear Ensure or G2 at AM the morning of surgery. Drink in one sitting. Do not sip.  This drink was given to you during your hospital  pre-op  appointment visit. Nothing else to drink after completing the  Pre-Surgery Clear Ensure or G2.          If you have questions, please contact your surgeon's office.   FOLLOW BOWEL PREP AND ANY ADDITIONAL PRE OP INSTRUCTIONS YOU RECEIVED FROM YOUR SURGEON'S OFFICE!!!     Oral Hygiene is also important to reduce your risk of infection.  Remember - BRUSH YOUR TEETH THE MORNING OF SURGERY WITH YOUR REGULAR TOOTHPASTE  DENTURES WILL BE REMOVED PRIOR TO SURGERY PLEASE DO NOT APPLY "Poly grip" OR ADHESIVES!!!   Do NOT smoke after Midnight   Take these medicines the morning of surgery with A SIP OF WATER:  cardizem, protonix   DO NOT TAKE ANY ORAL DIABETIC MEDICATIONS DAY OF YOUR SURGERY  Bring CPAP mask and tubing day of surgery.                              You may not have any metal on your body including hair pins, jewelry, and body piercing             Do not wear make-up, lotions, powders, perfumes/cologne, or deodorant  Do not wear nail polish including gel and S&S, artificial/acrylic nails, or any other type of covering on natural nails including finger and toenails. If you have artificial nails, gel coating, etc. that needs to be removed by a nail salon please have this removed prior to surgery or surgery may need to be canceled/ delayed if the surgeon/ anesthesia feels like they are unable to be safely monitored.   Do not shave  48 hours prior to surgery.               Men may shave face and neck.   Do not bring valuables to the hospital. [facility] IS NOT             RESPONSIBLE   FOR VALUABLES.   Contacts, glasses, dentures or bridgework may not be worn into surgery.   Bring small overnight bag day of surgery.   DO NOT BRING YOUR HOME MEDICATIONS TO THE HOSPITAL. PHARMACY WILL DISPENSE MEDICATIONS LISTED ON YOUR MEDICATION LIST TO YOU DURING YOUR ADMISSION IN THE HOSPITAL!    Patients discharged on the day of surgery will not be  allowed to drive home.  Someone NEEDS to stay with you for the first 24 hours after anesthesia.   Special Instructions: Bring a copy of your healthcare power of attorney and living will documents the day of surgery if you haven't scanned them before.              Please read over the following fact sheets you were given: IF YOU HAVE QUESTIONS ABOUT YOUR PRE-OP INSTRUCTIONS PLEASE CALL [phone]   If you received a COVID test during your pre-op visit  it is requested that you wear a mask when out in public, stay away from anyone that may not be feeling well and notify your surgeon if you develop symptoms. If you test positive for Covid or have been in contact with anyone that has tested positive in the last 10 days please notify you surgeon.    [facility] - Preparing for Surgery Before surgery, you can play an important role.  Because skin is not sterile, your skin needs to be as free of germs as possible.  You can reduce the number of germs on your skin by washing with CHG (chlorahexidine gluconate) soap before surgery.  CHG is an antiseptic cleaner which kills germs and bonds with the skin to continue killing germs even after washing. Please DO NOT use if you have an allergy to CHG or antibacterial soaps.  If your skin becomes reddened/irritated stop using the CHG and inform your nurse when you arrive at Short Stay. Do not shave (including legs and underarms)  for at least 48 hours prior to the first CHG shower.  You may shave your face/neck. Please follow these instructions carefully:  1.  Shower with CHG Soap the night before surgery and the  morning of Surgery.  2.  If you choose to wash your hair, wash your hair first as usual with your  normal  shampoo.  3.  After you shampoo, rinse your hair and body thoroughly to remove the  shampoo.                           4.  Use CHG as you would any other liquid soap.  You can apply chg directly  to the skin and wash                       Gently with  a scrungie or clean washcloth.  5.  Apply the CHG Soap to your body ONLY FROM THE NECK DOWN.   Do not use on face/ open                           Wound or open sores. Avoid contact with eyes, ears mouth and genitals (private parts).                       Wash face,  Genitals (private parts) with your normal soap.             6.  Wash thoroughly, paying special attention to the area where your surgery  will be performed.  7.  Thoroughly rinse your body with warm water from the neck down.  8.  DO NOT shower/wash with your normal soap after using and rinsing off  the CHG Soap.                9.  Keary yourself dry with a clean towel.            10.  Wear clean pajamas.            11.  Place clean sheets on your bed the night of your first shower and do not  sleep with pets. Day of Surgery : Do not apply any lotions/deodorants the morning of surgery.  Please wear clean clothes to the hospital/surgery center.  FAILURE TO FOLLOW THESE INSTRUCTIONS MAY RESULT IN THE CANCELLATION OF YOUR SURGERY PATIENT SIGNATURE_________________________________  NURSE SIGNATURE__________________________________  ________________________________________________________________________

## 2022-10-14 NOTE — Progress Notes (Signed)
Anesthesia Review:  PCP: Cardiologist : Dietrich Pates- LOv 07/18/22  Chest x-ray : 05/28/22- 2 view  EKG : 07/18/22  Echo : 2019  Stress test: 2020  Cardiac Cath : 2019   Activity level:  Sleep Study/ CPAP : Fasting Blood Sugar :      / Checks Blood Sugar -- times a day:   Blood Thinner/ Instructions /Last Dose: ASA / Instructions/ Last Dose :

## 2022-10-15 ENCOUNTER — Other Ambulatory Visit: Payer: Self-pay

## 2022-10-15 ENCOUNTER — Encounter (HOSPITAL_COMMUNITY)
Admission: RE | Admit: 2022-10-15 | Discharge: 2022-10-15 | Disposition: A | Discharge status: Home / Self Care | Payer: 59 | Source: Ambulatory Visit | Attending: General Surgery | Admitting: General Surgery

## 2022-10-15 ENCOUNTER — Encounter (HOSPITAL_COMMUNITY): Payer: Self-pay

## 2022-10-15 VITALS — BP 130/93 | HR 78 | Temp 98.0°F | Resp 16 | Ht 68.0 in | Wt 284.0 lb

## 2022-10-15 DIAGNOSIS — Z6841 Body Mass Index (BMI) 40.0 and over, adult: Secondary | ICD-10-CM | POA: Diagnosis not present

## 2022-10-15 DIAGNOSIS — Z01812 Encounter for preprocedural laboratory examination: Secondary | ICD-10-CM | POA: Insufficient documentation

## 2022-10-15 DIAGNOSIS — I252 Old myocardial infarction: Secondary | ICD-10-CM | POA: Insufficient documentation

## 2022-10-15 DIAGNOSIS — I5181 Takotsubo syndrome: Secondary | ICD-10-CM | POA: Insufficient documentation

## 2022-10-15 DIAGNOSIS — I10 Essential (primary) hypertension: Secondary | ICD-10-CM | POA: Diagnosis not present

## 2022-10-15 DIAGNOSIS — Z01818 Encounter for other preprocedural examination: Secondary | ICD-10-CM

## 2022-10-15 HISTORY — DX: Prediabetes: R73.03

## 2022-10-15 HISTORY — DX: Gastro-esophageal reflux disease without esophagitis: K21.9

## 2022-10-15 LAB — COMPREHENSIVE METABOLIC PANEL
ALT: 26 U/L (ref 0–44)
AST: 26 U/L (ref 15–41)
Albumin: 4.5 g/dL (ref 3.5–5.0)
Alkaline Phosphatase: 62 U/L (ref 38–126)
Anion gap: 9 (ref 5–15)
BUN: 19 mg/dL (ref 6–20)
CO2: 26 mmol/L (ref 22–32)
Calcium: 9.6 mg/dL (ref 8.9–10.3)
Chloride: 102 mmol/L (ref 98–111)
Creatinine, Ser: 1.15 mg/dL — ABNORMAL HIGH (ref 0.44–1.00)
GFR, Estimated: 57 mL/min — ABNORMAL LOW (ref >60)
Glucose, Bld: 90 mg/dL (ref 70–99)
Potassium: 4.1 mmol/L (ref 3.5–5.1)
Sodium: 137 mmol/L (ref 135–145)
Total Bilirubin: 0.6 mg/dL (ref 0.3–1.2)
Total Protein: 8.5 g/dL — ABNORMAL HIGH (ref 6.5–8.1)

## 2022-10-15 LAB — CBC
HCT: 46.5 % — ABNORMAL HIGH (ref 36.0–46.0)
Hemoglobin: 15 g/dL (ref 12.0–15.0)
MCH: 29.6 pg (ref 26.0–34.0)
MCHC: 32.3 g/dL (ref 30.0–36.0)
MCV: 91.7 fL (ref 80.0–100.0)
Platelets: 337 10*3/uL (ref 150–400)
RBC: 5.07 MIL/uL (ref 3.87–5.11)
RDW: 13.7 % (ref 11.5–15.5)
WBC: 7.1 10*3/uL (ref 4.0–10.5)
nRBC: 0 % (ref 0.0–0.2)

## 2022-10-16 NOTE — Anesthesia Preprocedure Evaluation (Addendum)
Anesthesia Evaluation  Patient identified by MRN, date of birth, ID band Patient awake    Reviewed: Allergy & Precautions, NPO status , Patient's Chart, lab work & pertinent test results  History of Anesthesia Complications Negative for: history of anesthetic complications  Airway Mallampati: II  TM Distance: >3 FB Neck ROM: Full    Dental  (+) Dental Advisory Given, Chipped   Pulmonary neg pulmonary ROS   Pulmonary exam normal        Cardiovascular hypertension, Pt. on medications + Past MI  Normal cardiovascular exam   Hx Takotsubo CM  '21 Myoperfusion - Normal EF. Low risk study    Neuro/Psych  PSYCHIATRIC DISORDERS Anxiety     negative neurological ROS     GI/Hepatic Neg liver ROS,GERD  Medicated and Controlled,,  Endo/Other    Morbid obesity Pre-DM   Renal/GU negative Renal ROS     Musculoskeletal negative musculoskeletal ROS (+)    Abdominal  (+) + obese  Peds  Hematology negative hematology ROS (+)   Anesthesia Other Findings   Reproductive/Obstetrics                             Anesthesia Physical Anesthesia Plan  ASA: 3  Anesthesia Plan: General   Post-op Pain Management: Tylenol PO (pre-op)*, Ketamine IV* and Lidocaine infusion*   Induction: Intravenous  PONV Risk Score and Plan: 4 or greater and Treatment may vary due to age or medical condition, Ondansetron, Dexamethasone, Midazolam and Scopolamine patch - Pre-op  Airway Management Planned: Oral ETT  Additional Equipment: None  Intra-op Plan:   Post-operative Plan: Extubation in OR  Informed Consent: I have reviewed the patients History and Physical, chart, labs and discussed the procedure including the risks, benefits and alternatives for the proposed anesthesia with the patient or authorized representative who has indicated his/her understanding and acceptance.     Dental advisory given  Plan  Discussed with: CRNA and Anesthesiologist  Anesthesia Plan Comments: (See [place] note )       Anesthesia Quick Evaluation

## 2022-10-16 NOTE — Progress Notes (Signed)
Anesthesia Chart Review   Case: [id number] Date/Time: 10/21/22 0715   Procedures:      LAPAROSCOPIC ROUX-EN-Y GASTRIC BYPASS WITH UPPER ENDOSCOPY     HERNIA REPAIR HIATAL   Anesthesia type: General   Pre-op diagnosis: MORBID OBESITY   Location: [facility] ROOM 02 / [facility]   Surgeons: Gaynelle Adu, MD       DISCUSSION:57 y.o. never smoker with h/o HTN, NSTEMI 2019, Takotsubo cardiomyopathy, morbid obesity scheduled for above procedure 10/21/2022 with Dr. Gaynelle Adu.   Pt last seen by cardiology 07/18/2022 for preoperative evaluation.  Per OV note, "Frm a cardiac standpoint I think she is low risk for a major cardiac event   OK to proceed"  VS: BP (!) 130/93   Pulse 78   Temp 36.7 C (Oral)   Resp 16   Ht 5\' 8"  (1.727 m)   Wt 128.8 kg   SpO2 96%   BMI 43.18 kg/m   PROVIDERS: Salli Real, MD is PCP   Cardiologist : Dietrich Pates, MD  LABS: Labs reviewed: Acceptable for surgery. (all labs ordered are listed, but only abnormal results are displayed)  Labs Reviewed  CBC - Abnormal; Notable for the following components:      Result Value   HCT 46.5 (*)    All other components within normal limits  COMPREHENSIVE METABOLIC PANEL - Abnormal; Notable for the following components:   Creatinine, Ser 1.15 (*)    Total Protein 8.5 (*)    GFR, Estimated 57 (*)    All other components within normal limits  TYPE AND SCREEN     IMAGES:   EKG:   CV: Myocardial Perfusion 12/30/2019 Nuclear stress EF: 68%. No T wave inversion was noted during stress. There was no ST segment deviation noted during stress. This is a low risk study.   No reversible ischemia. LVEF 68% with normal wall motion. This is a low risk study.   Echo 03/03/2018 - Left ventricle: The cavity size was normal. There was mild focal    basal hypertrophy of the septum. Systolic function was normal.    The estimated ejection fraction was in the range of 60% to 65%.    Wall motion was normal; there were no regional wall  motion    abnormalities. Doppler parameters are consistent with abnormal    left ventricular relaxation (grade 1 diastolic dysfunction).  - Aortic valve: Transvalvular velocity was within the normal range.    There was no stenosis. There was no regurgitation.  - Mitral valve: Transvalvular velocity was within the normal range.    There was no evidence for stenosis. There was no regurgitation.  - Right ventricle: The cavity size was normal. Wall thickness was    normal. Systolic function was normal.  - Atrial septum: No defect or patent foramen ovale was identified    by color flow Doppler.  - Tricuspid valve: There was no regurgitation.   Impressions:   - Apical akinesis no longer present.  Past Medical History:  Diagnosis Date   Acute heart failure (HCC)    Anxiety    Blood transfusion without reported diagnosis 2014   Chest pain    Myoview 9/21: EF 68, no ischemia; low risk   GERD (gastroesophageal reflux disease)    Hyperlipidemia    on meds   Hypertension    on meds   NSTEMI (non-ST elevated myocardial infarction) (HCC) 2019   Pre-diabetes    Takotsubo cardiomyopathy     Past Surgical History:  Procedure  Laterality Date   ABDOMINAL HYSTERECTOMY  2016   BACK SURGERY  2002   CARDIAC CATHETERIZATION  02/17/2018   COLONOSCOPY  2007   ? normal per pt- done due to eating lead and nacho chips   COLONOSCOPY  2021   KN-MAC-prep good-tics/int hems   COLPORRHAPHY  10/24/2014   Posterior, with uterosacral ligament suspension   LEFT HEART CATH AND CORONARY ANGIOGRAPHY N/A 02/25/2018   Procedure: LEFT HEART CATH AND CORONARY ANGIOGRAPHY;  Surgeon: Lyn Records, MD;  Location: [facility] INVASIVE CV LAB;  Service: Cardiovascular;  Laterality: N/A;   TRACHELECTOMY  10/24/2014   removed cervix   TUBAL LIGATION  1990   ULTRASOUND GUIDANCE FOR VASCULAR ACCESS  02/25/2018   Procedure: Ultrasound Guidance For Vascular Access;  Surgeon: Lyn Records, MD;  Location: [facility] INVASIVE CV LAB;   Service: Cardiovascular;;    MEDICATIONS:  ALPRAZolam (XANAX) 0.25 MG tablet   amitriptyline (ELAVIL) 25 MG tablet   aspirin 81 MG chewable tablet   Calcium Carbonate-Vitamin D (CALCIUM 600+D PO)   diltiazem (CARDIZEM CD) 120 MG 24 hr capsule   estradiol (CLIMARA) 0.1 mg/24hr patch   furosemide (LASIX) 20 MG tablet   ibuprofen (ADVIL) 800 MG tablet   Magnesium Hydroxide (MILK OF MAGNESIA PO)   metroNIDAZOLE (METROGEL) 0.75 % vaginal gel   Multiple Vitamins-Minerals (BARIATRIC FUSION PO)   nitroGLYCERIN (NITROSTAT) 0.4 MG SL tablet   pantoprazole (PROTONIX) 40 MG tablet   potassium chloride (KLOR-CON) 10 MEQ tablet   spironolactone (ALDACTONE) 25 MG tablet    0.9 %  sodium chloride infusion   Lajoy Vanamburg Ward, PA-C [facility] Pre-Surgical Testing [phone]

## 2022-10-21 ENCOUNTER — Encounter (HOSPITAL_COMMUNITY)
Admission: RE | Disposition: A | Discharge status: Home / Self Care | Payer: Self-pay | Source: Ambulatory Visit | Attending: General Surgery

## 2022-10-21 ENCOUNTER — Ambulatory Visit (HOSPITAL_COMMUNITY): Payer: 59 | Admitting: Physician Assistant

## 2022-10-21 ENCOUNTER — Encounter (HOSPITAL_COMMUNITY): Payer: Self-pay | Admitting: General Surgery

## 2022-10-21 ENCOUNTER — Ambulatory Visit (HOSPITAL_BASED_OUTPATIENT_CLINIC_OR_DEPARTMENT_OTHER): Payer: 59 | Admitting: Anesthesiology

## 2022-10-21 ENCOUNTER — Other Ambulatory Visit: Payer: Self-pay

## 2022-10-21 ENCOUNTER — Ambulatory Visit (HOSPITAL_COMMUNITY)
Admission: RE | Admit: 2022-10-21 | Discharge: 2022-10-22 | Disposition: A | Discharge status: Home / Self Care | Payer: 59 | Source: Ambulatory Visit | Attending: General Surgery | Admitting: General Surgery

## 2022-10-21 DIAGNOSIS — Z9884 Bariatric surgery status: Secondary | ICD-10-CM

## 2022-10-21 DIAGNOSIS — Z79899 Other long term (current) drug therapy: Secondary | ICD-10-CM | POA: Insufficient documentation

## 2022-10-21 DIAGNOSIS — E785 Hyperlipidemia, unspecified: Secondary | ICD-10-CM | POA: Diagnosis not present

## 2022-10-21 DIAGNOSIS — I252 Old myocardial infarction: Secondary | ICD-10-CM | POA: Insufficient documentation

## 2022-10-21 DIAGNOSIS — M25561 Pain in right knee: Secondary | ICD-10-CM | POA: Insufficient documentation

## 2022-10-21 DIAGNOSIS — Z8679 Personal history of other diseases of the circulatory system: Secondary | ICD-10-CM | POA: Diagnosis not present

## 2022-10-21 DIAGNOSIS — K449 Diaphragmatic hernia without obstruction or gangrene: Secondary | ICD-10-CM | POA: Diagnosis not present

## 2022-10-21 DIAGNOSIS — I5043 Acute on chronic combined systolic (congestive) and diastolic (congestive) heart failure: Secondary | ICD-10-CM | POA: Diagnosis not present

## 2022-10-21 DIAGNOSIS — M1612 Unilateral primary osteoarthritis, left hip: Secondary | ICD-10-CM | POA: Diagnosis not present

## 2022-10-21 DIAGNOSIS — Z90711 Acquired absence of uterus with remaining cervical stump: Secondary | ICD-10-CM | POA: Diagnosis not present

## 2022-10-21 DIAGNOSIS — I110 Hypertensive heart disease with heart failure: Secondary | ICD-10-CM | POA: Insufficient documentation

## 2022-10-21 DIAGNOSIS — K2100 Gastro-esophageal reflux disease with esophagitis, without bleeding: Secondary | ICD-10-CM | POA: Diagnosis not present

## 2022-10-21 DIAGNOSIS — R7303 Prediabetes: Secondary | ICD-10-CM | POA: Insufficient documentation

## 2022-10-21 DIAGNOSIS — G8929 Other chronic pain: Secondary | ICD-10-CM | POA: Diagnosis not present

## 2022-10-21 DIAGNOSIS — Z6841 Body Mass Index (BMI) 40.0 and over, adult: Secondary | ICD-10-CM | POA: Insufficient documentation

## 2022-10-21 DIAGNOSIS — M25562 Pain in left knee: Secondary | ICD-10-CM | POA: Insufficient documentation

## 2022-10-21 DIAGNOSIS — Z01818 Encounter for other preprocedural examination: Secondary | ICD-10-CM

## 2022-10-21 HISTORY — PX: HIATAL HERNIA REPAIR: SHX195

## 2022-10-21 HISTORY — PX: GASTRIC ROUX-EN-Y: SHX5262

## 2022-10-21 LAB — HEMOGLOBIN AND HEMATOCRIT, BLOOD
HCT: 45.3 % (ref 36.0–46.0)
Hemoglobin: 14.5 g/dL (ref 12.0–15.0)

## 2022-10-21 LAB — TYPE AND SCREEN
ABO/RH(D): B POS
Antibody Screen: NEGATIVE

## 2022-10-21 SURGERY — LAPAROSCOPIC ROUX-EN-Y GASTRIC BYPASS WITH UPPER ENDOSCOPY
Anesthesia: General

## 2022-10-21 MED ORDER — SUGAMMADEX SODIUM 200 MG/2ML IV SOLN
INTRAVENOUS | Status: DC | PRN
  Administered 2022-10-21: 300 mg via INTRAVENOUS

## 2022-10-21 MED ORDER — HEPARIN SODIUM (PORCINE) 5000 UNIT/ML IJ SOLN
5000.0000 [IU] | Freq: Once | INTRAMUSCULAR | Status: AC
  Administered 2022-10-21: 5000 [IU] via SUBCUTANEOUS
  Filled 2022-10-21: qty 1

## 2022-10-21 MED ORDER — APREPITANT 40 MG PO CAPS
40.0000 mg | ORAL_CAPSULE | Freq: Once | ORAL | Status: AC
  Administered 2022-10-21: 40 mg via ORAL
  Filled 2022-10-21: qty 1

## 2022-10-21 MED ORDER — ROCURONIUM BROMIDE 10 MG/ML (PF) SYRINGE
PREFILLED_SYRINGE | INTRAVENOUS | Status: DC | PRN
  Administered 2022-10-21: 100 mg via INTRAVENOUS

## 2022-10-21 MED ORDER — ONDANSETRON HCL 4 MG/2ML IJ SOLN
INTRAMUSCULAR | Status: AC
  Filled 2022-10-21: qty 2

## 2022-10-21 MED ORDER — DILTIAZEM HCL 30 MG PO TABS
30.0000 mg | ORAL_TABLET | Freq: Four times a day (QID) | ORAL | Status: DC
  Administered 2022-10-21 – 2022-10-22 (×5): 30 mg via ORAL
  Filled 2022-10-21 (×5): qty 1

## 2022-10-21 MED ORDER — SCOPOLAMINE 1 MG/3DAYS TD PT72
MEDICATED_PATCH | TRANSDERMAL | Status: DC | PRN
  Administered 2022-10-21: 1 via TRANSDERMAL

## 2022-10-21 MED ORDER — BUPIVACAINE-EPINEPHRINE 0.25% -1:200000 IJ SOLN
INTRAMUSCULAR | Status: AC
  Filled 2022-10-21: qty 1

## 2022-10-21 MED ORDER — ORAL CARE MOUTH RINSE: 15.0000 mL | Freq: Once | OROMUCOSAL | Status: AC

## 2022-10-21 MED ORDER — ONDANSETRON HCL 4 MG/2ML IJ SOLN: 4.0000 mg | Freq: Four times a day (QID) | INTRAMUSCULAR | Status: DC | PRN

## 2022-10-21 MED ORDER — ACETAMINOPHEN 160 MG/5ML PO SOLN: 1000.0000 mg | Freq: Three times a day (TID) | ORAL | Status: DC

## 2022-10-21 MED ORDER — DEXAMETHASONE SODIUM PHOSPHATE 10 MG/ML IJ SOLN
INTRAMUSCULAR | Status: AC
  Filled 2022-10-21: qty 1

## 2022-10-21 MED ORDER — BUPIVACAINE-EPINEPHRINE 0.25% -1:200000 IJ SOLN
INTRAMUSCULAR | Status: DC | PRN
  Administered 2022-10-21: 30 mL

## 2022-10-21 MED ORDER — OXYCODONE HCL 5 MG/5ML PO SOLN
5.0000 mg | Freq: Once | ORAL | Status: AC | PRN
  Administered 2022-10-21: 5 mg via ORAL

## 2022-10-21 MED ORDER — LACTATED RINGERS IR SOLN
Status: DC | PRN
  Administered 2022-10-21: 1000 mL

## 2022-10-21 MED ORDER — KETAMINE HCL 50 MG/5ML IJ SOSY
PREFILLED_SYRINGE | INTRAMUSCULAR | Status: AC
  Filled 2022-10-21: qty 5

## 2022-10-21 MED ORDER — 0.9 % SODIUM CHLORIDE (POUR BTL) OPTIME
TOPICAL | Status: DC | PRN
  Administered 2022-10-21: 1000 mL

## 2022-10-21 MED ORDER — PROMETHAZINE HCL 25 MG/ML IJ SOLN: 6.2500 mg | INTRAMUSCULAR | Status: DC | PRN

## 2022-10-21 MED ORDER — GLYCOPYRROLATE 0.2 MG/ML IJ SOLN
INTRAMUSCULAR | Status: DC | PRN
  Administered 2022-10-21: .2 mg via INTRAVENOUS

## 2022-10-21 MED ORDER — SCOPOLAMINE 1 MG/3DAYS TD PT72
MEDICATED_PATCH | TRANSDERMAL | Status: AC
  Filled 2022-10-21: qty 1

## 2022-10-21 MED ORDER — KCL IN DEXTROSE-NACL 20-5-0.45 MEQ/L-%-% IV SOLN
INTRAVENOUS | Status: DC
  Filled 2022-10-21 (×3): qty 1000

## 2022-10-21 MED ORDER — ACETAMINOPHEN 500 MG PO TABS
1000.0000 mg | ORAL_TABLET | Freq: Once | ORAL | Status: AC
  Administered 2022-10-21: 1000 mg via ORAL
  Filled 2022-10-21: qty 2

## 2022-10-21 MED ORDER — FENTANYL CITRATE PF 50 MCG/ML IJ SOSY
25.0000 ug | PREFILLED_SYRINGE | INTRAMUSCULAR | Status: DC | PRN
  Administered 2022-10-21 (×2): 50 ug via INTRAVENOUS

## 2022-10-21 MED ORDER — MIDAZOLAM HCL 2 MG/2ML IJ SOLN
INTRAMUSCULAR | Status: AC
  Filled 2022-10-21: qty 2

## 2022-10-21 MED ORDER — MIDAZOLAM HCL 5 MG/5ML IJ SOLN
INTRAMUSCULAR | Status: DC | PRN
  Administered 2022-10-21: 2 mg via INTRAVENOUS

## 2022-10-21 MED ORDER — BUPIVACAINE LIPOSOME 1.3 % IJ SUSP
INTRAMUSCULAR | Status: AC
  Filled 2022-10-21: qty 20

## 2022-10-21 MED ORDER — FENTANYL CITRATE (PF) 250 MCG/5ML IJ SOLN
INTRAMUSCULAR | Status: AC
  Filled 2022-10-21: qty 5

## 2022-10-21 MED ORDER — PHENYLEPHRINE 80 MCG/ML (10ML) SYRINGE FOR IV PUSH (FOR BLOOD PRESSURE SUPPORT)
PREFILLED_SYRINGE | INTRAVENOUS | Status: DC | PRN
  Administered 2022-10-21: 160 ug via INTRAVENOUS
  Administered 2022-10-21 (×3): 120 ug via INTRAVENOUS

## 2022-10-21 MED ORDER — HEPARIN SODIUM (PORCINE) 5000 UNIT/ML IJ SOLN
5000.0000 [IU] | Freq: Three times a day (TID) | INTRAMUSCULAR | Status: DC
  Administered 2022-10-21 – 2022-10-22 (×3): 5000 [IU] via SUBCUTANEOUS
  Filled 2022-10-21 (×3): qty 1

## 2022-10-21 MED ORDER — HYDROMORPHONE HCL 1 MG/ML IJ SOLN
0.5000 mg | INTRAMUSCULAR | Status: DC | PRN
  Administered 2022-10-21: 0.5 mg via INTRAVENOUS

## 2022-10-21 MED ORDER — SODIUM CHLORIDE 0.9 % IV SOLN
2.0000 g | Freq: Once | INTRAVENOUS | Status: AC
  Administered 2022-10-21: 2 g via INTRAVENOUS
  Filled 2022-10-21: qty 2

## 2022-10-21 MED ORDER — FENTANYL CITRATE PF 50 MCG/ML IJ SOSY
PREFILLED_SYRINGE | INTRAMUSCULAR | Status: AC
  Administered 2022-10-21: 50 ug via INTRAVENOUS
  Filled 2022-10-21: qty 2

## 2022-10-21 MED ORDER — KETAMINE HCL 10 MG/ML IJ SOLN
INTRAMUSCULAR | Status: DC | PRN
  Administered 2022-10-21: 35 mg via INTRAVENOUS

## 2022-10-21 MED ORDER — ENSURE MAX PROTEIN PO LIQD
2.0000 [oz_av] | ORAL | Status: DC
  Administered 2022-10-22 (×5): 2 [oz_av] via ORAL

## 2022-10-21 MED ORDER — SIMETHICONE 80 MG PO CHEW
80.0000 mg | CHEWABLE_TABLET | Freq: Four times a day (QID) | ORAL | Status: DC | PRN
  Administered 2022-10-21 – 2022-10-22 (×4): 80 mg via ORAL
  Filled 2022-10-21 (×4): qty 1

## 2022-10-21 MED ORDER — "VISTASEAL 4 ML SINGLE DOSE KIT "
4.0000 mL | PACK | Freq: Once | CUTANEOUS | Status: AC
  Administered 2022-10-21: 4 mL via TOPICAL
  Filled 2022-10-21: qty 4

## 2022-10-21 MED ORDER — LIDOCAINE 2% (20 MG/ML) 5 ML SYRINGE
INTRAMUSCULAR | Status: DC | PRN
  Administered 2022-10-21: 1.5 mg/kg/h via INTRAVENOUS
  Administered 2022-10-21: 100 mg via INTRAVENOUS

## 2022-10-21 MED ORDER — FENTANYL CITRATE (PF) 100 MCG/2ML IJ SOLN
INTRAMUSCULAR | Status: DC | PRN
  Administered 2022-10-21 (×2): 50 ug via INTRAVENOUS
  Administered 2022-10-21: 100 ug via INTRAVENOUS

## 2022-10-21 MED ORDER — HYDROMORPHONE HCL 1 MG/ML IJ SOLN
INTRAMUSCULAR | Status: AC
  Filled 2022-10-21: qty 1

## 2022-10-21 MED ORDER — OXYCODONE HCL 5 MG PO TABS: 5.0000 mg | ORAL_TABLET | Freq: Once | ORAL | Status: AC | PRN

## 2022-10-21 MED ORDER — ONDANSETRON HCL 4 MG/2ML IJ SOLN
INTRAMUSCULAR | Status: DC | PRN
  Administered 2022-10-21: 4 mg via INTRAVENOUS

## 2022-10-21 MED ORDER — HYDRALAZINE HCL 20 MG/ML IJ SOLN: 10.0000 mg | INTRAMUSCULAR | Status: DC | PRN

## 2022-10-21 MED ORDER — PROPOFOL 10 MG/ML IV BOLUS
INTRAVENOUS | Status: AC
  Filled 2022-10-21: qty 20

## 2022-10-21 MED ORDER — PROPOFOL 10 MG/ML IV BOLUS
INTRAVENOUS | Status: DC | PRN
Start: 2022-10-21 — End: 2022-10-21
  Administered 2022-10-21: 200 mg via INTRAVENOUS

## 2022-10-21 MED ORDER — FENTANYL CITRATE PF 50 MCG/ML IJ SOSY
PREFILLED_SYRINGE | INTRAMUSCULAR | Status: AC
  Filled 2022-10-21: qty 1

## 2022-10-21 MED ORDER — AMITRIPTYLINE HCL 25 MG PO TABS: 25.0000 mg | ORAL_TABLET | Freq: Every evening | ORAL | Status: DC | PRN

## 2022-10-21 MED ORDER — GABAPENTIN 100 MG PO CAPS
100.0000 mg | ORAL_CAPSULE | Freq: Once | ORAL | Status: AC
  Administered 2022-10-21: 100 mg via ORAL
  Filled 2022-10-21: qty 1

## 2022-10-21 MED ORDER — SODIUM CHLORIDE 0.9 % IV SOLN: 12.5000 mg | Freq: Four times a day (QID) | INTRAVENOUS | Status: DC | PRN

## 2022-10-21 MED ORDER — BUPIVACAINE LIPOSOME 1.3 % IJ SUSP
INTRAMUSCULAR | Status: DC | PRN
  Administered 2022-10-21: 20 mL

## 2022-10-21 MED ORDER — PANTOPRAZOLE SODIUM 40 MG IV SOLR
40.0000 mg | Freq: Every day | INTRAVENOUS | Status: DC
  Administered 2022-10-21: 40 mg via INTRAVENOUS
  Filled 2022-10-21: qty 10

## 2022-10-21 MED ORDER — LACTATED RINGERS IV SOLN: INTRAVENOUS | Status: DC

## 2022-10-21 MED ORDER — MORPHINE SULFATE (PF) 2 MG/ML IV SOLN
1.0000 mg | INTRAVENOUS | Status: DC | PRN
  Administered 2022-10-21 (×2): 2 mg via INTRAVENOUS
  Filled 2022-10-21 (×3): qty 1

## 2022-10-21 MED ORDER — FIBRIN SEALANT 2 ML SINGLE DOSE KIT
2.0000 mL | PACK | Freq: Once | CUTANEOUS | Status: AC
  Administered 2022-10-21: 2 mL via TOPICAL
  Filled 2022-10-21: qty 2

## 2022-10-21 MED ORDER — OXYCODONE HCL 5 MG/5ML PO SOLN
5.0000 mg | Freq: Four times a day (QID) | ORAL | Status: DC | PRN
  Administered 2022-10-21 – 2022-10-22 (×4): 5 mg via ORAL
  Filled 2022-10-21 (×4): qty 5

## 2022-10-21 MED ORDER — STERILE WATER FOR IRRIGATION IR SOLN
Status: DC | PRN
  Administered 2022-10-21: 1000 mL

## 2022-10-21 MED ORDER — ACETAMINOPHEN 10 MG/ML IV SOLN: 1000.0000 mg | Freq: Once | INTRAVENOUS | Status: DC

## 2022-10-21 MED ORDER — ACETAMINOPHEN 500 MG PO TABS
1000.0000 mg | ORAL_TABLET | Freq: Three times a day (TID) | ORAL | Status: DC
  Administered 2022-10-21 – 2022-10-22 (×4): 1000 mg via ORAL
  Filled 2022-10-21 (×4): qty 2

## 2022-10-21 MED ORDER — OXYCODONE HCL 5 MG/5ML PO SOLN
ORAL | Status: AC
  Filled 2022-10-21: qty 5

## 2022-10-21 MED ORDER — SUCCINYLCHOLINE CHLORIDE 200 MG/10ML IV SOSY
PREFILLED_SYRINGE | INTRAVENOUS | Status: DC | PRN
  Administered 2022-10-21: 100 mg via INTRAVENOUS

## 2022-10-21 MED ORDER — SUCCINYLCHOLINE CHLORIDE 200 MG/10ML IV SOSY
PREFILLED_SYRINGE | INTRAVENOUS | Status: AC
  Filled 2022-10-21: qty 10

## 2022-10-21 MED ORDER — DEXAMETHASONE SODIUM PHOSPHATE 10 MG/ML IJ SOLN
INTRAMUSCULAR | Status: DC | PRN
  Administered 2022-10-21: 8 mg via INTRAVENOUS

## 2022-10-21 MED ORDER — CHLORHEXIDINE GLUCONATE 0.12 % MT SOLN
15.0000 mL | Freq: Once | OROMUCOSAL | Status: AC
  Administered 2022-10-21: 15 mL via OROMUCOSAL

## 2022-10-21 MED ORDER — ROCURONIUM BROMIDE 10 MG/ML (PF) SYRINGE
PREFILLED_SYRINGE | INTRAVENOUS | Status: AC
  Filled 2022-10-21: qty 10

## 2022-10-21 SURGICAL SUPPLY — 102 items
ANTIFOG SOL W/FOAM PAD STRL (MISCELLANEOUS) ×1
APL LAPSCP 35 DL APL RGD (MISCELLANEOUS) ×2
APL PRP STRL LF DISP 70% ISPRP (MISCELLANEOUS) ×2
APL SWBSTK 6 STRL LF DISP (MISCELLANEOUS) ×1
APPLICATOR COTTON TIP 6 STRL (MISCELLANEOUS) ×1 IMPLANT
APPLICATOR COTTON TIP 6IN STRL (MISCELLANEOUS) ×1
APPLICATOR VISTASEAL 35 (MISCELLANEOUS) ×2 IMPLANT
APPLIER CLIP ROT 13.4 12 LRG (CLIP)
APR CLP LRG 13.4X12 ROT 20 MLT (CLIP)
BAG COUNTER SPONGE SURGICOUNT (BAG) IMPLANT
BAG SPNG CNTER NS LX DISP (BAG)
BLADE EXTENDED COATED 6.5IN (ELECTRODE) IMPLANT
BLADE HEX COATED 2.75 (ELECTRODE) ×1 IMPLANT
BLADE SURG SZ11 CARB STEEL (BLADE) ×1 IMPLANT
CABLE HIGH FREQUENCY MONO STRZ (ELECTRODE) IMPLANT
CHLORAPREP W/TINT 26 (MISCELLANEOUS) ×2 IMPLANT
CLIP APPLIE ROT 13.4 12 LRG (CLIP) IMPLANT
CLIP SUT LAPRA TY ABSORB (SUTURE) ×2 IMPLANT
COVER MAYO STAND STRL (DRAPES) IMPLANT
CUTTER FLEX LINEAR 45M (STAPLE) IMPLANT
DEVICE SUT QUICK LOAD TK 5 (SUTURE) IMPLANT
DEVICE SUT TI-KNOT TK 5X26 (SUTURE) IMPLANT
DEVICE SUTURE ENDOST 10MM (ENDOMECHANICALS) ×1 IMPLANT
DRAIN PENROSE 0.25X18 (DRAIN) ×1 IMPLANT
DRAIN PENROSE 0.5X18 (DRAIN) ×1 IMPLANT
DRAPE LAPAROSCOPIC ABDOMINAL (DRAPES) ×1 IMPLANT
DRAPE UTILITY XL STRL (DRAPES) ×1 IMPLANT
DRAPE WARM FLUID 44X44 (DRAPES) IMPLANT
DRSG TEGADERM 2-3/8X2-3/4 SM (GAUZE/BANDAGES/DRESSINGS) ×6 IMPLANT
ELECT REM PT RETURN 15FT ADLT (MISCELLANEOUS) ×1 IMPLANT
GAUZE 4X4 16PLY ~~LOC~~+RFID DBL (SPONGE) ×1 IMPLANT
GAUZE SPONGE 2X2 8PLY STRL LF (GAUZE/BANDAGES/DRESSINGS) ×1 IMPLANT
GAUZE SPONGE 4X4 12PLY STRL (GAUZE/BANDAGES/DRESSINGS) ×1 IMPLANT
GLOVE BIO SURGEON STRL SZ7.5 (GLOVE) ×1 IMPLANT
GLOVE INDICATOR 8.0 STRL GRN (GLOVE) ×1 IMPLANT
GOWN STRL REUS W/ TWL XL LVL3 (GOWN DISPOSABLE) ×4 IMPLANT
GOWN STRL REUS W/TWL XL LVL3 (GOWN DISPOSABLE) ×4
HANDLE SUCTION POOLE (INSTRUMENTS) IMPLANT
IRRIG SUCT STRYKERFLOW 2 WTIP (MISCELLANEOUS) ×1
IRRIGATION SUCT STRKRFLW 2 WTP (MISCELLANEOUS) ×1 IMPLANT
KIT BASIN OR (CUSTOM PROCEDURE TRAY) ×1 IMPLANT
KIT GASTRIC LAVAGE 34FR ADT (SET/KITS/TRAYS/PACK) ×1 IMPLANT
KIT TURNOVER KIT A (KITS) IMPLANT
MARKER SKIN DUAL TIP RULER LAB (MISCELLANEOUS) ×1 IMPLANT
MAT PREVALON FULL STRYKER (MISCELLANEOUS) ×1 IMPLANT
NDL SPNL 22GX3.5 QUINCKE BK (NEEDLE) ×1 IMPLANT
NEEDLE SPNL 22GX3.5 QUINCKE BK (NEEDLE) ×1 IMPLANT
NS IRRIG 1000ML POUR BTL (IV SOLUTION) ×1 IMPLANT
PACK CARDIOVASCULAR III (CUSTOM PROCEDURE TRAY) ×1 IMPLANT
PACK GENERAL/GYN (CUSTOM PROCEDURE TRAY) ×1 IMPLANT
RELOAD 45 VASCULAR/THIN (ENDOMECHANICALS) IMPLANT
RELOAD ENDO STITCH 2.0 (ENDOMECHANICALS) ×10
RELOAD STAPLE 45 2.5 WHT GRN (ENDOMECHANICALS) IMPLANT
RELOAD STAPLE 45 3.5 BLU ETS (ENDOMECHANICALS) IMPLANT
RELOAD STAPLE 60 2.6 WHT THN (STAPLE) ×2 IMPLANT
RELOAD STAPLE 60 3.6 BLU REG (STAPLE) ×2 IMPLANT
RELOAD STAPLE 60 3.8 GOLD REG (STAPLE) ×1 IMPLANT
RELOAD STAPLE TA45 3.5 REG BLU (ENDOMECHANICALS) IMPLANT
RELOAD STAPLER BLUE 60MM (STAPLE) ×2 IMPLANT
RELOAD STAPLER GOLD 60MM (STAPLE) ×1 IMPLANT
RELOAD STAPLER WHITE 60MM (STAPLE) ×2 IMPLANT
RELOAD SUT SNGL STCH ABSRB 2-0 (ENDOMECHANICALS) ×5 IMPLANT
RELOAD SUT SNGL STCH BLK 2-0 (ENDOMECHANICALS) ×4 IMPLANT
SCISSORS LAP 5X45 EPIX DISP (ENDOMECHANICALS) ×1 IMPLANT
SET TUBE SMOKE EVAC HIGH FLOW (TUBING) ×1 IMPLANT
SHEARS HARMONIC ACE PLUS 45CM (MISCELLANEOUS) ×1 IMPLANT
SLEEVE ADV FIXATION 12X100MM (TROCAR) ×2 IMPLANT
SLEEVE ADV FIXATION 5X100MM (TROCAR) IMPLANT
SOLUTION ANTFG W/FOAM PAD STRL (MISCELLANEOUS) ×1 IMPLANT
STAPLER ECHELON BIOABSB 60 FLE (MISCELLANEOUS) IMPLANT
STAPLER ECHELON LONG 60 440 (INSTRUMENTS) ×1 IMPLANT
STAPLER RELOAD BLUE 60MM (STAPLE) ×2
STAPLER RELOAD GOLD 60MM (STAPLE) ×1
STAPLER RELOAD WHITE 60MM (STAPLE) ×2
STAPLER VISISTAT 35W (STAPLE) ×1 IMPLANT
STRIP CLOSURE SKIN 1/2X4 (GAUZE/BANDAGES/DRESSINGS) ×1 IMPLANT
SUCTION POOLE HANDLE (INSTRUMENTS)
SURGILUBE 2OZ TUBE FLIPTOP (MISCELLANEOUS) ×1 IMPLANT
SUT MNCRL AB 4-0 PS2 18 (SUTURE) ×1 IMPLANT
SUT RELOAD ENDO STITCH 2 48X1 (ENDOMECHANICALS) ×6
SUT RELOAD ENDO STITCH 2.0 (ENDOMECHANICALS) ×4
SUT SILK 2 0 (SUTURE)
SUT SILK 2 0 SH CR/8 (SUTURE) IMPLANT
SUT SILK 2-0 18XBRD TIE 12 (SUTURE) IMPLANT
SUT SILK 3 0 (SUTURE)
SUT SILK 3 0 SH CR/8 (SUTURE) IMPLANT
SUT SILK 3-0 18XBRD TIE 12 (SUTURE) IMPLANT
SUT SURGIDAC NAB ES-9 0 48 120 (SUTURE) IMPLANT
SUT VIC AB 2-0 SH 27 (SUTURE) ×1
SUT VIC AB 2-0 SH 27X BRD (SUTURE) ×1 IMPLANT
SUT VICRYL 2 0 18 UND BR (SUTURE) IMPLANT
SUTURE RELOAD END STTCH 2 48X1 (ENDOMECHANICALS) ×6 IMPLANT
SUTURE RELOAD ENDO STITCH 2.0 (ENDOMECHANICALS) ×4 IMPLANT
SYR 20ML LL LF (SYRINGE) ×2 IMPLANT
TOWEL OR 17X26 10 PK STRL BLUE (TOWEL DISPOSABLE) ×2 IMPLANT
TOWEL OR NON WOVEN STRL DISP B (DISPOSABLE) ×1 IMPLANT
TRAY FOLEY MTR SLVR 16FR STAT (SET/KITS/TRAYS/PACK) IMPLANT
TROCAR ADV FIXATION 12X100MM (TROCAR) ×1 IMPLANT
TROCAR ADV FIXATION 5X100MM (TROCAR) ×1 IMPLANT
TROCAR XCEL NON-BLD 5MMX100MML (ENDOMECHANICALS) ×1 IMPLANT
TUBING CONNECTING 10 (TUBING) ×2 IMPLANT
YANKAUER SUCT BULB TIP NO VENT (SUCTIONS) IMPLANT

## 2022-10-21 NOTE — Op Note (Signed)
Diana Campos [id number] [birth year]. 10/21/2022  Preoperative diagnosis:  Severe obesity (BMI 43)  Essential hypertension  Chronic pain of both knees  Low HDL (under 40)  history of Takotsubo cardiomyopathy  Prediabetes  Sliding hiatal hernia   Postoperative  diagnosis:  1. same  Surgical procedure: Laparoscopic Roux-en-Y gastric bypass (ante-colic, ante-gastric) with sliding hiatal hernia repair; upper endoscopy  Surgeon: Atilano Ina, M.D. FACS  Asst.: Phylliss Blakes MD FACS  Anesthesia: General plus exparel/marcaine mix  Complications: None   EBL: Minimal   Drains: None   Disposition: PACU in good condition   Indications for procedure: 57 y.o. yo female with morbid obesity who has been unsuccessful at sustained weight loss. The patient's comorbidities are listed above. We discussed the risk and benefits of surgery including but not limited to anesthesia risk, bleeding, infection, blood clot formation, anastomotic leak, anastomotic stricture, ulcer formation, death, respiratory complications, intestinal blockage, internal hernia, gallstone formation, vitamin and nutritional deficiencies, injury to surrounding structures, failure to lose weight and mood changes.   Description of procedure: Patient is brought to the operating room and general anesthesia induced. The patient had received preoperative broad-spectrum IV antibiotics and subcutaneous heparin. The abdomen was widely sterilely prepped with Chloraprep and draped. Patient timeout was performed and correct patient and procedure confirmed. Access was obtained with a 5 mm Optiview trocar in the left upper quadrant and pneumoperitoneum established without difficulty. Under direct vision 12 mm trocars were placed laterally in the right upper quadrant, right upper quadrant midclavicular line, and to the left and above the umbilicus for the camera port. A 5 mm trocar was placed laterally in the left upper quadrant.   Exparel/marcaine mix was infiltrated in bilateral lateral abdominal walls as a TAP block for postoperative pain relief.  Patient had a few omental adhesions to the anterior abdominal wall which were taken down with harmonic scalpel. The omentum was brought into the upper abdomen and the transverse mesocolon elevated and the ligament of Treitz clearly identified. A 50 cm biliopancreatic limb was then carefully measured from the ligament of Treitz. The small intestine was divided at this point with a single firing of the white load linear stapler. A Penrose drain was sutured to the end of the Roux-en-Y limb for later identification. A 100 cm Roux-en-Y limb was then carefully measured. At this point a side-to-side anastomosis was created between the Roux limb and the end of the biliopancreatic limb. This was accomplished with a single firing of the 60 mm white load linear stapler. The common enterotomy was closed with a running 2-0 Vicryl begun at either end of the enterotomy and tied centrally.  Vistaseal tissue sealant was placed over the anastomosis. The mesenteric defect was then closed with running 2-0 silk. The omentum was then divided with the harmonic scalpel up towards the transverse colon to allow mobility of the Roux limb toward the gastric pouch. The patient was then placed in steep reversed Trendelenburg. Through a 5 mm subxiphoid site the [facility] retractor was placed and the left lobe of the liver elevated with excellent exposure of the upper stomach and hiatus.   Patient's preoperative imaging suggested a small sliding hiatal hernia.  There appeared to be a small gap at the diaphragm anteriorly.  The gastrohepatic ligament was incised with harmonic scalpel.  The right crus was identified.  I gently incised that overlying peritoneum with harmonic scalpel.  I was able to identify the confluence of the left and right crura.  There is a  small hiatal hernia between the left and right crura.  In doing some  blunt dissection along this area we had a little bit of bleeding along the posterior esophagus/proximal stomach from a small vessel.  I placed a single 2-0 silk stitch for hemostasis.  We then reapproximated the left and right crura with a single interrupted 0 Ethibond Endo Stitch secured with a titanium tie knot.  The angle of Hiss was then mobilized with the harmonic scalpel. A 5 cm gastric pouch was then carefully measured along the lesser curve of the stomach. Dissection was carried along the lesser curve at this point with the Harmonic scalpel working carefully back toward the lesser sac at right angles to the lesser curve. The free lesser sac was then entered. After being sure all tubes were removed from the stomach an initial firing of the gold load 60 mm linear stapler was fired at right angles across the lesser curve for about 4 cm. The gastric pouch was further mobilized posteriorly and then the pouch was completed with 3 further firings of the 60 mm blue load linear stapler up through the previously dissected angle of His. It was ensured that the pouch was completely mobilized away from the gastric remnant. This created a nice tubular 4-5 cm gastric pouch. The Roux limb was then brought up in an antecolic fashion with the candycane facing to the patient's left without undue tension. The gastrojejunostomy was created with an initial posterior row of 2-0 Vicryl between the Roux limb and the staple line of the gastric pouch. Enterotomies were then made in the gastric pouch and the Roux limb with the harmonic scalpel and at approximately 2-2-1/2 cm anastomosis was created with a single firing of the 60mm blue load linear stapler. The staple line was inspected and was intact.  However there was some bleeding along the staple line.  Ended up placing a single 2-0 Vicryl suture and hemostasis was achieved.  The common enterotomy was then closed with running 2-0 Vicryl begun at either end and tied centrally. The  Ewall tube was then easily passed through the anastomosis and an outer anterior layer of running 2-0 Vicryl was placed. The Ewald tube was removed. With the outlet of the gastrojejunostomy clamped and under saline irrigation the assistant performed upper endoscopy and with the gastric pouch tensely distended with air-there was no evidence of leak on this test. The pouch was desufflated. The Vonita Moss defect was closed with running 2-0 silk. The abdomen was inspected for any evidence of bleeding or bowel injury and everything looked fine. The Nathanson retractor was removed under direct vision after coating the anastomosis with Vistaseal tissue sealant. All CO2 was evacuated and trochars removed. Skin incisions were closed with 4-0 monocryl in a subcuticular fashion followed by benzoin, steri-strips and bandages. Sponge needle and instrument counts were correct. The patient was taken to the PACU in good condition.    Diana Campos. Andrey Campanile, MD, FACS General, Bariatric, & Minimally Invasive Surgery [facility], [place]

## 2022-10-21 NOTE — Progress Notes (Signed)

## 2022-10-21 NOTE — Op Note (Signed)
Preoperative diagnosis: Roux-en-Y gastric bypass  Postoperative diagnosis: Same   Procedure: Upper endoscopy   Surgeon: Berna Bue, M.D.  Anesthesia: Gen.   Description of procedure: The endoscope was placed in the mouth and oropharynx and under endoscopic vision it was advanced to the esophagogastric junction which was identified at 40cm from the teeth. No active esophagitis present. The pouch was tensely insufflated while the upper abdomen was flooded with irrigation to perform a leak test, which was negative. No bubbles were seen.  The staple line was hemostatic and the anastomosis is visibly patent and hemostatic. The pouch measures 5cm in length, fully insufflated, and there is no fundus present. The lumen was decompressed and the scope was withdrawn without difficulty.    Berna Bue, M.D. General, Bariatric, & Minimally Invasive Surgery [facility] [place]

## 2022-10-21 NOTE — Progress Notes (Signed)
   10/21/22 1319  TOC Brief Assessment  Insurance and Status Reviewed  Patient has primary care physician Yes  Home environment has been reviewed Resides with relatives  Prior level of function: Independent at baseline  Prior/Current Home Services No current home services  Social Determinants of Health Reivew SDOH reviewed no interventions necessary  Readmission risk has been reviewed Yes  Transition of care needs no transition of care needs at this time

## 2022-10-21 NOTE — Progress Notes (Signed)
Call to Dr. Andrey Campanile for pre-op orders. Left message with return #

## 2022-10-21 NOTE — Anesthesia Procedure Notes (Signed)
Procedure Name: Intubation Date/Time: 10/21/2022 7:46 AM  Performed by: Ponciano Ort, CRNAPre-anesthesia Checklist: Patient identified, Emergency Drugs available, Suction available and Patient being monitored Patient Re-evaluated:Patient Re-evaluated prior to induction Oxygen Delivery Method: Circle system utilized Preoxygenation: Pre-oxygenation with 100% oxygen Induction Type: IV induction and Rapid sequence Laryngoscope Size: Mac and 4 Grade View: Grade I Tube type: Oral Tube size: 7.0 mm Number of attempts: 1 Airway Equipment and Method: Stylet and Oral airway Placement Confirmation: ETT inserted through vocal cords under direct vision, positive ETCO2 and breath sounds checked- equal and bilateral Secured at: 21 cm Tube secured with: Tape Dental Injury: Teeth and Oropharynx as per pre-operative assessment

## 2022-10-21 NOTE — Discharge Instructions (Signed)

## 2022-10-21 NOTE — Transfer of Care (Signed)
Immediate Anesthesia Transfer of Care Note  Patient: Diana Campos  Procedure(s) Performed: LAPAROSCOPIC ROUX-EN-Y GASTRIC BYPASS WITH UPPER ENDOSCOPY HERNIA REPAIR HIATAL  Patient Location: PACU  Anesthesia Type:General  Level of Consciousness: awake, alert , oriented, and patient cooperative  Airway & Oxygen Therapy: Patient Spontanous Breathing and Patient connected to face mask oxygen  Post-op Assessment: Report given to RN and Post -op Vital signs reviewed and stable  Post vital signs: Reviewed and stable  Last Vitals:  Vitals Value Taken Time  BP 187/107 10/21/22 1015  Temp    Pulse 83 10/21/22 1018  Resp 13 10/21/22 1018  SpO2 100 % 10/21/22 1018  Vitals shown include unfiled device data.  Last Pain:  Vitals:   10/21/22 0555  TempSrc:   PainSc: 0-No pain         Complications: No notable events documented.

## 2022-10-21 NOTE — Interval H&P Note (Signed)
History and Physical Interval Note:  10/21/2022 7:29 AM  Diana Campos  has presented today for surgery, with the diagnosis of MORBID OBESITY.  The various methods of treatment have been discussed with the patient and family. After consideration of risks, benefits and other options for treatment, the patient has consented to  Procedure(s): LAPAROSCOPIC ROUX-EN-Y GASTRIC BYPASS WITH UPPER ENDOSCOPY (N/A) HERNIA REPAIR HIATAL (N/A) as a surgical intervention.  The patient's history has been reviewed, patient examined, no change in status, stable for surgery.  I have reviewed the patient's chart and labs.  Questions were answered to the patient's satisfaction.     Gaynelle Adu

## 2022-10-21 NOTE — Progress Notes (Addendum)
PHARMACY CONSULT FOR:  Risk Assessment for Post-Discharge VTE Following Bariatric Surgery  Post-Discharge VTE Risk Assessment: This patient's probability of 30-day post-discharge VTE is increased due to the factors marked: X Sleeve gastrectomy   Liver disorder (transplant, cirrhosis, or nonalcoholic steatohepatitis)   Hx of VTE   Hemorrhage requiring transfusion   GI perforation, leak, or obstruction   ====================================================    Female    Age >/=60 years    BMI >/=50 kg/m2    CHF    Dyspnea at Rest    Paraplegia   X Non-gastric-band surgery    Operation Time >/=3 hr    Return to OR     Length of Stay >/= 3 d   Hypercoagulable condition   Significant venous stasis      Predicted probability of 30-day post-discharge VTE: 0.16%  Recommendation for Discharge: No pharmacologic prophylaxis post-discharge  - Pharmacy has been consulted to convert diltiazem to IR formulation post-op. PTA diltiazem CD 120  daily  --> Recom. changing to diltiazem IR 30 mg PO q6h if/when the diltiazem is resumed back for pt.    Diana Campos is a 57 y.o. female who underwent Roux-en-Y gastric bypass on 10/21/22   Case start: 0804 Case end: 1002   No Known Allergies  Patient Measurements: Height: 5\' 8"  (172.7 cm) Weight: 129.3 kg (285 lb) IBW/kg (Calculated) : 63.9 Body mass index is 43.33 kg/m.  No results for input(s): "WBC", "HGB", "HCT", "PLT", "APTT", "CREATININE", "LABCREA", "CREAT24HRUR", "MG", "PHOS", "ALBUMIN", "PROT", "AST", "ALT", "ALKPHOS", "BILITOT", "BILIDIR", "IBILI" in the last 72 hours. Estimated Creatinine Clearance: 79.5 mL/min (A) (by C-G formula based on SCr of 1.15 mg/dL (H)).    Past Medical History:  Diagnosis Date   Acute heart failure (HCC)    Anxiety    Blood transfusion without reported diagnosis 2014   Chest pain    Myoview 9/21: EF 68, no ischemia; low risk   GERD (gastroesophageal reflux disease)    Hyperlipidemia    on meds    Hypertension    on meds   NSTEMI (non-ST elevated myocardial infarction) (HCC) 2019   Pre-diabetes    Takotsubo cardiomyopathy      Facility-Administered Medications Prior to Admission  Medication Dose Route Frequency Provider Last Rate Last Admin   0.9 %  sodium chloride infusion  500 mL Intravenous Once Nandigam, Eleonore Chiquito, MD       Medications Prior to Admission  Medication Sig Dispense Refill Last Dose   ALPRAZolam (XANAX) 0.25 MG tablet Take 1 tablet (0.25 mg total) by mouth 2 (two) times daily. (Patient taking differently: Take 0.25 mg by mouth 2 (two) times daily as needed for anxiety.) 60 tablet 2 Past Month   aspirin 81 MG chewable tablet Chew 1 tablet (81 mg total) by mouth daily.   10/20/2022   Calcium Carbonate-Vitamin D (CALCIUM 600+D PO) Take 1 tablet by mouth daily. Bariatric Calcium   10/19/2022   diltiazem (CARDIZEM CD) 120 MG 24 hr capsule Take 1 capsule (120 mg total) by mouth daily. Please make overdue appt with Dr. Tenny Craw before anymore refills. Thank you 3rd and Final Attempt 15 capsule 0 10/20/2022 at 1200   furosemide (LASIX) 20 MG tablet Take 1 tablet (20 mg total) by mouth every Monday, Wednesday, and Friday. 36 tablet 3 10/18/2022   Magnesium Hydroxide (MILK OF MAGNESIA PO) Take 1 Dose by mouth 2 (two) times a week.   10/19/2022   Multiple Vitamins-Minerals (BARIATRIC FUSION PO) Take 2 each by mouth  daily.   Past Week   pantoprazole (PROTONIX) 40 MG tablet Take 1 tablet (40 mg total) by mouth 2 (two) times daily. Take 40 mg PO BID for 3 months, followed by 40 mg once daily thereafter. 90 tablet 6 Past Week   potassium chloride (KLOR-CON) 10 MEQ tablet Take 10 mEq by mouth 3 (three) times a week.   10/18/2022   spironolactone (ALDACTONE) 25 MG tablet Take 1 tablet (25 mg total) by mouth daily. Please make overdue appt with Dr. Tenny Craw before anymore refills. Thank you 2nd attempt 15 tablet 0 10/20/2022   amitriptyline (ELAVIL) 25 MG tablet Take 1 tablet (25 mg total) by mouth  at bedtime. (Patient taking differently: Take 25 mg by mouth at bedtime as needed (interstitial cystitis).) 90 tablet 3 More than a month   estradiol (CLIMARA) 0.1 mg/24hr patch Place 1 patch (0.1 mg total) onto the skin once a week. 4 patch 12 More than a month   ibuprofen (ADVIL) 800 MG tablet Take 1 tablet (800 mg total) by mouth every 8 (eight) hours as needed. 30 tablet 5 More than a month   metroNIDAZOLE (METROGEL) 0.75 % vaginal gel Place 1 Applicatorful vaginally 2 (two) times daily. (Patient taking differently: Place 1 Applicatorful vaginally 2 (two) times daily as needed (Vaginal Irritation).) 70 g 2 More than a month   nitroGLYCERIN (NITROSTAT) 0.4 MG SL tablet PLACE 1 TABLET (0.4 MG TOTAL) UNDER THE TONGUE EVERY 5 MINUTES AS NEEDED 25 tablet 3 More than a month       [name] 10/21/2022,10:55 AM

## 2022-10-21 NOTE — H&P (Signed)
PROVIDER: Joanie Duprey Sherril Cong, MD  MRN: [id number] DOB: April 08, 1966 DATE OF ENCOUNTER: 10/03/2022 Subjective  Chief Complaint: RETURN WEIGHT LOSS   History of Present Illness: Diana Campos is a 57 y.o. female who is seen today for term follow-up regarding her severe obesity and related comorbidities.  history of NSTEMI without blockage on cath, Takotsubo cardiomyopathy, HTN, HLD  He has completed the bariatric surgery evaluation process. She has had nutritional and psychological evaluation. She decided ongoing with the gastric bypass.  She denies any shortness of breath at rest. Does have dyspnea on exertion with stairs. Is able to walk around [employer] without shortness of breath. No paroxysmal nocturnal dyspnea. No personal family history of blood clots. No venous stasis ulcers. No reflux currently. She states that she had reflux prior to her NSTEMI. Does not have to take Nexium or Tums or Prilosec. Has had a partial hysterectomy. Does have constipation.  Review of Systems: A complete review of systems was obtained from the patient. I have reviewed this information and discussed as appropriate with the patient. See HPI as well for other ROS.  ROS  Medical History: Past Medical History: Diagnosis Date Anxiety Arthritis Diabetes mellitus without complication (CMS/HHS-HCC) Hyperlipidemia Hypertension  Patient Active Problem List Diagnosis Chronic heart failure with preserved ejection fraction (CMS/HHS-HCC) Essential hypertension  Past Surgical History: Procedure Laterality Date BACK SURGERY 2010 HYSTERECTOMY 2017 PARTIAL CERVIX REMOVAL SURGERY 2019   Allergies Allergen Reactions Silver Nitrate Itching  Current Outpatient Medications on File Prior to Visit Medication Sig Dispense Refill ALPRAZolam (XANAX) 0.25 MG tablet Take by mouth amitriptyline (ELAVIL) 25 MG tablet Take 25 mg by mouth at bedtime aspirin 81 MG EC tablet Take 81 mg by mouth once daily dilTIAZem  (CARDIZEM CD) 120 MG XR capsule Take 120 mg by mouth once daily estradioL (CLIMARA) 0.1 mg/24 hr patch Place onto the skin FUROsemide (LASIX) 20 MG tablet Take by mouth nitroGLYcerin (NITROSTAT) 0.4 MG SL tablet PLACE 1 TABLET (0.4 MG TOTAL) UNDER THE TONGUE EVERY 5 MINUTES AS NEEDED potassium chloride (MICRO-K) 10 MEQ ER capsule Take 10 mEq by mouth once daily spironolactone (ALDACTONE) 25 MG tablet Take by mouth  No current facility-administered medications on file prior to visit.  History reviewed. No pertinent family history.  Social History  Tobacco Use Smoking Status Never Smokeless Tobacco Never   Social History  Socioeconomic History Marital status: Divorced Tobacco Use Smoking status: Never Smokeless tobacco: Never Substance and Sexual Activity Alcohol use: Yes Drug use: Never  Objective:  Vitals: 10/03/22 0842 10/03/22 0843 BP: (!) 152/98 Pulse: 87 Temp: 36.2 C (97.1 F) SpO2: 97% Weight: (!) 135.3 kg (298 lb 3.2 oz) Height: 172.7 cm (5\' 8" ) PainSc: 0-No pain PainLoc: Abdomen  Body mass index is 45.34 kg/m.  Gen: alert, NAD, non-toxic appearing Pupils: equal, no scleral icterus Pulm: Lungs clear to auscultation, symmetric chest rise CV: regular rate and rhythm Abd: soft, nontender, nondistended. Ext: no edema, Skin: no rash, no jaundice  Labs, Imaging and Diagnostic Testing: Labs 08/14/22 - reviewed prediabetes A1c 6.2  Upper gi 08/14/22  IMPRESSION: 1. Small hiatal hernia  2. Distal esophageal circumferential narrowing, potential early Schatzki's ring. Barium tablet passed easily. No reflux demonstrated during the course of exam.  3. Normal anatomy of the stomach and duodenum. Egd 08/22/22 grade c esophagitis; path - no hpylori, neg metaplasia; benign GE junciton with reflux changes  Myoview 12/30/19 EF 68, no ischemia, low risk  Limited echocardiogram 03/03/2018 Mild focal basal septal hypertrophy, EF 60-65, grade 1 diastolic  dysfunction  Echocardiogram 02/26/2018 Severe hypokinesis to akinesis of the apex, EF 55, grade 1 diastolic dysfunction  Cardiac catheterization 02/25/2018 Normal left main Widely patent LAD Normal circumflex Normal ramus intermedius Anteroapical akinesis/dyskinesis compatible with Takotsubo/stress cardiomyopathy Acute systolic heart failure with EF 35% and LVEDP 25 mmHg.  Reviewed a lipid panel from February 22, 2022 which was normal except for low HDL.  Reviewed comprehensive metabolic panel from feb 2024 which was normal.  Cards office note 07/18/22 "1 Hx Takotsubo's CM 2019 LVEF normal in late November 2019 DOing well No symptoms of CP   2 HTN BP is well controlled  3 HL LDL 87 HDL 34 Trig 80 Keep on lipitor Follow after surgery  4 Morbid obesity Pt currently being evaluated for bariatric surgery Frm a cardiac standpoint I think she is low risk for a major cardiac event OK to proceed  Plan for follow up next winter Will be available as needed in perioperative period " Assessment and Plan: Diagnoses and all orders for this visit:  Severe obesity (CMS/HHS-HCC)  Essential hypertension  Chronic pain of both knees  Low HDL (under 40)  history of Takotsubo cardiomyopathy  Prediabetes  Sliding hiatal hernia  Esophagitis    We reviewed her workup. We discussed the results of her upper GI and upper endoscopy. There was a question of slight skis ring and small hiatal hernia on upper GI. This triggered an upper endoscopy which showed some grade C esophagitis. Interestingly she denies any heartburn or reflux. But because of this we recommended gastric bypass over sleeve gastrectomy. Moreover her cardiology assessment did not find anything prohibitive.  We rediscussed the typical hospitalization. We rediscussed the typical issues that we see after surgery. We discussed the findings of a small sliding hiatal hernia on imaging. We discussed that we would test for 1  intraoperatively. I discussed the anatomy of the hiatus. I discussed how we would be repaired if found to have a technically significant 1. We rediscussed some of the complications that can occur with wound gastric bypass. She read over the surgical consent form and I answered her questions. We rediscussed the postoperative risk of blood clots and the importance of ambulation. We discussed the typical things that we see after surgery. We discussed the importance of the preoperative meal plan. All of her questions were asked and answered  This patient encounter took 25 minutes today to perform the following: take history, perform exam, review outside records, interpret imaging, counsel the patient on their diagnosis and document encounter, findings & plan in the EHR  No follow-ups on file.  Mary Sella. Andrey Campanile MD FACS General, Minimally Invasive, & Bariatric Surgery Electronically signed by Gara Kroner, MD at 10/03/2022 12:45 PM EDT

## 2022-10-21 NOTE — Anesthesia Postprocedure Evaluation (Signed)
Anesthesia Post Note  Patient: Diana Campos  Procedure(s) Performed: LAPAROSCOPIC ROUX-EN-Y GASTRIC BYPASS WITH UPPER ENDOSCOPY HERNIA REPAIR HIATAL     Patient location during evaluation: PACU Anesthesia Type: General Level of consciousness: awake and alert Pain management: pain level controlled Vital Signs Assessment: post-procedure vital signs reviewed and stable Respiratory status: spontaneous breathing, nonlabored ventilation and respiratory function stable Cardiovascular status: stable and blood pressure returned to baseline Anesthetic complications: no   No notable events documented.  Last Vitals:  Vitals:   10/21/22 1200 10/21/22 1237  BP: (!) 166/107 (!) 163/98  Pulse: 78 78  Resp: 16 16  Temp:  36.4 C  SpO2: 99% 95%    Last Pain:  Vitals:   10/21/22 1237  TempSrc: Oral  PainSc:                  Beryle Lathe

## 2022-10-22 ENCOUNTER — Encounter (HOSPITAL_COMMUNITY): Payer: Self-pay | Admitting: General Surgery

## 2022-10-22 LAB — CBC WITH DIFFERENTIAL/PLATELET
Abs Immature Granulocytes: 0.04 10*3/uL (ref 0.00–0.07)
Basophils Absolute: 0 10*3/uL (ref 0.0–0.1)
Basophils Relative: 0 %
Eosinophils Absolute: 0 10*3/uL (ref 0.0–0.5)
Eosinophils Relative: 0 %
HCT: 44.3 % (ref 36.0–46.0)
Hemoglobin: 14.6 g/dL (ref 12.0–15.0)
Immature Granulocytes: 0 %
Lymphocytes Relative: 15 %
Lymphs Abs: 1.7 10*3/uL (ref 0.7–4.0)
MCH: 29.6 pg (ref 26.0–34.0)
MCHC: 33 g/dL (ref 30.0–36.0)
MCV: 89.9 fL (ref 80.0–100.0)
Monocytes Absolute: 0.4 10*3/uL (ref 0.1–1.0)
Monocytes Relative: 4 %
Neutro Abs: 9 10*3/uL — ABNORMAL HIGH (ref 1.7–7.7)
Neutrophils Relative %: 81 %
Platelets: 343 10*3/uL (ref 150–400)
RBC: 4.93 MIL/uL (ref 3.87–5.11)
RDW: 13.3 % (ref 11.5–15.5)
WBC: 11.1 10*3/uL — ABNORMAL HIGH (ref 4.0–10.5)
nRBC: 0 % (ref 0.0–0.2)

## 2022-10-22 LAB — COMPREHENSIVE METABOLIC PANEL
ALT: 39 U/L (ref 0–44)
AST: 41 U/L (ref 15–41)
Albumin: 4.1 g/dL (ref 3.5–5.0)
Alkaline Phosphatase: 60 U/L (ref 38–126)
Anion gap: 8 (ref 5–15)
BUN: 13 mg/dL (ref 6–20)
CO2: 23 mmol/L (ref 22–32)
Calcium: 9.1 mg/dL (ref 8.9–10.3)
Chloride: 99 mmol/L (ref 98–111)
Creatinine, Ser: 0.93 mg/dL (ref 0.44–1.00)
GFR, Estimated: >60 mL/min (ref >60)
Glucose, Bld: 152 mg/dL — ABNORMAL HIGH (ref 70–99)
Potassium: 4.3 mmol/L (ref 3.5–5.1)
Sodium: 130 mmol/L — ABNORMAL LOW (ref 135–145)
Total Bilirubin: 0.5 mg/dL (ref 0.3–1.2)
Total Protein: 8.2 g/dL — ABNORMAL HIGH (ref 6.5–8.1)

## 2022-10-22 MED ORDER — OXYCODONE HCL 5 MG PO TABS: 5.0000 mg | ORAL_TABLET | Freq: Four times a day (QID) | ORAL | 0 refills | Status: DC | PRN

## 2022-10-22 MED ORDER — DILTIAZEM HCL 30 MG PO TABS: 30.0000 mg | ORAL_TABLET | Freq: Four times a day (QID) | ORAL | 2 refills | Status: DC

## 2022-10-22 MED ORDER — ONDANSETRON 4 MG PO TBDP: 4.0000 mg | ORAL_TABLET | Freq: Four times a day (QID) | ORAL | 0 refills | Status: DC | PRN

## 2022-10-22 MED ORDER — GABAPENTIN 100 MG PO CAPS: 200.0000 mg | ORAL_CAPSULE | Freq: Two times a day (BID) | ORAL | 0 refills | Status: DC

## 2022-10-22 MED ORDER — ACETAMINOPHEN 500 MG PO TABS: 1000.0000 mg | ORAL_TABLET | Freq: Three times a day (TID) | ORAL | Status: AC

## 2022-10-22 NOTE — Progress Notes (Signed)
Patient provided with discharged education, patient verbalized understanding. IV removed.

## 2022-10-22 NOTE — Discharge Summary (Signed)
Physician Discharge Summary  Diana Campos VWU:[id number] DOB: Sep 07, 1965 DOA: 10/21/2022  PCP: Salli Real, MD  Admit date: 10/21/2022 Discharge date: 10/22/2022  Recommendations for Outpatient Follow-up:     Follow-up Information     Gaynelle Adu, MD Follow up on 11/13/2022.   Specialty: General Surgery Why: Please arrive 15 minutes prior to your appointment at [facility] information: [address] [place] [address] [phone]         Reine Just, [place] Follow up on 12/19/2022.   Specialty: General Surgery Why: Please arrive 15 minutes prior to your appointment at 9am with Herbert Pun on behalf of Dr. Lanier Prude information: [address] [facility] [place] [address] [phone]                Discharge Diagnoses:  Principal Problem:   S/P gastric bypass Severe obesity (BMI 43)  Essential hypertension  Chronic pain of both knees  Low HDL (under 40)  history of Takotsubo cardiomyopathy  Prediabetes  Sliding hiatal hernia   Surgical Procedure: Laparoscopic Roux-en-Y gastric bypass with sliding hiatal hernia repair, upper endoscopy  Discharge Condition: Good Disposition: Home  Diet recommendation: Postoperative gastric bypass diet  Filed Weights   10/21/22 0547  Weight: 129.3 kg     Hospital Course:  The patient was admitted for a planned laparoscopic Roux-en-Y gastric bypass. Please see operative note. Preoperatively the patient was given 5000 units of subcutaneous heparin for DVT prophylaxis. ERAS protocol was used. Postoperative prophylactic heparin dosing was started on the evening of postoperative day 0.  The patient was started on ice chips and water on the evening of POD 0 which they tolerated. On postoperative day 1 The patient's diet was advanced to protein shakes which they also tolerated. On POD1, The patient was ambulating without difficulty. Their vital signs are stable without  fever or tachycardia. Their hemoglobin had remained stable. We changed her extended release cardizem to immediate release. The patient had received discharge instructions and counseling. They were deemed stable for discharge.  BP 110/64 (BP Location: Right Arm)   Pulse (!) 58   Temp 97.8 F (36.6 C) (Oral)   Resp 16   Ht 5\' 8"  (1.727 m)   Wt 129.3 kg   SpO2 96%   BMI 43.33 kg/m   Gen: alert, NAD, non-toxic appearing Pupils: equal, no scleral icterus Pulm: Lungs clear to auscultation, symmetric chest rise CV: regular rate and rhythm Abd: soft, min tender, nondistended. No cellulitis. No incisional hernia Ext: no edema, no calf tenderness Skin: no rash, no jaundice  Discharge Instructions  Discharge Instructions     Ambulate hourly while awake   Complete by: As directed    Call MD for:  difficulty breathing, headache or visual disturbances   Complete by: As directed    Call MD for:  persistant dizziness or light-headedness   Complete by: As directed    Call MD for:  persistant nausea and vomiting   Complete by: As directed    Call MD for:  redness, tenderness, or signs of infection (pain, swelling, redness, odor or green/yellow discharge around incision site)   Complete by: As directed    Call MD for:  severe uncontrolled pain   Complete by: As directed    Call MD for:  temperature >101 F   Complete by: As directed    Diet bariatric full liquid   Complete by: As directed    Discharge instructions  Complete by: As directed    See bariatric discharge instructions   Incentive spirometry   Complete by: As directed    Perform hourly while awake      Allergies as of 10/22/2022   No Known Allergies      Medication List     STOP taking these medications    aspirin 81 MG chewable tablet   diltiazem 120 MG 24 hr capsule Commonly known as: CARDIZEM CD   ibuprofen 800 MG tablet Commonly known as: ADVIL       TAKE these medications    acetaminophen 500 MG  tablet Commonly known as: TYLENOL Take 2 tablets (1,000 mg total) by mouth every 8 (eight) hours for 5 days.   ALPRAZolam 0.25 MG tablet Commonly known as: Xanax Take 1 tablet (0.25 mg total) by mouth 2 (two) times daily. What changed:  when to take this reasons to take this   amitriptyline 25 MG tablet Commonly known as: ELAVIL Take 1 tablet (25 mg total) by mouth at bedtime. What changed:  when to take this reasons to take this   BARIATRIC FUSION PO Take 2 each by mouth daily.   CALCIUM 600+D PO Take 1 tablet by mouth daily. Bariatric Calcium   diltiazem 30 MG tablet Commonly known as: CARDIZEM Take 1 tablet (30 mg total) by mouth every 6 (six) hours.   estradiol 0.1 mg/24hr patch Commonly known as: [place] Place 1 patch (0.1 mg total) onto the skin once a week.   furosemide 20 MG tablet Commonly known as: LASIX Take 1 tablet (20 mg total) by mouth every Monday, Wednesday, and Friday. Notes to patient: Monitor Blood Pressure Daily and keep a log for primary care physician.  Monitor for symptoms of dehydration.  Your physician may need to make changes to your medications with rapid weight loss.      gabapentin 100 MG capsule Commonly known as: NEURONTIN Take 2 capsules (200 mg total) by mouth every 12 (twelve) hours.   metroNIDAZOLE 0.75 % vaginal gel Commonly known as: METROGEL Place 1 Applicatorful vaginally 2 (two) times daily. What changed:  when to take this reasons to take this   MILK OF MAGNESIA PO Take 1 Dose by mouth 2 (two) times a week.   nitroGLYCERIN 0.4 MG SL tablet Commonly known as: NITROSTAT PLACE 1 TABLET (0.4 MG TOTAL) UNDER THE TONGUE EVERY 5 MINUTES AS NEEDED   ondansetron 4 MG disintegrating tablet Commonly known as: ZOFRAN-ODT Take 1 tablet (4 mg total) by mouth every 6 (six) hours as needed for nausea or vomiting.   oxyCODONE 5 MG immediate release tablet Commonly known as: Oxy IR/ROXICODONE Take 1 tablet (5 mg total) by mouth  every 6 (six) hours as needed for severe pain.   pantoprazole 40 MG tablet Commonly known as: PROTONIX Take 1 tablet (40 mg total) by mouth 2 (two) times daily. Take 40 mg PO BID for 3 months, followed by 40 mg once daily thereafter.   potassium chloride 10 MEQ tablet Commonly known as: KLOR-CON Take 10 mEq by mouth 3 (three) times a week.   spironolactone 25 MG tablet Commonly known as: ALDACTONE Take 1 tablet (25 mg total) by mouth daily. Please make overdue appt with Dr. Tenny Craw before anymore refills. Thank you 2nd attempt        Follow-up Information     Gaynelle Adu, MD Follow up on 11/13/2022.   Specialty: General Surgery Why: Please arrive 15 minutes prior to your appointment at 11am Contact information:  [address] [place] [address] [phone]         Reine Just, PA-C Follow up on 12/19/2022.   Specialty: General Surgery Why: Please arrive 15 minutes prior to your appointment at 9am with Herbert Pun on behalf of Dr. Lanier Prude information: [address] [facility] [place] [address] [phone]                  The results of significant diagnostics from this hospitalization (including imaging, microbiology, ancillary and laboratory) are listed below for reference.    Significant Diagnostic Studies: No results found.  Labs: Basic Metabolic Panel: Recent Labs  Lab 10/15/22 1423 10/22/22 0429  NA 137 130*  K 4.1 4.3  CL 102 99  CO2 26 23  GLUCOSE 90 152*  BUN 19 13  CREATININE 1.15* 0.93  CALCIUM 9.6 9.1   Liver Function Tests: Recent Labs  Lab 10/15/22 1423 10/22/22 0429  AST 26 41  ALT 26 39  ALKPHOS 62 60  BILITOT 0.6 0.5  PROT 8.5* 8.2*  ALBUMIN 4.5 4.1    CBC: Recent Labs  Lab 10/15/22 1423 10/21/22 1432 10/22/22 0429  WBC 7.1  --  11.1*  NEUTROABS  --   --  9.0*  HGB 15.0 14.5 14.6  HCT 46.5* 45.3 44.3  MCV 91.7  --  89.9  PLT 337  --  343     CBG: No results for input(s): "GLUCAP" in the last 168 hours.  Principal Problem:   S/P gastric bypass   Time coordinating discharge: 15 min  Signed:  Atilano Ina, MD [facility] [employer] [phone] 10/22/2022, 1:27 PM

## 2022-10-22 NOTE — Progress Notes (Signed)
Patient alert and oriented, pain is controlled. Patient is tolerating fluids, advanced to protein shake today, patient is tolerating well. Reviewed Gastric sleeve/bypass discharge instructions with patient and patient is able to articulate understanding. Provided information on BELT program, Support Group, BSTOP-D, and [facility] outpatient pharmacy. Communicated general update of patient status to surgeon. All questions answered. 24hr fluid recall is 380 mL per hydration protocol, bariatric nurse coordinator to make follow-up phone call within one week.    Thank you,  Lubertha Basque, RN, MSN Bariatric Nurse Coordinator [phone] (office)

## 2022-10-22 NOTE — Progress Notes (Signed)
Mobility Specialist - Progress Note   10/22/22 1249  Mobility  Activity Ambulated with assistance in hallway  Level of Assistance Standby assist, set-up cues, supervision of patient - no hands on  Assistive Device None  Distance Ambulated (ft) 200 ft  Range of Motion/Exercises Active  Activity Response Tolerated well  Mobility Referral Yes  $Mobility charge 1 Mobility  Mobility Specialist Start Time (ACUTE ONLY) 1240  Mobility Specialist Stop Time (ACUTE ONLY) 1249  Mobility Specialist Time Calculation (min) (ACUTE ONLY) 9 min   Pt received in bed and agreed to mobility, pt was Min A for bed mobility. Some pain in abdomen, returned to bed with all needs met.  Marilynne Halsted Mobility Specialist

## 2022-10-24 ENCOUNTER — Telehealth (HOSPITAL_COMMUNITY): Payer: Self-pay | Admitting: *Deleted

## 2022-10-24 NOTE — Telephone Encounter (Signed)
Left message

## 2022-10-28 ENCOUNTER — Telehealth: Payer: Self-pay | Admitting: Skilled Nursing Facility1

## 2022-10-28 NOTE — Telephone Encounter (Signed)
Pt called with a question.  Wonders if B12 should be taken due to her feeling a little sluggish after a day of doing more.   Dietitian advised if she met her protein goals and fluid goals and listening to her body when she needed to rest she is recovering from a major surgery so she will feel a little sluggish and the extra B12 will not help.

## 2022-10-30 ENCOUNTER — Telehealth (HOSPITAL_COMMUNITY): Payer: Self-pay | Admitting: *Deleted

## 2022-10-30 NOTE — Telephone Encounter (Signed)
Mailbox is full unable to leave message Post-op follow op additional attempt

## 2022-11-01 ENCOUNTER — Other Ambulatory Visit: Payer: Self-pay | Admitting: Physician Assistant

## 2022-11-05 ENCOUNTER — Encounter: Payer: 59 | Attending: General Surgery | Admitting: Skilled Nursing Facility1

## 2022-11-06 ENCOUNTER — Encounter: Payer: Self-pay | Admitting: Skilled Nursing Facility1

## 2022-11-06 NOTE — Progress Notes (Signed)
2 Week Post-Operative Nutrition Class   Patient was seen on 11/06/2022 for Post-Operative Nutrition education at the [facility].   Surgery date: 10/21/2022 Surgery type: RYGB Start weight at [facility]: 304 Weight today: 273.3 Bowel Habits: Every day to every other day no complaints   Body Composition Scale 11/06/2022  Current Body Weight 273.3  Total Body Fat % 46.1  Visceral Fat 17  Fat-Free Mass % 53.8   Total Body Water % 41.4  Muscle-Mass lbs 33.6  BMI 41.6  Body Fat Displacement          Torso  lbs 78.3         Left Leg  lbs 15.6         Right Leg  lbs 15.6         Left Arm  lbs 7.8         Right Arm  lbs 7.8      The following the learning objectives were met by the patient during this course: Identifies Soft Prepped Plan Advancement Guide  Identifies Soft, High Proteins (Phase 1), beginning 2 weeks post-operatively to 3 weeks post-operatively Identifies Additional Soft High Proteins, soft non-starchy vegetables, fruits and starches (Phase 2), beginning 3 weeks post-operatively to 3 months post-operatively Identifies appropriate sources of fluids, proteins, vegetables, fruits and starches Identifies appropriate fat sources and healthy verses unhealthy fat types   States protein, vegetable, fruit and starch recommendations and appropriate sources post-operatively Identifies the need for appropriate texture modifications, mastication, and bite sizes when consuming solids Identifies appropriate fat consumption and sources Identifies appropriate multivitamin and calcium sources post-operatively Describes the need for physical activity post-operatively and will follow MD recommendations States when to call healthcare provider regarding medication questions or post-operative complications   Handouts given during class include: Soft Prepped Plan Advancement Guide   Follow-Up Plan: Patient will follow-up at [facility] in 10 weeks for 3 month post-op  nutrition visit for diet advancement per MD.

## 2022-11-13 ENCOUNTER — Other Ambulatory Visit: Payer: Self-pay | Admitting: General Surgery

## 2022-11-13 ENCOUNTER — Ambulatory Visit (INDEPENDENT_AMBULATORY_CARE_PROVIDER_SITE_OTHER): Payer: 59

## 2022-11-13 VITALS — BP 123/84 | HR 62 | Temp 97.5°F | Resp 17 | Ht 68.0 in | Wt 271.4 lb

## 2022-11-13 DIAGNOSIS — E860 Dehydration: Secondary | ICD-10-CM

## 2022-11-13 MED ORDER — SODIUM CHLORIDE 0.9 % IV BOLUS
1000.0000 mL | Freq: Once | INTRAVENOUS | Status: AC
  Administered 2022-11-13: 1000 mL via INTRAVENOUS
  Filled 2022-11-13: qty 1000

## 2022-11-13 NOTE — Progress Notes (Signed)
Diagnosis: Dehydration  Provider:  Chilton Greathouse MD  Procedure: IV Infusion  IV Type: Peripheral, IV Location: L Antecubital  Normal Saline, Dose: 1000 ml  Infusion Start Time: 1412  Infusion Stop Time: 1519  Post Infusion IV Care: Patient declined observation. PIV removed  Discharge: Condition: Good, Destination: Home . AVS Declined  Performed by:  Rico Ala, LPN

## 2022-11-14 ENCOUNTER — Telehealth: Payer: Self-pay | Admitting: Dietician

## 2022-11-14 NOTE — Telephone Encounter (Signed)
RD called pt to verify fluid intake once starting soft, solid proteins 2 week post-bariatric surgery.   Daily Fluid intake:  Daily Protein intake:  Bowel Habits:   Concerns/issues:    Left Voice Message 

## 2022-12-02 ENCOUNTER — Other Ambulatory Visit: Payer: Self-pay | Admitting: Physician Assistant

## 2022-12-19 ENCOUNTER — Other Ambulatory Visit: Payer: Self-pay

## 2022-12-19 ENCOUNTER — Ambulatory Visit (INDEPENDENT_AMBULATORY_CARE_PROVIDER_SITE_OTHER): Payer: 59

## 2022-12-19 DIAGNOSIS — E860 Dehydration: Secondary | ICD-10-CM

## 2022-12-19 MED ORDER — SODIUM CHLORIDE 0.9 % IV BOLUS
1000.0000 mL | Freq: Once | INTRAVENOUS | Status: AC
  Administered 2022-12-19: 1000 mL via INTRAVENOUS
  Filled 2022-12-19: qty 1000

## 2022-12-19 NOTE — Progress Notes (Signed)
Diagnosis: Dehydration  Provider:  Chilton Greathouse MD  Procedure: IV Infusion  IV Type: Peripheral, IV Location: L Antecubital  Normal Saline, Dose: 1000 ml  Infusion Start Time: 1445  Infusion Stop Time: 1551  Post Infusion IV Care: Peripheral IV Discontinued  Discharge: Condition: Good, Destination: Home . AVS Declined  Performed by:  Nat Math, RN

## 2022-12-20 ENCOUNTER — Other Ambulatory Visit: Payer: Self-pay | Admitting: Pharmacy Technician

## 2022-12-20 ENCOUNTER — Ambulatory Visit (INDEPENDENT_AMBULATORY_CARE_PROVIDER_SITE_OTHER): Payer: 59 | Admitting: *Deleted

## 2022-12-20 ENCOUNTER — Other Ambulatory Visit: Payer: Self-pay | Admitting: Student

## 2022-12-20 ENCOUNTER — Other Ambulatory Visit (HOSPITAL_COMMUNITY)
Admission: RE | Admit: 2022-12-20 | Discharge: 2022-12-20 | Disposition: A | Discharge status: Home / Self Care | Payer: 59 | Source: Ambulatory Visit | Attending: Obstetrics and Gynecology | Admitting: Obstetrics and Gynecology

## 2022-12-20 VITALS — BP 109/73 | HR 78 | Wt 256.8 lb

## 2022-12-20 DIAGNOSIS — Z113 Encounter for screening for infections with a predominantly sexual mode of transmission: Secondary | ICD-10-CM | POA: Insufficient documentation

## 2022-12-20 DIAGNOSIS — R1310 Dysphagia, unspecified: Secondary | ICD-10-CM

## 2022-12-20 NOTE — Progress Notes (Signed)
SUBJECTIVE:  57 y.o. female who desires a STI screen. Denies abnormal vaginal discharge, or bleeding. Reports pain in lower abdomen following sex. No UTI symptoms. Denies history of known exposure to STD.  No LMP recorded. Patient has had a hysterectomy.  OBJECTIVE:  She appears well.   ASSESSMENT:  STI Screen   PLAN:  Pt offered STI blood screening-requested GC, chlamydia, and trichomonas probe sent to lab.  Treatment: To be determined once lab results are received.  Pt follow up as needed. Advised pt to make provider appt to discuss lower abdomen pain following sex. Pt verbalized understanding.

## 2022-12-21 LAB — HIV ANTIBODY (ROUTINE TESTING W REFLEX): HIV Screen 4th Generation wRfx: NONREACTIVE

## 2022-12-21 LAB — HEPATITIS B SURFACE ANTIGEN: Hepatitis B Surface Ag: NEGATIVE

## 2022-12-21 LAB — HEPATITIS C ANTIBODY: Hep C Virus Ab: NONREACTIVE

## 2022-12-21 LAB — RPR: RPR Ser Ql: NONREACTIVE

## 2022-12-23 ENCOUNTER — Other Ambulatory Visit: Payer: Self-pay

## 2022-12-23 LAB — CERVICOVAGINAL ANCILLARY ONLY
Bacterial Vaginitis (gardnerella): NEGATIVE
Candida Glabrata: NEGATIVE
Candida Vaginitis: NEGATIVE
Chlamydia: NEGATIVE
Comment: NEGATIVE
Comment: NEGATIVE
Comment: NEGATIVE
Comment: NEGATIVE
Comment: NEGATIVE
Comment: NORMAL
Neisseria Gonorrhea: NEGATIVE
Trichomonas: NEGATIVE

## 2022-12-26 ENCOUNTER — Ambulatory Visit (INDEPENDENT_AMBULATORY_CARE_PROVIDER_SITE_OTHER): Payer: 59

## 2022-12-26 VITALS — BP 129/79 | HR 60 | Temp 98.1°F | Resp 16 | Ht 68.0 in | Wt 258.8 lb

## 2022-12-26 DIAGNOSIS — E860 Dehydration: Secondary | ICD-10-CM | POA: Diagnosis not present

## 2022-12-26 MED ORDER — SODIUM CHLORIDE 0.9 % IV BOLUS
2000.0000 mL | Freq: Once | INTRAVENOUS | Status: AC
  Administered 2022-12-26: 2000 mL via INTRAVENOUS
  Filled 2022-12-26: qty 2000

## 2022-12-26 NOTE — Progress Notes (Signed)
Diagnosis: Dehydration  Provider:  Chilton Greathouse MD  Procedure: IV Infusion  IV Type: Peripheral, IV Location: L Antecubital  Normal Saline, Dose: 2000 mL  Infusion Start Time: 1355  Infusion Stop Time: 1610  Post Infusion IV Care: Patient declined observation and Peripheral IV Discontinued  Discharge: Condition: Good, Destination: Home . AVS Declined  Performed by:  Wyvonne Lenz, RN

## 2023-01-02 ENCOUNTER — Ambulatory Visit
Admission: RE | Admit: 2023-01-02 | Discharge: 2023-01-02 | Disposition: A | Discharge status: Home / Self Care | Payer: 59 | Source: Ambulatory Visit | Attending: Student | Admitting: Student

## 2023-01-02 ENCOUNTER — Other Ambulatory Visit: Payer: Self-pay | Admitting: Student

## 2023-01-02 DIAGNOSIS — R1310 Dysphagia, unspecified: Secondary | ICD-10-CM

## 2023-01-09 ENCOUNTER — Ambulatory Visit: Payer: 59

## 2023-01-09 ENCOUNTER — Encounter: Payer: Self-pay | Admitting: Pulmonary Disease

## 2023-01-09 VITALS — BP 136/83 | HR 73 | Temp 97.4°F | Resp 18 | Ht 68.0 in | Wt 256.2 lb

## 2023-01-09 DIAGNOSIS — E860 Dehydration: Secondary | ICD-10-CM

## 2023-01-09 MED ORDER — SODIUM CHLORIDE 0.9 % IV BOLUS
1000.0000 mL | Freq: Once | INTRAVENOUS | Status: AC
  Administered 2023-01-09: 1000 mL via INTRAVENOUS
  Filled 2023-01-09: qty 1000

## 2023-01-09 NOTE — Progress Notes (Signed)
Diagnosis: Dehydration  Provider:  Chilton Greathouse MD  Procedure: IV Infusion  IV Type: Peripheral, IV Location: L Antecubital  Normal Saline, Dose: 1000 ml  Infusion Start Time: 0821  Infusion Stop Time: 0933  Post Infusion IV Care: Peripheral IV Discontinued  Discharge: Condition: Good, Destination: Home . AVS Declined  Performed by:  Marlow Baars [place], RN

## 2023-01-16 ENCOUNTER — Ambulatory Visit: Payer: 59 | Admitting: Nurse Practitioner

## 2023-01-20 ENCOUNTER — Encounter: Payer: Self-pay | Admitting: Dietician

## 2023-01-20 ENCOUNTER — Encounter: Payer: 59 | Attending: General Surgery | Admitting: Dietician

## 2023-01-20 VITALS — Ht 68.0 in | Wt 250.0 lb

## 2023-01-20 DIAGNOSIS — E669 Obesity, unspecified: Secondary | ICD-10-CM | POA: Insufficient documentation

## 2023-01-20 NOTE — Progress Notes (Signed)
3 Month Post-Operative Nutrition Class   Patient was seen on 01/20/2023 for Post-Operative Nutrition education at the [facility].   Surgery date: 10/21/2022 Surgery type: RYGB  Start weight at [facility]: 304 Height: 68 in Weight today: 250.0 lbs Bowel Habits: Every day to every other day no complaints   Body Composition Scale 11/06/2022 01/20/2023  Current Body Weight 273.3 250.0  Total Body Fat % 46.1 44.0  Visceral Fat 17 15  Fat-Free Mass % 53.8 55.9   Total Body Water % 41.4 42.4  Muscle-Mass lbs 33.6 33.4  BMI 41.6 38.0  Body Fat Displacement           Torso  lbs 78.3 68.2         Left Leg  lbs 15.6 13.6         Right Leg  lbs 15.6 13.6         Left Arm  lbs 7.8 6.8         Right Arm  lbs 7.8 6.8    The following the learning objectives were met by the patient during this course: Information Reviewed/ Discussed Review of composition scale numbers Fluid requirements (64-100 ounces) Protein requirements (60-80g) Strategies for tolerating diet Barriers to inclusion of new foods Inclusion of appropriate multivitamin and calcium supplements  Exercise recommendations  Identifies appropriate sources of fluids, proteins, vegetables, fruits and starches Identifies appropriate fat sources and healthy verses unhealthy fat types   States protein, vegetable, fruit and starch recommendations and appropriate sources post-operatively Identifies the need for appropriate texture modifications, mastication, and bite sizes when consuming solids Identifies appropriate fat consumption and sources Identifies appropriate multivitamin and calcium sources post-operatively Describes the need for physical activity post-operatively and will follow MD recommendations   Handouts given during class include: Standard Prep Plan Advancement Guide   Follow-Up Plan: Patient will follow-up at [facility] in February for next nutrition visit.

## 2023-01-21 ENCOUNTER — Ambulatory Visit: Payer: 59 | Admitting: Dietician

## 2023-02-04 ENCOUNTER — Ambulatory Visit: Payer: 59 | Admitting: Skilled Nursing Facility1

## 2023-02-18 ENCOUNTER — Other Ambulatory Visit: Payer: Self-pay | Admitting: Obstetrics

## 2023-02-18 DIAGNOSIS — N951 Menopausal and female climacteric states: Secondary | ICD-10-CM

## 2023-02-19 NOTE — Progress Notes (Unsigned)
Cardiology Office Note    Patient Name: Diana Campos Date of Encounter: 02/19/2023  Primary Care Provider:  Salli Real, MD Primary Cardiologist:  Dietrich Pates, MD Primary Electrophysiologist: None   Past Medical History    Past Medical History:  Diagnosis Date   Acute heart failure (HCC)    Anxiety    Blood transfusion without reported diagnosis 2014   Chest pain    Myoview 9/21: EF 68, no ischemia; low risk   GERD (gastroesophageal reflux disease)    Hyperlipidemia    on meds   Hypertension    on meds   NSTEMI (non-ST elevated myocardial infarction) (HCC) 2019   Pre-diabetes    Takotsubo cardiomyopathy     History of Present Illness  Diana Campos is a 57 y.o. female with a PMH of CAD s/p NSTEMI with LHC with normal coronaries and Takotsubo CM 02/2018, HTN, HLD, anxiety, obesity HFpEF who presents today for 4-month follow-up.`   Diana Campos was seen initially in 2019 when she presented with complaint of chest pain and found to have NSTEMI by troponin at 2.81.  She underwent LHC that showed normal coronaries and EF was reduced at 35% by visual estimate suspicious for Takotsubo cardiomyopathy.  She underwent a limited echo 03/03/2018 that showed improved EF of 60 to 65% with grade 1 DD and normal LV function.  She was started on beta-blocker and low-dose amlodipine but developed lower extremity edema and amlodipine was discontinued.  She was seen in follow-up in 12/2019 with complaint of substernal chest pain reminiscent of previous discomfort.  She was started on Imdur and Myoview was completed that was low risk without ischemia.  She had improvement with her lower extremity swelling with Lasix and has rare episodes of chest pain that are relieved with as needed nitroglycerin.  She was last seen by Dr. Tenny Craw on 07/18/2022 for 4 follow-up.  During visit patient reported doing well and was planning to be evaluated for bariatric surgery.  She was well-controlled and was provided clearance for  procedure.  She underwent a laparoscopic Roux-en-Y gastric bypass with hiatal hernia repair on 10/21/2022.  She had no interval ED visits since previous follow-up.  Diana Campos presents today for 5-month follow-up.  She reports no recurrence of chest pain since she last visit in April. She has been off isosorbide, a medication previously used for chest pain control, and has not had any issues since discontinuing it. She underwent gastric bypass surgery in July and has been doing well post-operatively. She stopped taking spironolactone, a medication for blood pressure control, right before the surgery and has been maintaining stable blood pressure since then. The patient's other medication, Cardizem, was switched around the time of the surgery due to changes in her stomach size. She has been managing her blood pressure through diet, exercise, and weight loss. She has also been exercising regularly, using a row machine, a walk pad, and a foot pedal device. She also uses a vibration plate, which she finds beneficial. The patient has not had any issues with swelling or lower extremity edema. She takes Lasix as needed for swelling management.  Patient denies chest pain, palpitations, dyspnea, PND, orthopnea, nausea, vomiting, dizziness, syncope, edema, weight gain, or early satiety.   Review of Systems  Please see the history of present illness.    All other systems reviewed and are otherwise negative except as noted above.  Physical Exam    Wt Readings from Last 3 Encounters:  01/20/23 250 lb (  113.4 kg)  01/09/23 256 lb 3.2 oz (116.2 kg)  12/26/22 258 lb 12.8 oz (117.4 kg)   QM:VHQIO were no vitals filed for this visit.,There is no height or weight on file to calculate BMI. GEN: Well nourished, well developed in no acute distress Neck: No JVD; No carotid bruits Pulmonary: Clear to auscultation without rales, wheezing or rhonchi  Cardiovascular: Normal rate. Regular rhythm. Normal S1. Normal S2.    Murmurs: There is no murmur.  ABDOMEN: Soft, non-tender, non-distended EXTREMITIES:  No edema; No deformity   EKG/LABS/ Recent Cardiac Studies   ECG personally reviewed by me today -none completed today  Risk Assessment/Calculations:          Lab Results  Component Value Date   WBC 11.1 (H) 10/22/2022   HGB 14.6 10/22/2022   HCT 44.3 10/22/2022   MCV 89.9 10/22/2022   PLT 343 10/22/2022   Lab Results  Component Value Date   CREATININE 0.93 10/22/2022   BUN 13 10/22/2022   NA 130 (L) 10/22/2022   K 4.3 10/22/2022   CL 99 10/22/2022   CO2 23 10/22/2022   Lab Results  Component Value Date   CHOL 137 02/22/2022   HDL 34 (L) 02/22/2022   LDLCALC 87 02/22/2022   TRIG 80 02/22/2022   CHOLHDL 4.0 02/22/2022    Lab Results  Component Value Date   HGBA1C 5.9 (H) 07/21/2014   Assessment & Plan    1.  HFpEF: -2019 with EF of 60-65% and no RWMA with diastolic dysfunction and normal wall motion -Patient is euvolemic on examination with trace lower extremity edema present bilaterally -Change Lasix to as needed. -Advise use of compression socks and elevation of feet when sitting. -If Lasix use increases, consider changing to every other day regimen. -Low sodium diet, fluid restriction <2L, and daily weights encouraged. Educated to contact our office for weight gain of 2 lbs overnight or 5 lbs in one week.   2.  Takotsubo cardiomyopathy: -Patient's LV function currently WNL per 2D echo 2019 with EF of 60 to 65% -Currently asymptomatic with no reports of chest pain  3.  Essential hypertension: -Patient's blood pressure today was stable at 122/80 -Stable blood pressure off Cardizem and Spironolactone. Discussed the benefits of weight loss, diet, and exercise in maintaining blood pressure control. -Discontinue Cardizem. -Monitor blood pressure at home and log for two weeks. -Contact office with blood pressure log results. -Consider restarting Spironolactone if blood pressure  increases.  4.  Hyperlipidemia: -Patient's last LDL cholesterol was was 87 -Continue lifestyle modification and diet control -Please increase physical activity to at least 150 minutes/week  5.  Morbid obesity: -s/p gastric bypass procedure 10/2022 -Patient's current BMI is 37.95 kg/m  Disposition: Follow-up with Dietrich Pates, MD or APP in 6 months   Signed, Napoleon Form, Leodis Rains, NP 02/19/2023, 7:30 PM  [facility]

## 2023-02-20 ENCOUNTER — Encounter: Payer: Self-pay | Admitting: Nurse Practitioner

## 2023-02-20 ENCOUNTER — Ambulatory Visit: Payer: 59 | Attending: Nurse Practitioner | Admitting: Nurse Practitioner

## 2023-02-20 VITALS — BP 122/82 | HR 59 | Ht 68.0 in | Wt 249.6 lb

## 2023-02-20 DIAGNOSIS — I5181 Takotsubo syndrome: Secondary | ICD-10-CM | POA: Diagnosis not present

## 2023-02-20 DIAGNOSIS — I10 Essential (primary) hypertension: Secondary | ICD-10-CM | POA: Diagnosis not present

## 2023-02-20 DIAGNOSIS — E7800 Pure hypercholesterolemia, unspecified: Secondary | ICD-10-CM

## 2023-02-20 DIAGNOSIS — I5032 Chronic diastolic (congestive) heart failure: Secondary | ICD-10-CM | POA: Diagnosis not present

## 2023-02-20 MED ORDER — FUROSEMIDE 20 MG PO TABS: 20.0000 mg | ORAL_TABLET | Freq: Every day | ORAL | 1 refills | Status: DC | PRN

## 2023-02-20 NOTE — Patient Instructions (Addendum)
Medication Instructions:  STOP Cardizem 30mg   DECREASE Lasix to 20mg  as needed *If you need a refill on your cardiac medications before your next appointment, please call your pharmacy*   Lab Work: None ordered   Testing/Procedures: None ordered   Follow-Up: At [facility], you and your health needs are our priority.  As part of our continuing mission to provide you with exceptional heart care, we have created designated Provider Care Teams.  These Care Teams include your primary Cardiologist (physician) and Advanced Practice Providers (APPs -  Physician Assistants and Nurse Practitioners) who all work together to provide you with the care you need, when you need it.  We recommend signing up for the patient portal called "MyChart".  Sign up information is provided on this After Visit Summary.  MyChart is used to connect with patients for Virtual Visits (Telemedicine).  Patients are able to view lab/test results, encounter notes, upcoming appointments, etc.  Non-urgent messages can be sent to your provider as well.   To learn more about what you can do with MyChart, go to [email or web address].    Your next appointment:   6 month(s)  Provider:   Tereso Newcomer, PA-C or Zonia Kief, NP     Other Instructions Check your blood pressure daily for 2 weeks, then contact the office with your readings.  Make sure to check 2 hours after your medications.   AVOID these things for 30 minutes before checking your blood pressure: No Drinking caffeine. No Drinking alcohol. No Eating. No Smoking. No Exercising.  Five minutes before checking your blood pressure: Pee. Sit in a dining chair. Avoid sitting in a soft couch or armchair. Be quiet. Do not talk.

## 2023-03-21 ENCOUNTER — Other Ambulatory Visit: Payer: Self-pay | Admitting: Family Medicine

## 2023-03-21 DIAGNOSIS — N951 Menopausal and female climacteric states: Secondary | ICD-10-CM

## 2023-04-03 ENCOUNTER — Other Ambulatory Visit: Payer: Self-pay | Admitting: Obstetrics

## 2023-04-03 DIAGNOSIS — B9689 Other specified bacterial agents as the cause of diseases classified elsewhere: Secondary | ICD-10-CM

## 2023-04-21 ENCOUNTER — Other Ambulatory Visit: Payer: Self-pay | Admitting: Nurse Practitioner

## 2023-05-20 ENCOUNTER — Ambulatory Visit: Payer: 59

## 2023-06-16 ENCOUNTER — Encounter: Payer: Self-pay | Admitting: Dietician

## 2023-06-16 ENCOUNTER — Encounter: Attending: General Surgery | Admitting: Dietician

## 2023-06-16 DIAGNOSIS — E669 Obesity, unspecified: Secondary | ICD-10-CM | POA: Insufficient documentation

## 2023-06-16 NOTE — Progress Notes (Signed)
 Bariatric Nutrition Follow-Up Visit Medical Nutrition Therapy  Appt Start Time: 0819   End Time: 0852  Virtual Visit via Video Note  I connected with Diana Campos on 06/16/23 by a video enabled telemedicine application and verified that I am speaking with the correct person using two identifiers. I discussed the limitations of evaluation and management by telemedicine and the availability of in person appointments. The patient expressed understanding and agreed to proceed.  Surgery date: 10/21/2022 Surgery type: RYGB  NUTRITION ASSESSMENT  Anthropometrics  Start weight at [facility]: 304 Height: 68 in Weight today: pt reports between 236-240 lbs. Bowel Habits: Every day to every other day no complaints   Clinical   Medical hx: myocardial infarction, HTN, anemia, anxiety, obesity, hypercholesterolemia Medications: lasix, aspirin, xanax, diltiazem, K, estritol patch, spironolactone Labs:   Notable signs/symptoms: none noted Any previous deficiencies? No   Body Composition Scale 11/06/2022 01/20/2023  Current Body Weight 273.3 250.0  Total Body Fat % 46.1 44.0  Visceral Fat 17 15  Fat-Free Mass % 53.8 55.9   Total Body Water % 41.4 42.4  Muscle-Mass lbs 33.6 33.4  BMI 41.6 38.0  Body Fat Displacement           Torso  lbs 78.3 68.2         Left Leg  lbs 15.6 13.6         Right Leg  lbs 15.6 13.6         Left Arm  lbs 7.8 6.8         Right Arm  lbs 7.8 6.8     Lifestyle & Dietary Hx  Pt reports weight is fluctuating between 236-240 lb. Pt states she has a body comp scale. Pt reports weight: 239.9 lbs Pt reports body fat: 47.8 % Pt reports visceral fat: 17 Pt reports body water: 35.7 % Pt states she gets about 24 grams of protein from a protein shake, and the rest of her protein from fish or boiled egg. Pt states she still does not tolerate other proteins.  Estimated daily fluid intake: 34 oz Estimated daily protein intake: not tracking protein... 24 grams of protein from a  protein shake, and the rest of her protein from fish Supplements: multivitamin and calcium Current average weekly physical activity: walking treadmill, squats on vibration plate.  24-Hr Dietary Recall First Meal: part of a protein drink,  Snack: pineapple with grapes  Second Meal: buffalo lunch meat with cheese Snack: sf jello, fruit or peanuts with sf chocolate chips Third Meal: fish Snack:  Beverages: water, un-sweet tea  Post-Op Goals/ Signs/ Symptoms Using straws: yes Drinking while eating: no Chewing/swallowing difficulties: no Changes in vision: no Changes to mood/headaches: no Hair loss/changes to skin/nails: no Difficulty focusing/concentrating: no Sweating: no Limb weakness: no Dizziness/lightheadedness: no Palpitations: no  Carbonated/caffeinated beverages: no N/V/D/C/Gas: a little constipation sometimes (pt states she is using milk of magnesia) Abdominal pain: no Dumping syndrome: no   NUTRITION DIAGNOSIS  Overweight/obesity ([place]-3.3) related to past poor dietary habits and physical inactivity as evidenced by completed bariatric surgery and following dietary guidelines for continued weight loss and healthy nutrition status.   NUTRITION INTERVENTION Nutrition counseling (C-1) and education (E-2) to facilitate bariatric surgery goals, including: The importance of consuming adequate calories as well as certain nutrients daily due to the body's need for essential vitamins, minerals, and fats The importance of daily physical activity and to reach a goal of at least 150 minutes of moderate to vigorous physical activity weekly (or  as directed by their physician) due to benefits such as increased musculature and improved lab values The importance of intuitive eating specifically learning hunger-satiety cues and understanding the importance of learning a new body: The importance of mindful eating to avoid grazing behaviors  Encouraged patient to honor their body's internal  hunger and fullness cues.  Throughout the day, check in mentally and rate hunger. Stop eating when satisfied not full regardless of how much food is left on the plate.  Get more if still hungry 20-30 minutes later.  The key is to honor satisfaction so throughout the meal, rate fullness factor and stop when comfortably satisfied not physically full. The key is to honor hunger and fullness without any feelings of guilt or shame.  Pay attention to what the internal cues are, rather than any external factors. This will enhance the confidence you have in listening to your own body and following those internal cues enabling you to increase how often you eat when you are hungry not out of appetite and stop when you are satisfied not full.  Encouraged pt to continue to eat balanced meals inclusive of non starchy vegetables 2 times a day 7 days a week Encouraged pt to continue to drink a minium 64 fluid ounces with half being plain water to satisfy proper hydration    Goals Increase non-starchy vegetables Increase fluid intake; aim for 64 ounces Track your protein; aim for 60 grams per day. Increase physical activity; check out the BELT program  Handouts Provided Include  Maintenance Healthy Eating Guide (6 months to Maintenance)... emailed Bariatric MyPlate... emailed  Learning Style & Readiness for Change Teaching method utilized: Visual & Auditory  Demonstrated degree of understanding via: Teach Back  Readiness Level: preparation Barriers to learning/adherence to lifestyle change: tolerance to some proteins  RD's Notes for Next Visit Assess adherence to pt chosen goals  MONITORING & EVALUATION Dietary intake, weekly physical activity, body weight.  Next Steps Patient is to follow-up in June.

## 2023-07-23 ENCOUNTER — Other Ambulatory Visit: Payer: Self-pay | Admitting: Obstetrics

## 2023-07-23 DIAGNOSIS — N951 Menopausal and female climacteric states: Secondary | ICD-10-CM

## 2023-08-20 ENCOUNTER — Other Ambulatory Visit: Payer: Self-pay | Admitting: General Surgery

## 2023-08-20 DIAGNOSIS — Z9884 Bariatric surgery status: Secondary | ICD-10-CM

## 2023-08-20 DIAGNOSIS — R1011 Right upper quadrant pain: Secondary | ICD-10-CM

## 2023-08-21 ENCOUNTER — Ambulatory Visit
Admission: RE | Admit: 2023-08-21 | Discharge: 2023-08-21 | Discharge status: Home / Self Care | Source: Ambulatory Visit | Attending: General Surgery | Admitting: General Surgery

## 2023-08-21 ENCOUNTER — Ambulatory Visit: Payer: Self-pay | Admitting: General Surgery

## 2023-08-21 DIAGNOSIS — R1011 Right upper quadrant pain: Secondary | ICD-10-CM

## 2023-08-21 DIAGNOSIS — Z9884 Bariatric surgery status: Secondary | ICD-10-CM

## 2023-08-21 NOTE — Progress Notes (Signed)
 I called the the patient to discuss her right upper quadrant ReSound.  Since she has right sided pain rating to her back I think this is what is causing her pain.  I think she is symptomatic cholelithiasis.  We discussed the diagnosis.  We reviewed the ultrasound.  I recommended laparoscopic cholecystectomy with cholangiogram.  I believe the patient's symptoms are consistent with gallbladder disease.  We rediscussed gallbladder disease.  We discussed non-operative and operative management. We rediscussed the signs & symptoms of acute cholecystitis  I discussed laparoscopic cholecystectomy with IOC in detail.  .  We discussed the risks and benefits of a laparoscopic cholecystectomy including, but not limited to bleeding, infection, injury to surrounding structures such as the intestine or liver, bile leak, retained gallstones, need to convert to an open procedure, prolonged diarrhea, blood clots such as  DVT, common bile duct injury, anesthesia risks, and possible need for additional procedures.  We discussed the typical post-operative recovery course. I explained that the likelihood of improvement of their symptoms is good.  Patient wishes to proceed with surgery.  Told patient our office would reach out to her to schedule surgery.  Patient voiced understanding

## 2023-11-19 ENCOUNTER — Encounter: Payer: Self-pay | Admitting: Obstetrics and Gynecology

## 2023-11-19 ENCOUNTER — Ambulatory Visit (INDEPENDENT_AMBULATORY_CARE_PROVIDER_SITE_OTHER): Admitting: Obstetrics and Gynecology

## 2023-11-19 ENCOUNTER — Other Ambulatory Visit (HOSPITAL_COMMUNITY)
Admission: RE | Admit: 2023-11-19 | Discharge: 2023-11-19 | Disposition: A | Discharge status: Home / Self Care | Source: Ambulatory Visit | Attending: Obstetrics and Gynecology | Admitting: Obstetrics and Gynecology

## 2023-11-19 VITALS — BP 122/80 | HR 58 | Ht 68.0 in | Wt 234.0 lb

## 2023-11-19 DIAGNOSIS — Z1331 Encounter for screening for depression: Secondary | ICD-10-CM

## 2023-11-19 DIAGNOSIS — Z113 Encounter for screening for infections with a predominantly sexual mode of transmission: Secondary | ICD-10-CM | POA: Insufficient documentation

## 2023-11-19 DIAGNOSIS — F419 Anxiety disorder, unspecified: Secondary | ICD-10-CM

## 2023-11-19 DIAGNOSIS — Z7689 Persons encountering health services in other specified circumstances: Secondary | ICD-10-CM

## 2023-11-19 DIAGNOSIS — N951 Menopausal and female climacteric states: Secondary | ICD-10-CM

## 2023-11-19 DIAGNOSIS — F32A Depression, unspecified: Secondary | ICD-10-CM | POA: Diagnosis not present

## 2023-11-19 DIAGNOSIS — Z1231 Encounter for screening mammogram for malignant neoplasm of breast: Secondary | ICD-10-CM

## 2023-11-19 DIAGNOSIS — Z01419 Encounter for gynecological examination (general) (routine) without abnormal findings: Secondary | ICD-10-CM | POA: Diagnosis not present

## 2023-11-19 LAB — POCT URINALYSIS DIPSTICK
Bilirubin, UA: NEGATIVE
Blood, UA: NEGATIVE
Glucose, UA: NEGATIVE
Ketones, UA: NEGATIVE
Leukocytes, UA: NEGATIVE
Nitrite, UA: NEGATIVE
Protein, UA: POSITIVE — AB
Spec Grav, UA: 1.015 (ref 1.010–1.025)
Urobilinogen, UA: 0.2 U/dL
pH, UA: 7 (ref 5.0–8.0)

## 2023-11-19 MED ORDER — ESTRADIOL 0.1 MG/24HR TD PTWK: 0.1000 mg | MEDICATED_PATCH | TRANSDERMAL | 11 refills | Status: AC

## 2023-11-19 NOTE — Progress Notes (Signed)
 GYNECOLOGY ANNUAL PREVENTATIVE CARE ENCOUNTER NOTE  History:     Diana Campos is a 58 y.o. [phone] female here for a routine annual gynecologic exam.  Current complaints: recurrent vasomotor symptoms.   Denies abnormal vaginal bleeding, discharge, pelvic pain, problems with intercourse or other gynecologic concerns.    Gynecologic History No LMP recorded. Patient has had a hysterectomy. Contraception: status post hysterectomy Last Pap: 2015. Results were: normal with negative HPV Last mammogram: 2/24. Results were: normal  Obstetric History OB History  Gravida Para Term Preterm AB Living  4 3   1 3   SAB IAB Ectopic Multiple Live Births   1   3    # Outcome Date GA Lbr Len/2nd Weight Sex Type Anes PTL Lv  4 Para 12/27/88    M Vag-Spont   LIV  3 Para 09/19/86    M Vag-Spont   LIV  2 IAB 04/08/82          1 Para 02/06/82    F Vag-Spont   LIV    Past Medical History:  Diagnosis Date   Acute heart failure (HCC)    Anxiety    Blood transfusion without reported diagnosis 2014   Chest pain    Myoview  9/21: EF 68, no ischemia; low risk   GERD (gastroesophageal reflux disease)    Hyperlipidemia    on meds   Hypertension    on meds   NSTEMI (non-ST elevated myocardial infarction) (HCC) 2019   Pre-diabetes    Takotsubo cardiomyopathy     Past Surgical History:  Procedure Laterality Date   ABDOMINAL HYSTERECTOMY  2016   BACK SURGERY  2002   CARDIAC CATHETERIZATION  02/17/2018   COLONOSCOPY  2007   ? normal per pt- done due to eating lead and nacho chips   COLONOSCOPY  2021   KN-MAC-prep good-tics/int hems   COLPORRHAPHY  10/24/2014   Posterior, with uterosacral ligament suspension   GASTRIC ROUX-EN-Y N/A 10/21/2022   Procedure: LAPAROSCOPIC ROUX-EN-Y GASTRIC BYPASS WITH UPPER ENDOSCOPY;  Surgeon: Tanda Locus, MD;  Location: [facility];  Service: General;  Laterality: N/A;   HIATAL HERNIA REPAIR N/A 10/21/2022   Procedure: HERNIA REPAIR HIATAL;  Surgeon: Tanda Locus, MD;   Location: [facility];  Service: General;  Laterality: N/A;   LEFT HEART CATH AND CORONARY ANGIOGRAPHY N/A 02/25/2018   Procedure: LEFT HEART CATH AND CORONARY ANGIOGRAPHY;  Surgeon: Claudene Victory ORN, MD;  Location: [facility] INVASIVE CV LAB;  Service: Cardiovascular;  Laterality: N/A;   TRACHELECTOMY  10/24/2014   removed cervix   TUBAL LIGATION  1990   ULTRASOUND GUIDANCE FOR VASCULAR ACCESS  02/25/2018   Procedure: Ultrasound Guidance For Vascular Access;  Surgeon: Claudene Victory ORN, MD;  Location: [facility] INVASIVE CV LAB;  Service: Cardiovascular;;    Current Outpatient Medications on File Prior to Visit  Medication Sig Dispense Refill   ALPRAZolam  (XANAX ) 0.25 MG tablet Take 1 tablet (0.25 mg total) by mouth 2 (two) times daily. (Patient taking differently: Take 0.25 mg by mouth 2 (two) times daily as needed for anxiety.) 60 tablet 2   Calcium  Carbonate-Vitamin D (CALCIUM  600+D PO) Take 1 tablet by mouth daily. Bariatric Calcium      furosemide  (LASIX ) 20 MG tablet TAKE 1 TABLET BY MOUTH ONCE DAILY AS NEEDED 30 tablet 0   metroNIDAZOLE  (METROGEL ) 0.75 % vaginal gel INSERT ONE APPLICATORFUL VAGINALLY TWICE DAILY 70 g 0   Multiple Vitamins-Minerals (BARIATRIC FUSION PO) Take 2 each by mouth daily.  potassium chloride  (KLOR-CON ) 10 MEQ tablet Take 1 tablet (10 mEq total) by mouth 3 (three) times a week. 45 tablet 2   amitriptyline  (ELAVIL ) 25 MG tablet Take 1 tablet (25 mg total) by mouth at bedtime. (Patient taking differently: Take 25 mg by mouth at bedtime as needed (interstitial cystitis).) 90 tablet 3   No current facility-administered medications on file prior to visit.    No Known Allergies  Social History:  reports that she has never smoked. She has never been exposed to tobacco smoke. She has never used smokeless tobacco. She reports that she does not currently use alcohol. She reports that she does not use drugs.  Family History  Problem Relation Age of Onset   Hypertension Mother    Rectal  cancer Brother 45   Colon cancer Brother 38   Cancer Paternal Grandmother    Colon polyps Neg Hx    Esophageal cancer Neg Hx    Stomach cancer Neg Hx     The following portions of the patient's history were reviewed and updated as appropriate: allergies, current medications, past family history, past medical history, past social history, past surgical history and problem list.  Review of Systems Pertinent items noted in HPI and remainder of comprehensive ROS otherwise negative.  Physical Exam:  BP 122/80   Pulse (!) 58   Ht 5' 8 (1.727 m)   Wt 234 lb (106.1 kg)   BMI 35.58 kg/m  CONSTITUTIONAL: Well-developed, obese,  well-nourished female in no acute distress.  HENT:  Normocephalic, atraumatic, External right and left ear normal. Oropharynx is clear and moist EYES: Conjunctivae and EOM are normal.  NECK: Normal range of motion, supple, no masses.  Normal thyroid.  SKIN: Skin is warm and dry. No rash noted. Not diaphoretic. No erythema. No pallor. MUSCULOSKELETAL: Normal range of motion. No tenderness.  No cyanosis, clubbing, or edema.  2+ distal pulses. NEUROLOGIC: Alert and oriented to person, place, and time. Normal reflexes, muscle tone coordination.  PSYCHIATRIC: Normal mood and affect. Normal behavior. Normal judgment and thought content. CARDIOVASCULAR: Normal heart rate noted, regular rhythm RESPIRATORY: Clear to auscultation bilaterally. Effort and breath sounds normal, no problems with respiration noted. BREASTS: Symmetric in size. No masses, tenderness, skin changes, nipple drainage, or lymphadenopathy bilaterally. Performed in the presence of a chaperone. ABDOMEN: Soft, no distention noted.  No tenderness, rebound or guarding.  PELVIC: Normal appearing external genitalia and urethral meatus; normal appearing vaginal mucosa and cervix.  No abnormal discharge noted.  Absent uterus noted. No adnexal tenderness.  Performed in the presence of a chaperone.   Assessment and  Plan:    1. Anxiety and depression Pt advised to follow up with PCP or Dr. Rudy regarding xanax  prescriptions  2. Women's annual routine gynecological examination (Primary) Normal annual exam  - POCT Urinalysis Dipstick  3. Routine screening for STI (sexually transmitted infection) Per patient request  - Cervicovaginal ancillary only( [place]) - Hepatitis B Surface AntiGEN - Hepatitis C Antibody - RPR - HIV antibody (with reflex) - POCT Urinalysis Dipstick  4. Visit for screening mammogram  - MM 3D SCREENING MAMMOGRAM BILATERAL BREAST; Future  5. Hot flashes due to menopause Pt was concerned regarding suboptimal response to HRT, reviewed meds, pt is on highest dose , pt informed and she requested  rx - estradiol  (CLIMARA  - DOSED IN MG/24 HR) 0.1 mg/24hr patch; Place 1 patch (0.1 mg total) onto the skin once a week.  Dispense: 4 patch; Refill: 11  6. Encounter  to establish care Will refer to internal medicine to establish care for non gyn issues - Ambulatory referral to Internal Medicine  Will follow up results of pap smear and manage accordingly. Mammogram scheduled Routine preventative health maintenance measures emphasized. Please refer to After Visit Summary for other counseling recommendations.      Jerilynn Buddle, MD, FACOG Obstetrician & Gynecologist, [facility] for [facility], [facility]

## 2023-11-19 NOTE — Progress Notes (Signed)
 Pt presents for annual exam. Requests all STI testing. Would like refill on Xanax . Declines behavioral health referral PHQ-9 score 5; GAD-7 score 5. No questions or concerns today.

## 2023-11-21 LAB — CERVICOVAGINAL ANCILLARY ONLY
Bacterial Vaginitis (gardnerella): POSITIVE — AB
Candida Glabrata: NEGATIVE
Candida Vaginitis: NEGATIVE
Chlamydia: NEGATIVE
Comment: NEGATIVE
Comment: NEGATIVE
Comment: NEGATIVE
Comment: NEGATIVE
Comment: NEGATIVE
Comment: NORMAL
Neisseria Gonorrhea: NEGATIVE
Trichomonas: NEGATIVE

## 2023-11-21 LAB — HEPATITIS B SURFACE ANTIGEN: Hepatitis B Surface Ag: NEGATIVE

## 2023-11-21 LAB — HIV ANTIBODY (ROUTINE TESTING W REFLEX): HIV Screen 4th Generation wRfx: NONREACTIVE

## 2023-11-21 LAB — HEPATITIS C ANTIBODY: Hep C Virus Ab: NONREACTIVE

## 2023-11-21 LAB — RPR: RPR Ser Ql: NONREACTIVE

## 2023-11-24 ENCOUNTER — Other Ambulatory Visit: Payer: Self-pay

## 2023-11-24 ENCOUNTER — Other Ambulatory Visit: Payer: Self-pay | Admitting: General Surgery

## 2023-11-24 DIAGNOSIS — B9689 Other specified bacterial agents as the cause of diseases classified elsewhere: Secondary | ICD-10-CM

## 2023-11-24 MED ORDER — METRONIDAZOLE 500 MG PO TABS: 500.0000 mg | ORAL_TABLET | Freq: Two times a day (BID) | ORAL | 0 refills | Status: DC

## 2023-11-24 MED ORDER — FLUCONAZOLE 150 MG PO TABS: 150.0000 mg | ORAL_TABLET | Freq: Once | ORAL | 0 refills | Status: AC

## 2023-11-25 ENCOUNTER — Ambulatory Visit: Payer: Self-pay | Admitting: Obstetrics and Gynecology

## 2023-11-27 ENCOUNTER — Encounter: Payer: Self-pay | Admitting: General Surgery

## 2023-11-27 ENCOUNTER — Ambulatory Visit (INDEPENDENT_AMBULATORY_CARE_PROVIDER_SITE_OTHER)

## 2023-11-27 VITALS — BP 124/82 | HR 76 | Temp 98.0°F | Resp 18

## 2023-11-27 DIAGNOSIS — E860 Dehydration: Secondary | ICD-10-CM | POA: Diagnosis not present

## 2023-11-27 MED ORDER — SODIUM CHLORIDE 0.9 % IV BOLUS
1000.0000 mL | Freq: Once | INTRAVENOUS | Status: AC
  Administered 2023-11-27: 1000 mL via INTRAVENOUS
  Filled 2023-11-27: qty 1000

## 2023-11-27 NOTE — Progress Notes (Signed)
 Diagnosis: Dehydration  Provider:  Praveen Mannam MD  Procedure: IV Infusion  IV Type: Peripheral, IV Location: L Antecubital  Normal Saline, Dose: 1000 ml  Infusion Start Time: 1521  Infusion Stop Time: 1628  Post Infusion IV Care: Peripheral IV Discontinued  Discharge: Condition: Good, Destination: Home . AVS Provided  Performed by:  Keiley Levey, RN

## 2023-12-04 ENCOUNTER — Other Ambulatory Visit: Payer: Self-pay

## 2023-12-04 ENCOUNTER — Ambulatory Visit

## 2023-12-04 VITALS — BP 127/80 | HR 70 | Temp 98.1°F | Resp 16 | Ht 68.0 in | Wt 235.6 lb

## 2023-12-04 DIAGNOSIS — E860 Dehydration: Secondary | ICD-10-CM

## 2023-12-04 DIAGNOSIS — B9689 Other specified bacterial agents as the cause of diseases classified elsewhere: Secondary | ICD-10-CM

## 2023-12-04 MED ORDER — METRONIDAZOLE 0.75 % VA GEL: 1.0000 | Freq: Every day | VAGINAL | 1 refills | Status: AC

## 2023-12-04 MED ORDER — SODIUM CHLORIDE 0.9 % IV BOLUS
1000.0000 mL | Freq: Once | INTRAVENOUS | Status: AC
  Administered 2023-12-04: 1000 mL via INTRAVENOUS
  Filled 2023-12-04: qty 1000

## 2023-12-04 MED ORDER — METRONIDAZOLE 500 MG PO TABS: 500.0000 mg | ORAL_TABLET | Freq: Two times a day (BID) | ORAL | 0 refills | Status: DC

## 2023-12-04 NOTE — Progress Notes (Signed)
 Diagnosis: Dehydration  Provider:  Praveen Mannam MD  Procedure: IV Infusion  IV Type: Peripheral, IV Location: L Antecubital  Normal Saline, Dose: 1000 ml  Infusion Start Time: 1512  Infusion Stop Time: 1625  Post Infusion IV Care: Peripheral IV Discontinued  Discharge: Condition: Good, Destination: Home . AVS Declined  Performed by:  Jillane Po, RN

## 2023-12-11 ENCOUNTER — Ambulatory Visit

## 2023-12-17 ENCOUNTER — Ambulatory Visit (INDEPENDENT_AMBULATORY_CARE_PROVIDER_SITE_OTHER)

## 2023-12-17 VITALS — BP 129/84 | HR 74 | Temp 97.6°F | Resp 18 | Ht 68.0 in | Wt 237.6 lb

## 2023-12-17 DIAGNOSIS — E860 Dehydration: Secondary | ICD-10-CM | POA: Diagnosis not present

## 2023-12-17 MED ORDER — SODIUM CHLORIDE 0.9 % IV BOLUS
1000.0000 mL | Freq: Once | INTRAVENOUS | Status: AC
  Administered 2023-12-17: 1000 mL via INTRAVENOUS
  Filled 2023-12-17: qty 1000

## 2023-12-17 NOTE — Progress Notes (Signed)
 Diagnosis: Dehydration   Provider:  Praveen Mannam MD  Procedure: IV Infusion  IV Type: Peripheral, IV Location: L Antecubital  Normal Saline, Dose: 1000 ml  Infusion Start Time: 1521  Infusion Stop Time: 1627  Post Infusion IV Care: Peripheral IV Discontinued  Discharge: Condition: Good, Destination: Home . AVS Declined  Performed by:  Maximiano JONELLE Pouch, LPN

## 2024-01-16 ENCOUNTER — Other Ambulatory Visit: Payer: Self-pay | Admitting: Obstetrics

## 2024-01-16 DIAGNOSIS — Z1231 Encounter for screening mammogram for malignant neoplasm of breast: Secondary | ICD-10-CM

## 2024-01-27 ENCOUNTER — Ambulatory Visit
Admission: RE | Admit: 2024-01-27 | Discharge: 2024-01-27 | Disposition: A | Discharge status: Home / Self Care | Source: Ambulatory Visit

## 2024-01-27 DIAGNOSIS — Z1231 Encounter for screening mammogram for malignant neoplasm of breast: Secondary | ICD-10-CM
